# Patient Record
Sex: Female | Born: 1935 | Race: White | Hispanic: No | Smoking: Never smoker
Health system: Southern US, Community
[De-identification: ages and names within clinical notes are randomized; demographics above are authoritative.]

## PROBLEM LIST (undated history)

## (undated) DIAGNOSIS — F419 Anxiety disorder, unspecified: Secondary | ICD-10-CM

## (undated) DIAGNOSIS — K219 Gastro-esophageal reflux disease without esophagitis: Secondary | ICD-10-CM

## (undated) DIAGNOSIS — C4491 Basal cell carcinoma of skin, unspecified: Secondary | ICD-10-CM

## (undated) DIAGNOSIS — T7840XA Allergy, unspecified, initial encounter: Secondary | ICD-10-CM

## (undated) DIAGNOSIS — M48061 Spinal stenosis, lumbar region without neurogenic claudication: Secondary | ICD-10-CM

## (undated) DIAGNOSIS — M1990 Unspecified osteoarthritis, unspecified site: Secondary | ICD-10-CM

## (undated) DIAGNOSIS — E7800 Pure hypercholesterolemia, unspecified: Secondary | ICD-10-CM

## (undated) DIAGNOSIS — I10 Essential (primary) hypertension: Secondary | ICD-10-CM

## (undated) DIAGNOSIS — E079 Disorder of thyroid, unspecified: Secondary | ICD-10-CM

## (undated) DIAGNOSIS — IMO0002 Reserved for concepts with insufficient information to code with codable children: Secondary | ICD-10-CM

## (undated) HISTORY — PX: CATARACT EXTRACTION: SUR2

## (undated) HISTORY — PX: THYROID LOBECTOMY: SHX420

## (undated) HISTORY — DX: Disorder of thyroid, unspecified: E07.9

## (undated) HISTORY — DX: Allergy, unspecified, initial encounter: T78.40XA

## (undated) HISTORY — DX: Reserved for concepts with insufficient information to code with codable children: IMO0002

## (undated) HISTORY — PX: TONSILLECTOMY: SUR1361

## (undated) HISTORY — DX: Spinal stenosis, lumbar region without neurogenic claudication: M48.061

## (undated) HISTORY — DX: Basal cell carcinoma of skin, unspecified: C44.91

## (undated) HISTORY — PX: TUBAL LIGATION: SHX77

---

## 1997-10-01 ENCOUNTER — Other Ambulatory Visit: Admission: RE | Admit: 1997-10-01 | Discharge: 1997-10-01 | Payer: Self-pay | Admitting: Gynecology

## 1998-05-14 DIAGNOSIS — I10 Essential (primary) hypertension: Secondary | ICD-10-CM

## 1998-05-14 DIAGNOSIS — E7800 Pure hypercholesterolemia, unspecified: Secondary | ICD-10-CM

## 1998-05-14 HISTORY — DX: Essential (primary) hypertension: I10

## 1998-05-14 HISTORY — DX: Pure hypercholesterolemia, unspecified: E78.00

## 1998-12-21 ENCOUNTER — Other Ambulatory Visit: Admission: RE | Admit: 1998-12-21 | Discharge: 1998-12-21 | Payer: Self-pay | Admitting: Obstetrics and Gynecology

## 1999-11-30 ENCOUNTER — Encounter: Payer: Self-pay | Admitting: Obstetrics and Gynecology

## 1999-11-30 ENCOUNTER — Encounter: Admission: RE | Admit: 1999-11-30 | Discharge: 1999-11-30 | Payer: Self-pay | Admitting: Obstetrics and Gynecology

## 1999-12-06 ENCOUNTER — Encounter: Payer: Self-pay | Admitting: Obstetrics and Gynecology

## 1999-12-06 ENCOUNTER — Encounter: Admission: RE | Admit: 1999-12-06 | Discharge: 1999-12-06 | Payer: Self-pay | Admitting: Obstetrics and Gynecology

## 2000-01-02 ENCOUNTER — Other Ambulatory Visit: Admission: RE | Admit: 2000-01-02 | Discharge: 2000-01-02 | Payer: Self-pay | Admitting: Obstetrics and Gynecology

## 2000-01-03 ENCOUNTER — Encounter: Payer: Self-pay | Admitting: Obstetrics and Gynecology

## 2000-01-03 ENCOUNTER — Encounter: Admission: RE | Admit: 2000-01-03 | Discharge: 2000-01-03 | Payer: Self-pay | Admitting: Obstetrics and Gynecology

## 2000-12-10 ENCOUNTER — Encounter: Payer: Self-pay | Admitting: Obstetrics and Gynecology

## 2000-12-10 ENCOUNTER — Encounter: Admission: RE | Admit: 2000-12-10 | Discharge: 2000-12-10 | Payer: Self-pay | Admitting: Obstetrics and Gynecology

## 2001-01-02 ENCOUNTER — Other Ambulatory Visit: Admission: RE | Admit: 2001-01-02 | Discharge: 2001-01-02 | Payer: Self-pay | Admitting: Obstetrics and Gynecology

## 2001-12-31 ENCOUNTER — Encounter: Payer: Self-pay | Admitting: Obstetrics and Gynecology

## 2001-12-31 ENCOUNTER — Encounter: Admission: RE | Admit: 2001-12-31 | Discharge: 2001-12-31 | Payer: Self-pay | Admitting: Obstetrics and Gynecology

## 2003-01-12 ENCOUNTER — Encounter: Admission: RE | Admit: 2003-01-12 | Discharge: 2003-01-12 | Payer: Self-pay | Admitting: Obstetrics and Gynecology

## 2003-01-12 ENCOUNTER — Encounter: Payer: Self-pay | Admitting: Obstetrics and Gynecology

## 2003-03-17 ENCOUNTER — Encounter: Admission: RE | Admit: 2003-03-17 | Discharge: 2003-03-17 | Payer: Self-pay | Admitting: Obstetrics and Gynecology

## 2004-02-21 ENCOUNTER — Encounter: Admission: RE | Admit: 2004-02-21 | Discharge: 2004-02-21 | Payer: Self-pay | Admitting: Obstetrics and Gynecology

## 2004-03-13 ENCOUNTER — Ambulatory Visit (HOSPITAL_COMMUNITY): Admission: RE | Admit: 2004-03-13 | Discharge: 2004-03-13 | Payer: Self-pay | Admitting: Gastroenterology

## 2004-03-13 ENCOUNTER — Encounter (INDEPENDENT_AMBULATORY_CARE_PROVIDER_SITE_OTHER): Payer: Self-pay | Admitting: Specialist

## 2005-02-02 ENCOUNTER — Other Ambulatory Visit: Admission: RE | Admit: 2005-02-02 | Discharge: 2005-02-02 | Payer: Self-pay | Admitting: Dermatology

## 2005-03-15 ENCOUNTER — Encounter: Admission: RE | Admit: 2005-03-15 | Discharge: 2005-03-15 | Payer: Self-pay | Admitting: Obstetrics and Gynecology

## 2006-04-11 ENCOUNTER — Encounter: Admission: RE | Admit: 2006-04-11 | Discharge: 2006-04-11 | Payer: Self-pay | Admitting: Obstetrics and Gynecology

## 2007-04-17 ENCOUNTER — Encounter: Admission: RE | Admit: 2007-04-17 | Discharge: 2007-04-17 | Payer: Self-pay | Admitting: Obstetrics and Gynecology

## 2008-04-20 ENCOUNTER — Encounter: Admission: RE | Admit: 2008-04-20 | Discharge: 2008-04-20 | Payer: Self-pay | Admitting: Obstetrics and Gynecology

## 2008-12-23 ENCOUNTER — Ambulatory Visit (HOSPITAL_COMMUNITY): Admission: RE | Admit: 2008-12-23 | Discharge: 2008-12-23 | Payer: Self-pay | Admitting: Ophthalmology

## 2009-04-21 ENCOUNTER — Encounter: Admission: RE | Admit: 2009-04-21 | Discharge: 2009-04-21 | Payer: Self-pay | Admitting: Obstetrics and Gynecology

## 2010-03-14 ENCOUNTER — Encounter
Admission: RE | Admit: 2010-03-14 | Discharge: 2010-05-11 | Payer: Self-pay | Source: Home / Self Care | Attending: Physical Medicine and Rehabilitation | Admitting: Physical Medicine and Rehabilitation

## 2010-04-25 ENCOUNTER — Encounter
Admission: RE | Admit: 2010-04-25 | Discharge: 2010-04-25 | Payer: Self-pay | Source: Home / Self Care | Attending: Obstetrics and Gynecology | Admitting: Obstetrics and Gynecology

## 2010-05-14 DIAGNOSIS — M48061 Spinal stenosis, lumbar region without neurogenic claudication: Secondary | ICD-10-CM

## 2010-05-14 DIAGNOSIS — K219 Gastro-esophageal reflux disease without esophagitis: Secondary | ICD-10-CM

## 2010-05-14 HISTORY — DX: Spinal stenosis, lumbar region without neurogenic claudication: M48.061

## 2010-05-14 HISTORY — DX: Gastro-esophageal reflux disease without esophagitis: K21.9

## 2010-06-04 ENCOUNTER — Encounter: Payer: Self-pay | Admitting: Obstetrics and Gynecology

## 2010-08-20 LAB — BASIC METABOLIC PANEL
Calcium: 9.7 mg/dL (ref 8.4–10.5)
GFR calc non Af Amer: >60 mL/min (ref >60)
Glucose, Bld: 92 mg/dL (ref 70–99)
Potassium: 4.2 mEq/L (ref 3.5–5.1)
Sodium: 140 mEq/L (ref 135–145)

## 2010-08-20 LAB — HEMOGLOBIN AND HEMATOCRIT, BLOOD: HCT: 40.4 % (ref 36.0–46.0)

## 2010-09-26 ENCOUNTER — Other Ambulatory Visit: Payer: Self-pay | Admitting: Orthopedic Surgery

## 2010-09-26 DIAGNOSIS — M549 Dorsalgia, unspecified: Secondary | ICD-10-CM

## 2010-09-26 DIAGNOSIS — M79604 Pain in right leg: Secondary | ICD-10-CM

## 2010-09-27 ENCOUNTER — Ambulatory Visit
Admission: RE | Admit: 2010-09-27 | Discharge: 2010-09-27 | Disposition: A | Discharge status: Home / Self Care | Payer: Medicare Other | Source: Ambulatory Visit | Attending: Orthopedic Surgery | Admitting: Orthopedic Surgery

## 2010-09-27 DIAGNOSIS — M549 Dorsalgia, unspecified: Secondary | ICD-10-CM

## 2010-09-27 DIAGNOSIS — M79604 Pain in right leg: Secondary | ICD-10-CM

## 2010-09-29 NOTE — Op Note (Signed)
NAME:  Shelly Nelson, Shelly Nelson              ACCOUNT NO.:  [id number]   MEDICAL RECORD NO.:  [id number]          PATIENT TYPE:  AMB   LOCATION:  ENDO                         FACILITY:  [facility]   PHYSICIAN:  John C. Madilyn Fireman, M.D.    DATE OF BIRTH:  22-Feb-1930   DATE OF PROCEDURE:  03/13/2004  DATE OF DISCHARGE:                                 OPERATIVE REPORT   INDICATIONS FOR PROCEDURE:  Average-risk colon cancer screening in a 77-year-  old patient.   PROCEDURE:  The patient was placed in the left lateral decubitus position  and placed on the pulse monitor with continuous low flow oxygen delivered by  nasal cannula. The patient was sedated with 75 mcg of IV fentanyl and 8 mg  IV Versed. The Olympus video colonoscope was inserted into the rectum and  advanced to the cecum, confirmed by translumination of McBurney's point and  visualization of ileocecal valve and appendiceal orifice. The prep was  suboptimal in the cecum, and I could not rule out small lesions less than 1  cm in all areas there. In the ascending colon, there was an 8-mm polyp that  was removed by snare with base fulgurated by hot biopsy. Remainder of the  ascending and transverse colon appeared normal. Within the descending and  sigmoid colon, there were seen several scattered diverticula and no other  abnormalities. The rectum appeared normal, and retroflexed view of the anus  revealed no obvious internal hemorrhoids. The scope was then withdrawn, and  the patient returned to the recovery room in stable condition. The patient  tolerated the procedure well, and there were no immediate complications.   IMPRESSION:  1.  Ascending colon polyp.  2.  Left sided diverticulosis.   PLAN:  Await histology to determine method and interval for future colon  screening.      JCH/MEDQ  D:  03/13/2004  T:  03/13/2004  Job:  [id number]   cc:   S. Kyra Manges, M.D.  [phone] [address]  [place]  [place] [address]  Fax: [phone]

## 2010-11-02 ENCOUNTER — Other Ambulatory Visit: Payer: Self-pay | Admitting: Orthopedic Surgery

## 2010-11-02 ENCOUNTER — Other Ambulatory Visit (HOSPITAL_COMMUNITY): Payer: Self-pay | Admitting: Orthopedic Surgery

## 2010-11-02 ENCOUNTER — Encounter (HOSPITAL_COMMUNITY): Payer: Medicare Other

## 2010-11-02 ENCOUNTER — Ambulatory Visit (HOSPITAL_COMMUNITY)
Admission: RE | Admit: 2010-11-02 | Discharge: 2010-11-02 | Disposition: A | Discharge status: Home / Self Care | Payer: Medicare Other | Source: Ambulatory Visit | Attending: Orthopedic Surgery | Admitting: Orthopedic Surgery

## 2010-11-02 DIAGNOSIS — Z01818 Encounter for other preprocedural examination: Secondary | ICD-10-CM

## 2010-11-02 DIAGNOSIS — Q7649 Other congenital malformations of spine, not associated with scoliosis: Secondary | ICD-10-CM | POA: Insufficient documentation

## 2010-11-02 DIAGNOSIS — M4800 Spinal stenosis, site unspecified: Secondary | ICD-10-CM | POA: Insufficient documentation

## 2010-11-02 DIAGNOSIS — Z01811 Encounter for preprocedural respiratory examination: Secondary | ICD-10-CM | POA: Insufficient documentation

## 2010-11-02 LAB — DIFFERENTIAL
Basophils Relative: 1 % (ref 0–1)
Eosinophils Absolute: 0.2 10*3/uL (ref 0.0–0.7)
Eosinophils Relative: 2 % (ref 0–5)
Lymphs Abs: 2.1 10*3/uL (ref 0.7–4.0)
Monocytes Relative: 9 % (ref 3–12)
Neutrophils Relative %: 64 % (ref 43–77)

## 2010-11-02 LAB — URINALYSIS, ROUTINE W REFLEX MICROSCOPIC
Bilirubin Urine: NEGATIVE
Ketones, ur: NEGATIVE mg/dL
Leukocytes, UA: NEGATIVE
Nitrite: NEGATIVE
Urobilinogen, UA: 1 mg/dL (ref 0.0–1.0)

## 2010-11-02 LAB — COMPREHENSIVE METABOLIC PANEL
BUN: 10 mg/dL (ref 6–23)
CO2: 28 mEq/L (ref 19–32)
Calcium: 9.7 mg/dL (ref 8.4–10.5)
Chloride: 104 mEq/L (ref 96–112)
Creatinine, Ser: 0.9 mg/dL (ref 0.50–1.10)
GFR calc Af Amer: >60 mL/min (ref >60)
GFR calc non Af Amer: >60 mL/min (ref >60)
Glucose, Bld: 122 mg/dL — ABNORMAL HIGH (ref 70–99)
Total Bilirubin: 0.4 mg/dL (ref 0.3–1.2)

## 2010-11-02 LAB — CBC
MCH: 30.3 pg (ref 26.0–34.0)
MCV: 91 fL (ref 78.0–100.0)
Platelets: 212 10*3/uL (ref 150–400)
RBC: 4.65 MIL/uL (ref 3.87–5.11)
RDW: 12.3 % (ref 11.5–15.5)
WBC: 8.5 10*3/uL (ref 4.0–10.5)

## 2010-11-02 LAB — SURGICAL PCR SCREEN
MRSA, PCR: NEGATIVE
Staphylococcus aureus: NEGATIVE

## 2010-11-08 ENCOUNTER — Ambulatory Visit (HOSPITAL_COMMUNITY): Payer: Medicare Other

## 2010-11-08 ENCOUNTER — Inpatient Hospital Stay (HOSPITAL_COMMUNITY)
Admission: RE | Admit: 2010-11-08 | Discharge: 2010-11-10 | DRG: 491 | Disposition: A | Discharge status: Home / Self Care | Payer: Medicare Other | Source: Ambulatory Visit | Attending: Orthopedic Surgery | Admitting: Orthopedic Surgery

## 2010-11-08 DIAGNOSIS — M549 Dorsalgia, unspecified: Secondary | ICD-10-CM

## 2010-11-08 DIAGNOSIS — E785 Hyperlipidemia, unspecified: Secondary | ICD-10-CM | POA: Diagnosis present

## 2010-11-08 DIAGNOSIS — M48061 Spinal stenosis, lumbar region without neurogenic claudication: Principal | ICD-10-CM | POA: Diagnosis present

## 2010-11-08 DIAGNOSIS — I10 Essential (primary) hypertension: Secondary | ICD-10-CM | POA: Diagnosis present

## 2010-11-08 DIAGNOSIS — M216X9 Other acquired deformities of unspecified foot: Secondary | ICD-10-CM | POA: Diagnosis present

## 2010-11-08 DIAGNOSIS — K219 Gastro-esophageal reflux disease without esophagitis: Secondary | ICD-10-CM | POA: Diagnosis present

## 2010-11-08 DIAGNOSIS — M5126 Other intervertebral disc displacement, lumbar region: Secondary | ICD-10-CM | POA: Diagnosis present

## 2010-11-08 DIAGNOSIS — F411 Generalized anxiety disorder: Secondary | ICD-10-CM | POA: Diagnosis present

## 2010-11-08 HISTORY — PX: BACK SURGERY: SHX140

## 2010-11-08 LAB — ABO/RH: ABO/RH(D): A POS

## 2010-11-08 LAB — TYPE AND SCREEN: ABO/RH(D): A POS

## 2010-11-08 NOTE — H&P (Addendum)
NAMEMAEGHAN, Shelly Nelson              ACCOUNT NO.:  [id number]  MEDICAL RECORD NO.:  [id number]  LOCATION:                                 FACILITY:  PHYSICIAN:  Georges Lynch. Hiroshi Krummel, M.D.DATE OF BIRTH:  April 09, 1930  DATE OF ADMISSION: DATE OF DISCHARGE:                             HISTORY & PHYSICAL   CHIEF COMPLAINTS:  Low back pain with radiation into the right leg.  BRIEF HISTORY:  Ms. Shelly Nelson has been followed by Dr. Darrelyn Hillock for worsening pain in her low back that radiates into the right leg.  She was sent for an MRI.  There are really no convincing finding on the MRI, so she was then sent for a CT myelogram that revealed severe spinal stenosis at L4-L5 as well as a central disk rupture at L5-S1 that migrates to the right which is where all her symptoms are.  She now presents for a central decompressive lumbar laminectomy at L4-L5, L5-S1 as well as a microdiskectomy at L5-S1 on the right.  MEDICATION ALLERGIES:  No known drug allergies.  CURRENT MEDICATIONS: 1. Atenolol. 2. Premarin. 3. Lipitor. 4. Calcium. 5. Folic acid. 6. Metamucil. 7. Multivitamin. 8. Hydrocodone.  PAST MEDICAL HISTORY: 1. Low back pain with radiculopathy down the right leg. 2. Anxiety. 3. Impaired vision. 4. Cataracts. 5. Hypertension. 6. Hyperlipidemia. 7. Reflux disease. 8. Hemorrhoids. 9. Urinary incontinence. 10.Cystitis. 11.History of skin cancer. 12.History of measles and mumps as a child. 13.Arthritis. 14.History of menopause.  PAST SURGICAL HISTORY: 1. Tonsillectomy in 1938. 2. Tubal ligation in 1980. 3. Partial thyroidectomy in 1983.  The patient states she has never had any trouble with anesthesia.  FAMILY HISTORY:  Father passed the age of 65, he had COPD.  Mother passed at age of 39, she had breast cancer.  SOCIAL HISTORY:  The patient is married.  She is retired.  She denies use of alcohol or tobacco products.  She has one child.  She lives at home with her husband.   She does plan to go home following her hospital stay.  REVIEW OF SYSTEMS:  GENERAL:  Negative for fevers, chills or weight change.  HEENT/NEURO:  Negative for headache or blurred vision. DERMATOLOGIC: Negative for rash or lesion. RESPIRATORY:  Negative for shortness of breath at rest or with exertion.  CARDIOVASCULAR:  Negative for chest pain or palpitations.  Most recent EKG was Oct 12, 2010.  GI: The patient admits constipation and heartburn.  GU:  Urinary incontinence.  MUSCULOSKELETAL:  Positive for back pain and morning stiffness.  PHYSICAL EXAMINATION:  VITAL SIGNS:  Pulse 84, respirations 18, blood pressure 150/90 in the left arm. GENERAL:  Ms. Shelly Nelson is alert and oriented x3.  She is well developed, well nourished, no apparent distress.  She is a pleasant 77 year old female.  She has a stated height of 5 feet 5 inches and a stated weight of 131 pounds. HEENT:  Normocephalic, atraumatic.  Extraocular movements intact.  The patient wears glasses. NECK:  Supple.  Full range of motion without lymphadenopathy. CHEST:  Lungs are clear to auscultation bilaterally without wheezes, rhonchi, or rales. HEART:  Regular rate and rhythm without murmur. ABDOMEN:  Bowel sounds present in all 4  quadrants.  Abdomen is soft, nontender to palpation. EXTREMITIES:  Evaluation of the lumbar spine reveals painful range of motion of the low back in all planes.  She has positive straight leg raise on the right. SKIN:  Unremarkable. NEUROLOGIC:  Grossly intact in lower extremities bilaterally.  Again CT myelogram results as noted previously.  IMPRESSION:  Spinal stenosis at L4-L5 and central disk rupture at L5-S1 that migrates to the right.  PLAN:  Central decompressive lumbar laminectomy at L4-L5, L5-S1 and microdiskectomy at L5-S1 on the right to be performed by Dr. Darrelyn Hillock. Ms. Shelly Nelson has been cleared for surgery by Dr. Herbie Baltimore.     Rozell Searing,  PAC   ______________________________ Georges Lynch Darrelyn Hillock, M.D.    LD/MEDQ  D:  11/05/2010  T:  11/05/2010  Job:  [id number]  Electronically Signed by Rozell Searing  on 11/08/2010 03:19:18 PM Electronically Signed by Ranee Gosselin M.D. on 11/18/2010 08:19:17 AM

## 2010-11-10 LAB — HEMOGLOBIN AND HEMATOCRIT, BLOOD
HCT: 33.2 % — ABNORMAL LOW (ref 36.0–46.0)
Hemoglobin: 11.1 g/dL — ABNORMAL LOW (ref 12.0–15.0)

## 2010-11-14 ENCOUNTER — Ambulatory Visit (HOSPITAL_COMMUNITY)
Admission: RE | Admit: 2010-11-14 | Discharge: 2010-11-14 | Disposition: A | Discharge status: Home / Self Care | Payer: Medicare Other | Source: Ambulatory Visit | Attending: Orthopedic Surgery | Admitting: Orthopedic Surgery

## 2010-11-14 DIAGNOSIS — M7989 Other specified soft tissue disorders: Secondary | ICD-10-CM

## 2010-11-14 DIAGNOSIS — R252 Cramp and spasm: Secondary | ICD-10-CM | POA: Insufficient documentation

## 2010-11-18 NOTE — Discharge Summary (Signed)
  Shelly Nelson, DURNEY NO.:  [id number]  MEDICAL RECORD NO.:  [id number]  LOCATION:  1614                         FACILITY:  [facility]  PHYSICIAN:  Georges Lynch. Moria Brophy, M.D.DATE OF BIRTH:  1929-11-14  DATE OF ADMISSION:  11/08/2010 DATE OF DISCHARGE:  11/10/2010                              DISCHARGE SUMMARY   She was taken to surgery on November 08, 2010, had decompressive lumbar laminectomy, foraminotomies for spinal stenosis at L4-5, L5-S1.  She had severe right leg pain and weakness of right foot preop.  Postop, she did extremely well.  On November 09, 2010, she was feeling much better.  She was up ambulating with a walker with assistance.  On November 10, 2010, I elected to discharge her.  Wound looked fine.  She was afebrile.  Surgical screening for bacteria were negative.  The platelet count was 212, white count 8500, hemoglobin 14.1, hematocrit 42.3 with a normal differential.  Her C metabolic was normal except glucose was slightly elevated at 1.2.  The remaining studies were within normal limits.  The INR was 0.97.  PTT was 30.  The urinalysis was within normal limits. The chest x-ray was normal.  She had lumbar spine x-rays and a myelogram were all recorded.  Her EKG was read as right superior axis deviation. There also was a question of bilateral infarct, age undetermined.  FINAL DISCHARGE DIAGNOSIS:  Spinal stenosis at L4-5, L5-S1.  CONDITION ON DISCHARGE:  Improved.  DISCHARGE MEDICATIONS:  It was written all in the discharge manager except the medicine that I discharged her home for.  It was: 1. Robaxin for muscle relaxation, she is to take one t.i.d. p.r.n. for     spasms. 2. Percocet, I gave her 40 of those 10/650 one every 4 hours p.r.n.     for pain.  DISCHARGE INSTRUCTIONS:  She is to ambulate with a walker.  She will see me in the office 2 weeks or prior if she has any problem.          ______________________________ Georges Lynch Darrelyn Hillock,  M.D.     RAG/MEDQ  D:  11/10/2010  T:  11/10/2010  Job:  [id number]  cc:   Windy Fast A. Darrelyn Hillock, M.D. Fax: [phone]  Electronically Signed by Ranee Gosselin M.D. on 11/18/2010 08:19:15 AM

## 2010-11-18 NOTE — Op Note (Signed)
NAMERANAY, KETTER NO.:  [id number]  MEDICAL RECORD NO.:  [id number]  LOCATION:  DAYL                         FACILITY:  [facility]  PHYSICIAN:  Georges Lynch. Zacharius Funari, M.D.DATE OF BIRTH:  [birth year]  DATE OF PROCEDURE: DATE OF DISCHARGE:                              OPERATIVE REPORT   SURGEON:  Ninoska Goswick A. Darrelyn Hillock, M.D.  ASSISTANT:  Marlowe Kays, M.D.  PREOPERATIVE DIAGNOSES: 1. Spinal stenosis at L4-L5. 2. Spinal stenosis at L5-S1. 3. Rule out the possibility of a soft versus herniated disk formation     at L5-S1 on the right.  All of her symptoms were on the right.  POSTOPERATIVE DIAGNOSES: 1. Spinal stenosis at L4-L5. 2. Spinal stenosis at L5-S1. 3. Rule out the possibility of a soft versus facet overgrowth.  All of her symptoms were on the right.  PREPARATION:  Under general anesthesia, the patient on spinal frame, routine orthopedic prep and drape was carried out.  She had 1 gram of IV Ancef.  At this time, the appropriate time-out was carried out in the operating room prior to any incisions.  Also, in the holding area, even though we went central, I marked the right side of her back as her symptoms were on the right lower extremity.  DESCRIPTION OF PROCEDURE:  Under general anesthesia, routine orthopedic prep and drape was carried out.  I then inserted two needles in the back for localization purposes and x-ray was taken.  Incision then was made over the L4-L5, L5-S1 interspace, then I went down and stripped the muscle from the lamina and spinous process bilaterally.  Two instruments were placed on the spinous processes at L4 and one at L5.  Another x-ray was taken.  Following that, I then began my decompressive lumbar laminectomy by removing the spinous processes of L4-L5.  Great care taken not to injure the underlying dura.  The microscope table was brought in and we completed the decompression in the usual fashion of L5- S1 and advanced  proximally to L4-L5 until we noted the canal was wide open.  We were able to easily go proximally and distally with a hockey- stick to make sure that there was no further compression proximally or distally.  We did use the microscope as I mentioned.  The dura was protected at all times.  There was marked lateral recess stenosis, especially at L5-S1.  We went ahead and decompressed the lateral recess and exposed the nerve roots and they were now free.  We able to easily pass the hockey-stick out the foramina of the roots of L-L5, L5-S1. Note, there was no soft herniated disk noted.  Following that, we, after the thorough decompression, once again we inspected the dura proximally and distally in the foramina and we as mentioned we had nice decompression.  We did try to preserve the facets at this time. Thoroughly irrigated out the wound.  I injected 10 mL of FloSeal and then removed the FloSeal from the dura, left it into the lateral facet regions and then closed the wound layers in usual fashion.  We left the small deep distal and proximal part of the wound open for drainage purposes and we closed the  muscle in the usual fashion.  We closed the subcu with 0 Vicryl and skin with metal staples.  Sterile Neosporin dressing was applied.  Estimated blood loss was about 200 mL and we had good hemostasis at the time of closure and 1 gram of IV Ancef preop.          ______________________________ Georges Lynch. Darrelyn Hillock, M.D.     RAG/MEDQ  D:  11/08/2010  T:  11/08/2010  Job:  [id number]  cc:   Windy Fast A. Darrelyn Hillock, M.D. Fax: [phone]  Landry Corporal, MD Fax: [phone]  Electronically Signed by Ranee Gosselin M.D. on 11/18/2010 08:19:20 AM

## 2010-12-22 ENCOUNTER — Other Ambulatory Visit: Payer: Self-pay | Admitting: Gastroenterology

## 2010-12-25 ENCOUNTER — Ambulatory Visit
Admission: RE | Admit: 2010-12-25 | Discharge: 2010-12-25 | Disposition: A | Discharge status: Home / Self Care | Payer: Medicare Other | Source: Ambulatory Visit | Attending: Gastroenterology | Admitting: Gastroenterology

## 2010-12-27 ENCOUNTER — Other Ambulatory Visit: Payer: Self-pay | Admitting: Gastroenterology

## 2010-12-27 DIAGNOSIS — R933 Abnormal findings on diagnostic imaging of other parts of digestive tract: Secondary | ICD-10-CM

## 2010-12-29 ENCOUNTER — Ambulatory Visit
Admission: RE | Admit: 2010-12-29 | Discharge: 2010-12-29 | Disposition: A | Discharge status: Home / Self Care | Payer: Medicare Other | Source: Ambulatory Visit | Attending: Gastroenterology | Admitting: Gastroenterology

## 2010-12-29 DIAGNOSIS — R933 Abnormal findings on diagnostic imaging of other parts of digestive tract: Secondary | ICD-10-CM

## 2011-01-29 ENCOUNTER — Other Ambulatory Visit: Payer: Self-pay | Admitting: Internal Medicine

## 2011-01-29 DIAGNOSIS — E042 Nontoxic multinodular goiter: Secondary | ICD-10-CM

## 2011-01-31 ENCOUNTER — Ambulatory Visit
Admission: RE | Admit: 2011-01-31 | Discharge: 2011-01-31 | Disposition: A | Discharge status: Home / Self Care | Payer: Medicare Other | Source: Ambulatory Visit | Attending: Internal Medicine | Admitting: Internal Medicine

## 2011-01-31 ENCOUNTER — Other Ambulatory Visit (HOSPITAL_COMMUNITY)
Admission: RE | Admit: 2011-01-31 | Discharge: 2011-01-31 | Disposition: A | Discharge status: Home / Self Care | Payer: Medicare Other | Source: Ambulatory Visit | Attending: Interventional Radiology | Admitting: Interventional Radiology

## 2011-01-31 DIAGNOSIS — E042 Nontoxic multinodular goiter: Secondary | ICD-10-CM

## 2011-01-31 DIAGNOSIS — E049 Nontoxic goiter, unspecified: Secondary | ICD-10-CM | POA: Insufficient documentation

## 2011-03-20 ENCOUNTER — Other Ambulatory Visit: Payer: Self-pay | Admitting: Family Medicine

## 2011-03-20 DIAGNOSIS — Z1231 Encounter for screening mammogram for malignant neoplasm of breast: Secondary | ICD-10-CM

## 2011-04-27 ENCOUNTER — Ambulatory Visit
Admission: RE | Admit: 2011-04-27 | Discharge: 2011-04-27 | Disposition: A | Discharge status: Home / Self Care | Payer: Medicare Other | Source: Ambulatory Visit | Attending: Family Medicine | Admitting: Family Medicine

## 2011-04-27 DIAGNOSIS — Z1231 Encounter for screening mammogram for malignant neoplasm of breast: Secondary | ICD-10-CM

## 2011-05-15 DIAGNOSIS — E079 Disorder of thyroid, unspecified: Secondary | ICD-10-CM

## 2011-05-15 HISTORY — DX: Disorder of thyroid, unspecified: E07.9

## 2012-03-24 ENCOUNTER — Other Ambulatory Visit: Payer: Self-pay | Admitting: Family Medicine

## 2012-03-24 DIAGNOSIS — Z1231 Encounter for screening mammogram for malignant neoplasm of breast: Secondary | ICD-10-CM

## 2012-05-05 ENCOUNTER — Ambulatory Visit
Admission: RE | Admit: 2012-05-05 | Discharge: 2012-05-05 | Disposition: A | Discharge status: Home / Self Care | Payer: Medicare Other | Source: Ambulatory Visit | Attending: Family Medicine | Admitting: Family Medicine

## 2012-05-05 DIAGNOSIS — Z1231 Encounter for screening mammogram for malignant neoplasm of breast: Secondary | ICD-10-CM

## 2012-05-21 ENCOUNTER — Encounter (HOSPITAL_COMMUNITY): Payer: Self-pay | Admitting: Pharmacy Technician

## 2012-06-04 ENCOUNTER — Encounter (HOSPITAL_COMMUNITY)
Admission: RE | Admit: 2012-06-04 | Discharge: 2012-06-04 | Disposition: A | Discharge status: Home / Self Care | Payer: Medicare Other | Source: Ambulatory Visit | Attending: Ophthalmology | Admitting: Ophthalmology

## 2012-06-04 ENCOUNTER — Encounter (HOSPITAL_COMMUNITY): Payer: Self-pay

## 2012-06-04 HISTORY — DX: Gastro-esophageal reflux disease without esophagitis: K21.9

## 2012-06-04 HISTORY — DX: Unspecified osteoarthritis, unspecified site: M19.90

## 2012-06-04 HISTORY — DX: Essential (primary) hypertension: I10

## 2012-06-04 HISTORY — DX: Anxiety disorder, unspecified: F41.9

## 2012-06-04 HISTORY — DX: Pure hypercholesterolemia, unspecified: E78.00

## 2012-06-04 LAB — BASIC METABOLIC PANEL
CO2: 30 mEq/L (ref 19–32)
Chloride: 103 mEq/L (ref 96–112)
Glucose, Bld: 93 mg/dL (ref 70–99)
Potassium: 4.3 mEq/L (ref 3.5–5.1)
Sodium: 140 mEq/L (ref 135–145)

## 2012-06-04 LAB — HEMOGLOBIN AND HEMATOCRIT, BLOOD: HCT: 41.4 % (ref 36.0–46.0)

## 2012-06-04 NOTE — Patient Instructions (Addendum)
Your procedure is scheduled on: 06/09/2012  Report to [facility] at  1000    AM.  Call this number if you have problems the morning of surgery: [phone]   Do not eat food or drink liquids :After Midnight.      Take these medicines the morning of surgery with A SIP OF WATER:atenolol,prilosec   Do not wear jewelry, make-up or nail polish.  Do not wear lotions, powders, or perfumes.   Do not shave 48 hours prior to surgery.  Do not bring valuables to the hospital.  Contacts, dentures or bridgework may not be worn into surgery.  Leave suitcase in the car. After surgery it may be brought to your room.  For patients admitted to the hospital, checkout time is 11:00 AM the day of discharge.   Patients discharged the day of surgery will not be allowed to drive home.  :     Please read over the following fact sheets that you were given: Coughing and Deep Breathing, Surgical Site Infection Prevention, Anesthesia Post-op Instructions and Care and Recovery After Surgery    Cataract A cataract is a clouding of the lens of the eye. When a lens becomes cloudy, vision is reduced based on the degree and nature of the clouding. Many cataracts reduce vision to some degree. Some cataracts make people more near-sighted as they develop. Other cataracts increase glare. Cataracts that are ignored and become worse can sometimes look white. The white color can be seen through the pupil. CAUSES   Aging. However, cataracts may occur at any age, even in newborns.   Certain drugs.   Trauma to the eye.   Certain diseases such as diabetes.   Specific eye diseases such as chronic inflammation inside the eye or a sudden attack of a rare form of glaucoma.   Inherited or acquired medical problems.  SYMPTOMS   Gradual, progressive drop in vision in the affected eye.   Severe, rapid visual loss. This most often happens when trauma is the cause.  DIAGNOSIS  To detect a cataract, an eye doctor examines the lens.  Cataracts are best diagnosed with an exam of the eyes with the pupils enlarged (dilated) by drops.  TREATMENT  For an early cataract, vision may improve by using different eyeglasses or stronger lighting. If that does not help your vision, surgery is the only effective treatment. A cataract needs to be surgically removed when vision loss interferes with your everyday activities, such as driving, reading, or watching TV. A cataract may also have to be removed if it prevents examination or treatment of another eye problem. Surgery removes the cloudy lens and usually replaces it with a substitute lens (intraocular lens, IOL).  At a time when both you and your doctor agree, the cataract will be surgically removed. If you have cataracts in both eyes, only one is usually removed at a time. This allows the operated eye to heal and be out of danger from any possible problems after surgery (such as infection or poor wound healing). In rare cases, a cataract may be doing damage to your eye. In these cases, your caregiver may advise surgical removal right away. The vast majority of people who have cataract surgery have better vision afterward. HOME CARE INSTRUCTIONS  If you are not planning surgery, you may be asked to do the following:  Use different eyeglasses.   Use stronger or brighter lighting.   Ask your eye doctor about reducing your medicine dose or changing medicines  if it is thought that a medicine caused your cataract. Changing medicines does not make the cataract go away on its own.   Become familiar with your surroundings. Poor vision can lead to injury. Avoid bumping into things on the affected side. You are at a higher risk for tripping or falling.   Exercise extreme care when driving or operating machinery.   Wear sunglasses if you are sensitive to bright light or experiencing problems with glare.  SEEK IMMEDIATE MEDICAL CARE IF:   You have a worsening or sudden vision loss.   You notice  redness, swelling, or increasing pain in the eye.   You have a fever.  Document Released: 04/30/2005 Document Revised: 04/19/2011 Document Reviewed: 12/22/2010 [facility] Patient Information 2012 [place], [place].PATIENT INSTRUCTIONS POST-ANESTHESIA  IMMEDIATELY FOLLOWING SURGERY:  Do not drive or operate machinery for the first twenty four hours after surgery.  Do not make any important decisions for twenty four hours after surgery or while taking narcotic pain medications or sedatives.  If you develop intractable nausea and vomiting or a severe headache please notify your doctor immediately.  FOLLOW-UP:  Please make an appointment with your surgeon as instructed. You do not need to follow up with anesthesia unless specifically instructed to do so.  WOUND CARE INSTRUCTIONS (if applicable):  Keep a dry clean dressing on the anesthesia/puncture wound site if there is drainage.  Once the wound has quit draining you may leave it open to air.  Generally you should leave the bandage intact for twenty four hours unless there is drainage.  If the epidural site drains for more than 36-48 hours please call the anesthesia department.  QUESTIONS?:  Please feel free to call your physician or the hospital operator if you have any questions, and they will be happy to assist you.

## 2012-06-06 MED ORDER — NEOMYCIN-POLYMYXIN-DEXAMETH 3.5-10000-0.1 OP OINT
TOPICAL_OINTMENT | OPHTHALMIC | Status: AC
  Filled 2012-06-06: qty 3.5

## 2012-06-06 MED ORDER — TETRACAINE HCL 0.5 % OP SOLN
OPHTHALMIC | Status: AC
  Filled 2012-06-06: qty 2

## 2012-06-06 MED ORDER — LIDOCAINE HCL (PF) 1 % IJ SOLN
INTRAMUSCULAR | Status: AC
  Filled 2012-06-06: qty 2

## 2012-06-06 MED ORDER — CYCLOPENTOLATE-PHENYLEPHRINE 0.2-1 % OP SOLN
OPHTHALMIC | Status: AC
  Filled 2012-06-06: qty 2

## 2012-06-06 MED ORDER — LIDOCAINE HCL 3.5 % OP GEL
OPHTHALMIC | Status: AC
  Filled 2012-06-06: qty 5

## 2012-06-09 ENCOUNTER — Ambulatory Visit (HOSPITAL_COMMUNITY)
Admission: RE | Admit: 2012-06-09 | Discharge: 2012-06-09 | Disposition: A | Discharge status: Home / Self Care | Payer: Medicare Other | Source: Ambulatory Visit | Attending: Ophthalmology | Admitting: Ophthalmology

## 2012-06-09 ENCOUNTER — Ambulatory Visit (HOSPITAL_COMMUNITY): Payer: Medicare Other | Admitting: Anesthesiology

## 2012-06-09 ENCOUNTER — Encounter (HOSPITAL_COMMUNITY): Payer: Self-pay | Admitting: Anesthesiology

## 2012-06-09 ENCOUNTER — Encounter (HOSPITAL_COMMUNITY): Payer: Self-pay | Admitting: Ophthalmology

## 2012-06-09 ENCOUNTER — Encounter (HOSPITAL_COMMUNITY): Payer: Self-pay | Admitting: *Deleted

## 2012-06-09 ENCOUNTER — Encounter (HOSPITAL_COMMUNITY)
Admission: RE | Disposition: A | Discharge status: Home / Self Care | Payer: Self-pay | Source: Ambulatory Visit | Attending: Ophthalmology

## 2012-06-09 DIAGNOSIS — H2589 Other age-related cataract: Secondary | ICD-10-CM | POA: Insufficient documentation

## 2012-06-09 DIAGNOSIS — I10 Essential (primary) hypertension: Secondary | ICD-10-CM | POA: Insufficient documentation

## 2012-06-09 DIAGNOSIS — Z79899 Other long term (current) drug therapy: Secondary | ICD-10-CM | POA: Insufficient documentation

## 2012-06-09 DIAGNOSIS — Z01810 Encounter for preprocedural cardiovascular examination: Secondary | ICD-10-CM | POA: Insufficient documentation

## 2012-06-09 DIAGNOSIS — Z01812 Encounter for preprocedural laboratory examination: Secondary | ICD-10-CM | POA: Insufficient documentation

## 2012-06-09 HISTORY — PX: CATARACT EXTRACTION W/PHACO: SHX586

## 2012-06-09 SURGERY — PHACOEMULSIFICATION, CATARACT, WITH IOL INSERTION
Anesthesia: Monitor Anesthesia Care | Site: Eye | Laterality: Left | Wound class: Clean

## 2012-06-09 MED ORDER — MIDAZOLAM HCL 2 MG/2ML IJ SOLN
1.0000 mg | INTRAMUSCULAR | Status: DC | PRN
  Administered 2012-06-09: 2 mg via INTRAVENOUS

## 2012-06-09 MED ORDER — POVIDONE-IODINE 5 % OP SOLN
OPHTHALMIC | Status: DC | PRN
  Administered 2012-06-09: 1 via OPHTHALMIC

## 2012-06-09 MED ORDER — PROVISC 10 MG/ML IO SOLN
INTRAOCULAR | Status: DC | PRN
  Administered 2012-06-09: 8.5 mg via INTRAOCULAR

## 2012-06-09 MED ORDER — BSS IO SOLN
INTRAOCULAR | Status: DC | PRN
  Administered 2012-06-09: 15 mL via INTRAOCULAR

## 2012-06-09 MED ORDER — NEOMYCIN-POLYMYXIN-DEXAMETH 0.1 % OP OINT
TOPICAL_OINTMENT | OPHTHALMIC | Status: DC | PRN
  Administered 2012-06-09: 1 via OPHTHALMIC

## 2012-06-09 MED ORDER — LACTATED RINGERS IV SOLN
INTRAVENOUS | Status: DC
  Administered 2012-06-09: 11:00:00 via INTRAVENOUS

## 2012-06-09 MED ORDER — CYCLOPENTOLATE-PHENYLEPHRINE 0.2-1 % OP SOLN
1.0000 [drp] | OPHTHALMIC | Status: AC
  Administered 2012-06-09 (×3): 1 [drp] via OPHTHALMIC

## 2012-06-09 MED ORDER — TETRACAINE HCL 0.5 % OP SOLN
1.0000 [drp] | OPHTHALMIC | Status: AC
  Administered 2012-06-09 (×3): 1 [drp] via OPHTHALMIC

## 2012-06-09 MED ORDER — PHENYLEPHRINE HCL 2.5 % OP SOLN
1.0000 [drp] | OPHTHALMIC | Status: AC
  Administered 2012-06-09 (×3): 1 [drp] via OPHTHALMIC

## 2012-06-09 MED ORDER — EPINEPHRINE HCL 1 MG/ML IJ SOLN
INTRAOCULAR | Status: DC | PRN
  Administered 2012-06-09: 12:00:00

## 2012-06-09 MED ORDER — MIDAZOLAM HCL 2 MG/2ML IJ SOLN
INTRAMUSCULAR | Status: AC
  Filled 2012-06-09: qty 2

## 2012-06-09 MED ORDER — LIDOCAINE HCL (PF) 1 % IJ SOLN
INTRAMUSCULAR | Status: DC | PRN
  Administered 2012-06-09: .2 mL

## 2012-06-09 MED ORDER — LIDOCAINE 3.5 % OP GEL OPTIME - NO CHARGE
OPHTHALMIC | Status: DC | PRN
  Administered 2012-06-09: 1 [drp] via OPHTHALMIC

## 2012-06-09 MED ORDER — LIDOCAINE HCL 3.5 % OP GEL
1.0000 "application " | Freq: Once | OPHTHALMIC | Status: AC
  Administered 2012-06-09: 1 via OPHTHALMIC

## 2012-06-09 MED ORDER — EPINEPHRINE HCL 1 MG/ML IJ SOLN
INTRAMUSCULAR | Status: AC
  Filled 2012-06-09: qty 1

## 2012-06-09 SURGICAL SUPPLY — 11 items
CLOTH BEACON ORANGE TIMEOUT ST (SAFETY) ×1 IMPLANT
EYE SHIELD UNIVERSAL CLEAR (GAUZE/BANDAGES/DRESSINGS) ×1 IMPLANT
GLOVE BIOGEL PI IND STRL 6.5 (GLOVE) IMPLANT
GLOVE BIOGEL PI INDICATOR 6.5 (GLOVE) ×1
GLOVE EXAM NITRILE MD LF STRL (GLOVE) ×1 IMPLANT
PAD ARMBOARD 7.5X6 YLW CONV (MISCELLANEOUS) ×1 IMPLANT
SIGHTPATH CAT PROC W REG LENS (Ophthalmic Related) ×2 IMPLANT
SYR TB 1ML LL NO SAFETY (SYRINGE) ×1 IMPLANT
TAPE SURG TRANSPORE 1 IN (GAUZE/BANDAGES/DRESSINGS) IMPLANT
TAPE SURGICAL TRANSPORE 1 IN (GAUZE/BANDAGES/DRESSINGS) ×1
WATER STERILE IRR 250ML POUR (IV SOLUTION) ×1 IMPLANT

## 2012-06-09 NOTE — Brief Op Note (Signed)
Pre-Op Dx: Cataract OS Post-Op Dx: Cataract OS Surgeon: Gemma Payor Anesthesia: Topical with MAC Surgery: Cataract Extraction with Intraocular lens Implant OS Implant: Alcon Acrysof, [id number] Specimen: None Complications: None

## 2012-06-09 NOTE — Anesthesia Preprocedure Evaluation (Signed)
Anesthesia Evaluation  Patient identified by MRN, date of birth, ID band Patient awake    Reviewed: Allergy & Precautions, H&P , NPO status , Patient's Chart, lab work & pertinent test results, reviewed documented beta blocker date and time   Airway Mallampati: I TM Distance: >3 FB Neck ROM: Full    Dental  (+) Teeth Intact   Pulmonary neg pulmonary ROS,    Pulmonary exam normal       Cardiovascular hypertension, Pt. on medications and Pt. on home beta blockers Rhythm:Regular Rate:Normal     Neuro/Psych Anxiety negative neurological ROS     GI/Hepatic Neg liver ROS, GERD-  Medicated and Controlled,  Endo/Other  negative endocrine ROS  Renal/GU negative Renal ROS     Musculoskeletal  (+) Arthritis -, Osteoarthritis,    Abdominal Normal abdominal exam  (+)   Peds  Hematology negative hematology ROS (+)   Anesthesia Other Findings   Reproductive/Obstetrics                           Anesthesia Physical Anesthesia Plan  ASA: II  Anesthesia Plan: MAC   Post-op Pain Management:    Induction: Intravenous  Airway Management Planned: Nasal Cannula  Additional Equipment:   Intra-op Plan:   Post-operative Plan:   Informed Consent: I have reviewed the patients History and Physical, chart, labs and discussed the procedure including the risks, benefits and alternatives for the proposed anesthesia with the patient or authorized representative who has indicated his/her understanding and acceptance.     Plan Discussed with: CRNA  Anesthesia Plan Comments:         Anesthesia Quick Evaluation

## 2012-06-09 NOTE — Transfer of Care (Signed)
Immediate Anesthesia Transfer of Care Note  Patient: Shelly Nelson  Procedure(s) Performed: Procedure(s) (LRB) with comments: CATARACT EXTRACTION PHACO AND INTRAOCULAR LENS PLACEMENT (IOC) (Left) - CDE=13.91  Patient Location: Short Stay  Anesthesia Type:MAC  Level of Consciousness: awake and patient cooperative  Airway & Oxygen Therapy: Patient Spontanous Breathing  Post-op Assessment: Report given to PACU RN, Post -op Vital signs reviewed and stable and Patient moving all extremities  Post vital signs: Reviewed and stable  Complications: No apparent anesthesia complications

## 2012-06-09 NOTE — H&P (Signed)
I have reviewed the H&P, the patient was re-examined, and I have identified no interval changes in medical condition and plan of care since the history and physical of record  

## 2012-06-09 NOTE — Anesthesia Postprocedure Evaluation (Signed)
  Anesthesia Post-op Note  Patient: Shelly Nelson  Procedure(s) Performed: Procedure(s) (LRB) with comments: CATARACT EXTRACTION PHACO AND INTRAOCULAR LENS PLACEMENT (IOC) (Left) - CDE=13.91  Patient Location: Short Stay  Anesthesia Type:MAC  Level of Consciousness: awake, alert , oriented and patient cooperative  Airway and Oxygen Therapy: Patient Spontanous Breathing  Post-op Pain: none  Post-op Assessment: Post-op Vital signs reviewed, Patient's Cardiovascular Status Stable, Respiratory Function Stable, Patent Airway, No signs of Nausea or vomiting and Pain level controlled  Post-op Vital Signs: Reviewed and stable  Complications: No apparent anesthesia complications

## 2012-06-10 ENCOUNTER — Encounter (HOSPITAL_COMMUNITY): Payer: Self-pay | Admitting: Ophthalmology

## 2012-06-10 NOTE — Op Note (Signed)
NAME:  Shelly Nelson, Shelly Nelson              ACCOUNT NO.:  [id number]  MEDICAL RECORD NO.:  [id number]  LOCATION:  APPO                          FACILITY:  [facility]  PHYSICIAN:  Susanne Greenhouse, MD       DATE OF BIRTH:  Aug 27, 1929  DATE OF PROCEDURE:  06/09/2012 DATE OF DISCHARGE:  06/09/2012                              OPERATIVE REPORT   PREOPERATIVE DIAGNOSIS:  Combined cataract, left eye, diagnosis code 366.19.  POSTOPERATIVE DIAGNOSES:  Combined cataract, left eye, diagnosis code 366.19.  ANESTHESIA:  Topical with monitored anesthesia care and IV sedation.  OPERATIVE SUMMARY:  In the preoperative area, dilating drops were placed into the left eye.  The patient was then brought into the operating room where she was placed under general anesthesia.  The eye was then prepped and draped.  Beginning with a 75 blade, a paracentesis port was made at the surgeon's 2 o'clock position.  The anterior chamber was then filled with a 1% nonpreserved lidocaine solution with epinephrine.  This was followed by Viscoat to deepen the chamber.  A small fornix-based peritomy was performed superiorly.  Next, a single iris hook was placed through the limbus superiorly.  A 2.4-mm keratome blade was then used to make a clear corneal incision over the iris hook.  A bent cystotome needle and Utrata forceps were used to create a continuous tear capsulotomy.  Hydrodissection was performed using balanced salt solution on a fine cannula.  The lens nucleus was then removed using phacoemulsification in a quadrant cracking technique.  The cortical material was then removed with irrigation and aspiration.  The capsular bag and anterior chamber were refilled with Provisc.  The wound was widened to approximately 3 mm and a posterior chamber intraocular lens was placed into the capsular bag without difficulty using an [employer] lens injecting system.  A single 10-0 nylon suture was then used to close the incision as well as  stromal hydration.  The Provisc was removed from the anterior chamber and capsular bag with irrigation and aspiration.  At this point, the wounds were tested for leak, which were negative.  The anterior chamber remained deep and stable.  The patient tolerated the procedure well.  There were no operative complications, and she awoke from general anesthesia without problem.  No surgical specimens.  Prosthetic device used is an Alcon AcrySof posterior chamber lens, model SN60WF, power of 19.5, serial number is [id number].          ______________________________ Susanne Greenhouse, MD     KEH/MEDQ  D:  06/09/2012  T:  06/10/2012  Job:  [id number]

## 2012-07-31 ENCOUNTER — Ambulatory Visit
Admission: RE | Admit: 2012-07-31 | Discharge: 2012-07-31 | Disposition: A | Discharge status: Home / Self Care | Payer: Medicare Other | Source: Ambulatory Visit | Attending: Internal Medicine | Admitting: Internal Medicine

## 2012-07-31 ENCOUNTER — Other Ambulatory Visit: Payer: Self-pay | Admitting: Internal Medicine

## 2012-07-31 DIAGNOSIS — E049 Nontoxic goiter, unspecified: Secondary | ICD-10-CM

## 2012-09-09 ENCOUNTER — Ambulatory Visit (INDEPENDENT_AMBULATORY_CARE_PROVIDER_SITE_OTHER): Payer: Medicare Other | Admitting: Family Medicine

## 2012-09-09 ENCOUNTER — Encounter: Payer: Self-pay | Admitting: Family Medicine

## 2012-09-09 VITALS — BP 130/75 | HR 63 | Temp 97.4°F | Ht 64.0 in | Wt 134.4 lb

## 2012-09-09 DIAGNOSIS — IMO0001 Reserved for inherently not codable concepts without codable children: Secondary | ICD-10-CM

## 2012-09-09 DIAGNOSIS — K5730 Diverticulosis of large intestine without perforation or abscess without bleeding: Secondary | ICD-10-CM | POA: Insufficient documentation

## 2012-09-09 DIAGNOSIS — I10 Essential (primary) hypertension: Secondary | ICD-10-CM | POA: Insufficient documentation

## 2012-09-09 DIAGNOSIS — E042 Nontoxic multinodular goiter: Secondary | ICD-10-CM | POA: Insufficient documentation

## 2012-09-09 DIAGNOSIS — N952 Postmenopausal atrophic vaginitis: Secondary | ICD-10-CM

## 2012-09-09 DIAGNOSIS — E785 Hyperlipidemia, unspecified: Secondary | ICD-10-CM | POA: Insufficient documentation

## 2012-09-09 DIAGNOSIS — K219 Gastro-esophageal reflux disease without esophagitis: Secondary | ICD-10-CM | POA: Insufficient documentation

## 2012-09-09 DIAGNOSIS — Z780 Asymptomatic menopausal state: Secondary | ICD-10-CM | POA: Insufficient documentation

## 2012-09-09 LAB — BASIC METABOLIC PANEL WITH GFR
BUN: 9 mg/dL (ref 6–23)
CO2: 28 mEq/L (ref 19–32)
Calcium: 9.6 mg/dL (ref 8.4–10.5)
Chloride: 104 mEq/L (ref 96–112)
Creat: 1 mg/dL (ref 0.50–1.10)
GFR, Est African American: 61 mL/min
GFR, Est Non African American: 53 mL/min — ABNORMAL LOW
Glucose, Bld: 102 mg/dL — ABNORMAL HIGH (ref 70–99)
Potassium: 4.8 mEq/L (ref 3.5–5.3)
Sodium: 141 mEq/L (ref 135–145)

## 2012-09-09 LAB — HEPATIC FUNCTION PANEL
ALT: 26 U/L (ref 0–35)
AST: 33 U/L (ref 0–37)
Albumin: 4.3 g/dL (ref 3.5–5.2)
Alkaline Phosphatase: 53 U/L (ref 39–117)
Bilirubin, Direct: 0.1 mg/dL (ref 0.0–0.3)
Indirect Bilirubin: 0.3 mg/dL (ref 0.0–0.9)
Total Bilirubin: 0.4 mg/dL (ref 0.3–1.2)
Total Protein: 6.3 g/dL (ref 6.0–8.3)

## 2012-09-09 NOTE — Progress Notes (Signed)
Patient ID: Shelly Nelson, female   DOB: 17-Apr-1930, 79 y.o.   MRN: [id number]  SUBJECTIVE: HPI: Patient is here for follow up of hyperlipidemia/hypertension/gerd/ menopause: denies Headache;denies Chest Pain;denies weakness;denies Shortness of Breath and orthopnea;denies Visual changes;denies palpitations;denies cough;denies pedal edema;denies symptoms of TIA or stroke;deniesClaudication symptoms. admits to Compliance with medications; denies Problems with medications.  Past Medical History  Diagnosis Date  . Hypertension   . GERD (gastroesophageal reflux disease)   . Hypercholesteremia   . Anxiety   . Arthritis    Past Surgical History  Procedure Laterality Date  . Tonsillectomy      age 37  . Thyroid lobectomy    . Back surgery      lumbar-bulgiing disc  . Tubal ligation    . Cataract extraction      right eye-2011-Dr Hunt  . Cataract extraction w/phaco  06/09/2012    Procedure: CATARACT EXTRACTION PHACO AND INTRAOCULAR LENS PLACEMENT (IOC);  Surgeon: Gemma Payor, MD;  Location: [facility];  Service: Ophthalmology;  Laterality: Left;  CDE=13.91   Current Outpatient Prescriptions on File Prior to Visit  Medication Sig Dispense Refill  . atenolol (TENORMIN) 25 MG tablet Take 25 mg by mouth daily.      . Calcium Carbonate-Vitamin D (CALCIUM-D) 600-400 MG-UNIT TABS Take 1 tablet by mouth daily.      Marland Kitchen conjugated estrogens (PREMARIN) vaginal cream Place 1 g vaginally 2 (two) times a week.      . folic acid (FOLVITE) 800 MCG tablet Take 800 mcg by mouth daily.      . magnesium oxide (MAG-OX) 400 MG tablet Take 400 mg by mouth 2 (two) times daily.      . Multiple Vitamin (MULTIVITAMIN WITH MINERALS) TABS Take 1 tablet by mouth daily.      . Omega-3 Fatty Acids (FISH OIL) 1200 MG CAPS Take 1,200 mg by mouth daily.      Marland Kitchen omeprazole (PRILOSEC) 40 MG capsule Take 40 mg by mouth daily.      . pravastatin (PRAVACHOL) 20 MG tablet Take 20 mg by mouth daily.      . psyllium (METAMUCIL) 58.6  % powder Take 1 packet by mouth 2 (two) times daily.       No current facility-administered medications on file prior to visit.   No Known Allergies Immunization History  Administered Date(s) Administered  . Pneumococcal Polysaccharide 05/14/2005  . Zoster 02/12/2008   History   Social History  . Marital Status: Married    Spouse Name: N/A    Number of Children: N/A  . Years of Education: N/A   Occupational History  . Not on file.   Social History Main Topics  . Smoking status: Never Smoker   . Smokeless tobacco: Not on file  . Alcohol Use: No  . Drug Use: No  . Sexually Active: Yes    Birth Control/ Protection: Surgical   Other Topics Concern  . Not on file   Social History Narrative  . No narrative on file     ROS: As above in the HPI. All other systems are stable or negative.  OBJECTIVE: APPEARANCE:  Elderly White female. Looks well Patient in no acute distress.The patient appeared well nourished and normally developed. Acyanotic. Waist: VITAL SIGNS:BP 130/75  Pulse 63  Temp(Src) 97.4 F (36.3 C) (Oral)  Ht 5\' 4"  (1.626 m)  Wt 134 lb 6.4 oz (60.963 kg)  BMI 23.06 kg/m2   SKIN: warm and  Dry without overt rashes, tattoos and scars  HEAD and Neck: without JVD, Head and scalp: normal Eyes:No scleral icterus. Fundi normal, eye movements normal. Ears: Auricle normal, canal normal, Tympanic membranes normal, insufflation normal. Nose: normal Throat: normal Neck & thyroid: normal  CHEST & LUNGS: Chest wall: normal Lungs: Clear  CVS: Reveals the PMI to be normally located. Regular rhythm, First and Second Heart sounds are normal,  absence of murmurs, rubs or gallops. Peripheral vasculature: Radial pulses: normal Dorsal pedis pulses: normal Posterior pulses: normal  ABDOMEN:  Appearance: normal Benign,, no organomegaly, no masses, no Abdominal Aortic enlargement. No Guarding , no rebound. No Bruits. Bowel sounds: normal  EXTREMETIES:  nonedematous. Both Femoral and Pedal pulses are normal.  MUSCULOSKELETAL:  Spine: normal Joints: intact  NEUROLOGIC: oriented to time,place and person; nonfocal. Strength is normal Sensory is normal Reflexes are normal Cranial Nerves are normal.  ASSESSMENT: HTN (hypertension) - Plan: BASIC METABOLIC PANEL WITH GFR  HLD (hyperlipidemia) - Plan: Hepatic function panel, NMR Lipoprofile with Lipids  GERD (gastroesophageal reflux disease)  Postmenopausal  Multinodular goiter  White coat hypertension  Perimenopausal atrophic vaginitis  Diverticulosis of colon without hemorrhage  Patient stable and  Doing well. Her BP readings at home were excellent in the 110s/50s to 140s/60s Recheck by me was 130/75  PLAN: No orders of the defined types were placed in this encounter.   Orders Placed This Encounter  Procedures  . BASIC METABOLIC PANEL WITH GFR  . Hepatic function panel  . NMR Lipoprofile with Lipids   Continue active lifestyle and healthy diet.  RTC 4 months  Leia Coletti P. [place] Charon, M.D.

## 2012-09-10 ENCOUNTER — Ambulatory Visit (INDEPENDENT_AMBULATORY_CARE_PROVIDER_SITE_OTHER): Payer: Medicare Other

## 2012-09-10 ENCOUNTER — Other Ambulatory Visit: Payer: Self-pay | Admitting: Nurse Practitioner

## 2012-09-10 DIAGNOSIS — Z23 Encounter for immunization: Secondary | ICD-10-CM

## 2012-09-10 LAB — NMR LIPOPROFILE WITH LIPIDS
Cholesterol, Total: 203 mg/dL — ABNORMAL HIGH (ref <200)
HDL Particle Number: 40 umol/L (ref >=30.5)
HDL Size: 9.3 nm (ref >=9.2)
HDL-C: 73 mg/dL (ref >=40)
LDL (calc): 114 mg/dL — ABNORMAL HIGH (ref <100)
LDL Particle Number: 1381 nmol/L — ABNORMAL HIGH (ref <1000)
LDL Size: 20.5 nm — ABNORMAL LOW (ref >20.5)
LP-IR Score: 31 (ref <=45)
Large HDL-P: 12.6 umol/L (ref >=4.8)
Large VLDL-P: 1.6 nmol/L (ref <=2.7)
Small LDL Particle Number: 646 nmol/L — ABNORMAL HIGH (ref <=527)
Triglycerides: 81 mg/dL (ref <150)
VLDL Size: 45.7 nm (ref <=46.6)

## 2012-09-10 MED ORDER — PRAVASTATIN SODIUM 40 MG PO TABS: 40.0000 mg | ORAL_TABLET | Freq: Every day | ORAL | Status: DC

## 2012-10-01 ENCOUNTER — Other Ambulatory Visit: Payer: Self-pay | Admitting: Family Medicine

## 2012-10-21 ENCOUNTER — Encounter: Payer: Self-pay | Admitting: *Deleted

## 2012-10-23 ENCOUNTER — Ambulatory Visit (INDEPENDENT_AMBULATORY_CARE_PROVIDER_SITE_OTHER): Payer: Medicare Other | Admitting: Nurse Practitioner

## 2012-10-23 ENCOUNTER — Encounter: Payer: Self-pay | Admitting: Nurse Practitioner

## 2012-10-23 VITALS — BP 122/60 | HR 74 | Resp 14 | Ht 64.5 in | Wt 133.2 lb

## 2012-10-23 DIAGNOSIS — Z01419 Encounter for gynecological examination (general) (routine) without abnormal findings: Secondary | ICD-10-CM

## 2012-10-23 MED ORDER — ESTROGENS, CONJUGATED 0.625 MG/GM VA CREA: 1.0000 g | TOPICAL_CREAM | VAGINAL | Status: DC

## 2012-10-23 NOTE — Progress Notes (Signed)
79 y.o. G1P0 Married Caucasian Fe here for annual exam.  States some stress incontinence. Has used Premarin vaginal cream at the introitus for years with help. Not sexually active at this time. Wears pads daily and occasional external irritation.  No LMP recorded. Patient is postmenopausal.          Sexually active: no  The current method of family planning is post menopausal status.    Exercising: yes  walking  Smoker:  no  Health Maintenance: Pap:  Years ago MMG:  05/06/2012 normal Colonoscopy:  02/2012 normal, previous polyp history BMD:   Heel screen at [facility] in 2013 normal TDaP:  09/2012 Labs: PCP does lab (blood) work and checks urine.    reports that she has never smoked. She does not have any smokeless tobacco history on file. She reports that she does not drink alcohol or use illicit drugs.  Past Medical History  Diagnosis Date  . Hypertension   . GERD (gastroesophageal reflux disease)   . Hypercholesteremia   . Anxiety   . Arthritis     Past Surgical History  Procedure Laterality Date  . Tonsillectomy      age 34  . Thyroid lobectomy    . Back surgery      lumbar-bulgiing disc  . Tubal ligation    . Cataract extraction      right eye-2011-Dr Hunt  . Cataract extraction w/phaco  06/09/2012    Procedure: CATARACT EXTRACTION PHACO AND INTRAOCULAR LENS PLACEMENT (IOC);  Surgeon: Gemma Payor, MD;  Location: [facility];  Service: Ophthalmology;  Laterality: Left;  CDE=13.91    Current Outpatient Prescriptions  Medication Sig Dispense Refill  . atenolol (TENORMIN) 25 MG tablet TAKE ONE TABLET BY MOUTH ONE TIME DAILY  30 tablet  4  . Calcium Carbonate-Vitamin D (CALCIUM-D) 600-400 MG-UNIT TABS Take 1 tablet by mouth daily.      Marland Kitchen conjugated estrogens (PREMARIN) vaginal cream Place 1 g vaginally 2 (two) times a week.      . folic acid (FOLVITE) 800 MCG tablet Take 800 mcg by mouth daily.      . magnesium oxide (MAG-OX) 400 MG tablet Take 400 mg by mouth 2 (two) times daily.       . Multiple Vitamin (MULTIVITAMIN WITH MINERALS) TABS Take 1 tablet by mouth daily.      . Omega-3 Fatty Acids (FISH OIL) 1200 MG CAPS Take 1,200 mg by mouth daily.      Marland Kitchen omeprazole (PRILOSEC) 40 MG capsule Take 40 mg by mouth daily.      . pravastatin (PRAVACHOL) 40 MG tablet Take 1 tablet (40 mg total) by mouth daily.  90 tablet  1  . psyllium (METAMUCIL) 58.6 % powder Take 1 packet by mouth 2 (two) times daily.       No current facility-administered medications for this visit.    Family History  Problem Relation Age of Onset  . Hypertension Mother   . Anxiety disorder Mother   . Hypertension Father   . Hypertension Maternal Grandmother   . Hypertension Paternal Grandmother     ROS:  Pertinent items are noted in HPI.  Otherwise, a comprehensive ROS was negative.  Exam:   BP 122/60  Pulse 74  Resp 14  Ht 5' 4.5" (1.638 m)  Wt 133 lb 3.2 oz (60.419 kg)  BMI 22.52 kg/m2 Height: 5' 4.5" (163.8 cm)  Ht Readings from Last 3 Encounters:  10/23/12 5' 4.5" (1.638 m)  09/09/12 5\' 4"  (1.626 m)  06/04/12 5\' 5"  (1.651 m)    General appearance: alert, cooperative and appears stated age Head: Normocephalic, without obvious abnormality, atraumatic Neck: no adenopathy, supple, symmetrical, trachea midline and thyroid normal to inspection and palpation Lungs: clear to auscultation bilaterally Breasts: normal appearance, no masses or tenderness Heart: regular rate and rhythm Abdomen: soft, non-tender; no masses,  no organomegaly Extremities: extremities normal, atraumatic, no cyanosis or edema Skin: Skin color, texture, turgor normal. No rashes or lesions Lymph nodes: Cervical, supraclavicular, and axillary nodes normal. No abnormal inguinal nodes palpated Neurologic: Grossly normal   Pelvic: External genitalia: labial lesions with sebaceous cyst expressed X 2              Urethra:  normal appearing urethra with no masses, tenderness or lesions              Bartholin's and  Skene's: normal                 Vagina: atrophic appearing vagina with pale color and no discharge, no lesions              Cervix: anteverted              Pap taken: no Bimanual Exam:  Uterus:  normal size, contour, position, consistency, mobility, non-tender              Adnexa: no mass, fullness, tenderness               Rectovaginal: Confirms               Anus:  normal sphincter tone, no lesions  A:  Well Woman with normal exam  Postmenopausal  Atrophic vaginitis doing better on Estrogen Vaginal cream  Mild stable SUI followed by Dr. Sherron Monday  History of anxiety, GERD, Hypercortisolemia  P:   Pap smear as per guidelines   Mammogram due 12/14  Refill Premarin  vaginal cream  counseled on breast self exam, adequate intake of calcium and vitamin D,  diet and exercise, Kegel's exercises return annually or prn  An After Visit Summary was printed and given to the patient.

## 2012-10-23 NOTE — Patient Instructions (Addendum)

## 2012-10-23 NOTE — Progress Notes (Signed)
Reviewed personally.  M. Suzanne Ryeleigh Santore, MD.  

## 2012-11-01 ENCOUNTER — Other Ambulatory Visit: Payer: Self-pay | Admitting: Family Medicine

## 2012-12-11 ENCOUNTER — Other Ambulatory Visit (INDEPENDENT_AMBULATORY_CARE_PROVIDER_SITE_OTHER): Payer: Medicare Other

## 2012-12-11 DIAGNOSIS — Z79899 Other long term (current) drug therapy: Secondary | ICD-10-CM

## 2012-12-11 DIAGNOSIS — E785 Hyperlipidemia, unspecified: Secondary | ICD-10-CM

## 2012-12-11 DIAGNOSIS — I10 Essential (primary) hypertension: Secondary | ICD-10-CM

## 2012-12-13 LAB — CMP14+EGFR
ALT: 27 IU/L (ref 0–32)
AST: 39 IU/L (ref 0–40)
Albumin/Globulin Ratio: 2.3 (ref 1.1–2.5)
Albumin: 4.4 g/dL (ref 3.5–4.7)
Alkaline Phosphatase: 61 IU/L (ref 39–117)
BUN/Creatinine Ratio: 12 (ref 11–26)
BUN: 12 mg/dL (ref 8–27)
CO2: 29 mmol/L (ref 18–29)
Calcium: 9.7 mg/dL (ref 8.6–10.2)
Chloride: 101 mmol/L (ref 97–108)
Creatinine, Ser: 1.04 mg/dL — ABNORMAL HIGH (ref 0.57–1.00)
GFR calc Af Amer: 59 mL/min/{1.73_m2} — ABNORMAL LOW (ref >59)
GFR calc non Af Amer: 51 mL/min/{1.73_m2} — ABNORMAL LOW (ref >59)
Globulin, Total: 1.9 g/dL (ref 1.5–4.5)
Glucose: 100 mg/dL — ABNORMAL HIGH (ref 65–99)
Potassium: 4.3 mmol/L (ref 3.5–5.2)
Sodium: 140 mmol/L (ref 134–144)
Total Bilirubin: 0.5 mg/dL (ref 0.0–1.2)
Total Protein: 6.3 g/dL (ref 6.0–8.5)

## 2012-12-13 LAB — NMR, LIPOPROFILE
Cholesterol: 183 mg/dL (ref <200)
HDL Cholesterol by NMR: 77 mg/dL (ref >=40)
HDL Particle Number: 39.7 umol/L (ref >=30.5)
LDL Particle Number: 1354 nmol/L — ABNORMAL HIGH (ref <1000)
LDL Size: 21 nm (ref >20.5)
LDLC SERPL CALC-MCNC: 89 mg/dL (ref <100)
LP-IR Score: <25 (ref <=45)
Small LDL Particle Number: 131 nmol/L (ref <=527)
Triglycerides by NMR: 83 mg/dL (ref <150)

## 2012-12-13 NOTE — Progress Notes (Signed)
Quick Note:  Lab result at goal. No change in Medications for now. No Change in plans and follow up. ______ 

## 2012-12-18 ENCOUNTER — Telehealth: Payer: Self-pay | Admitting: Family Medicine

## 2012-12-18 NOTE — Telephone Encounter (Signed)
Pt notified of labs and copy mailed as she requested

## 2012-12-22 ENCOUNTER — Encounter: Payer: Self-pay | Admitting: *Deleted

## 2013-01-07 ENCOUNTER — Ambulatory Visit: Payer: Medicare Other | Admitting: Family Medicine

## 2013-01-09 ENCOUNTER — Ambulatory Visit: Payer: Medicare Other | Admitting: Family Medicine

## 2013-01-15 ENCOUNTER — Other Ambulatory Visit: Payer: Self-pay | Admitting: Internal Medicine

## 2013-01-15 DIAGNOSIS — E049 Nontoxic goiter, unspecified: Secondary | ICD-10-CM

## 2013-01-26 ENCOUNTER — Encounter: Payer: Self-pay | Admitting: *Deleted

## 2013-01-27 ENCOUNTER — Ambulatory Visit
Admission: RE | Admit: 2013-01-27 | Discharge: 2013-01-27 | Disposition: A | Discharge status: Home / Self Care | Payer: Medicare Other | Source: Ambulatory Visit | Attending: Internal Medicine | Admitting: Internal Medicine

## 2013-01-27 DIAGNOSIS — E049 Nontoxic goiter, unspecified: Secondary | ICD-10-CM

## 2013-02-10 ENCOUNTER — Ambulatory Visit (INDEPENDENT_AMBULATORY_CARE_PROVIDER_SITE_OTHER): Payer: Medicare Other

## 2013-02-10 DIAGNOSIS — Z23 Encounter for immunization: Secondary | ICD-10-CM

## 2013-03-02 ENCOUNTER — Other Ambulatory Visit: Payer: Self-pay | Admitting: Family Medicine

## 2013-03-02 ENCOUNTER — Other Ambulatory Visit: Payer: Self-pay | Admitting: Nurse Practitioner

## 2013-03-03 NOTE — Telephone Encounter (Signed)
NTBS for follow up with Dr. Modesto Charon

## 2013-03-03 NOTE — Telephone Encounter (Signed)
Last seen 09/09/12  Modesto Charon

## 2013-04-02 ENCOUNTER — Other Ambulatory Visit: Payer: Self-pay | Admitting: Nurse Practitioner

## 2013-04-08 ENCOUNTER — Ambulatory Visit: Payer: Medicare Other | Admitting: Family Medicine

## 2013-04-10 ENCOUNTER — Ambulatory Visit: Payer: Medicare Other | Admitting: Family Medicine

## 2013-04-13 ENCOUNTER — Ambulatory Visit (INDEPENDENT_AMBULATORY_CARE_PROVIDER_SITE_OTHER): Payer: Medicare Other | Admitting: Family Medicine

## 2013-04-13 ENCOUNTER — Encounter: Payer: Self-pay | Admitting: Family Medicine

## 2013-04-13 ENCOUNTER — Other Ambulatory Visit: Payer: Self-pay

## 2013-04-13 VITALS — BP 178/79 | HR 63 | Temp 97.6°F | Ht 64.0 in | Wt 131.0 lb

## 2013-04-13 DIAGNOSIS — Z780 Asymptomatic menopausal state: Secondary | ICD-10-CM

## 2013-04-13 DIAGNOSIS — Z1231 Encounter for screening mammogram for malignant neoplasm of breast: Secondary | ICD-10-CM

## 2013-04-13 DIAGNOSIS — K5730 Diverticulosis of large intestine without perforation or abscess without bleeding: Secondary | ICD-10-CM

## 2013-04-13 DIAGNOSIS — IMO0001 Reserved for inherently not codable concepts without codable children: Secondary | ICD-10-CM

## 2013-04-13 DIAGNOSIS — K219 Gastro-esophageal reflux disease without esophagitis: Secondary | ICD-10-CM

## 2013-04-13 DIAGNOSIS — E785 Hyperlipidemia, unspecified: Secondary | ICD-10-CM

## 2013-04-13 DIAGNOSIS — N952 Postmenopausal atrophic vaginitis: Secondary | ICD-10-CM

## 2013-04-13 DIAGNOSIS — I10 Essential (primary) hypertension: Secondary | ICD-10-CM

## 2013-04-13 DIAGNOSIS — E042 Nontoxic multinodular goiter: Secondary | ICD-10-CM

## 2013-04-13 MED ORDER — ALPRAZOLAM 0.25 MG PO TABS: 0.2500 mg | ORAL_TABLET | Freq: Every evening | ORAL | Status: DC | PRN

## 2013-04-13 NOTE — Progress Notes (Signed)
Patient ID: Shelly Nelson, female   DOB: [birth year], 79 y.o.   MRN: [id number] SUBJECTIVE: CC: Chief Complaint  Patient presents with  . Follow-up    no complaints     HPI: Patient is here for follow up of hypercholesterolemia/HTN/GERD/Whitecoat syndrome/goiter denies Headache;denies Chest Pain;denies weakness;denies Shortness of Breath and orthopnea;denies Visual changes;denies palpitations;denies cough;denies pedal edema;denies symptoms of TIA or stroke;deniesClaudication symptoms. admits to Compliance with medications; denies Problems with medications. BP was 131/57 at home today. And her series of BPs are in the normal range. Gets very anxious to come here. Has confirmed this with comparison BPs before.   Past Medical History  Diagnosis Date  . GERD (gastroesophageal reflux disease) 2012  . Hypercholesteremia 2000  . Anxiety   . Arthritis   . Hypertension 2000  . Spinal stenosis, lumbar region, without neurogenic claudication 2012  . Thyroid disease 2013    thyroid nodule with needle aspiration   Past Surgical History  Procedure Laterality Date  . Tonsillectomy      age 45  . Thyroid lobectomy  age 66  . Back surgery  11/08/2010    lumbar-bulging disc  . Tubal ligation    . Cataract extraction      left eye-2011-Dr Hunt  . Cataract extraction w/phaco  06/09/2012    Procedure: CATARACT EXTRACTION PHACO AND INTRAOCULAR LENS PLACEMENT (IOC);  Surgeon: Gemma Payor, MD;  Location: [facility];  Service: Ophthalmology;  Laterality: Left;  CDE=13.91   History   Social History  . Marital Status: Married    Spouse Name: N/A    Number of Children: N/A  . Years of Education: N/A   Occupational History  . Not on file.   Social History Main Topics  . Smoking status: Never Smoker   . Smokeless tobacco: Never Used  . Alcohol Use: No  . Drug Use: No  . Sexual Activity: No   Other Topics Concern  . Not on file   Social History Narrative  . No narrative on file   Family  History  Problem Relation Age of Onset  . Hypertension Mother   . Anxiety disorder Mother   . Breast cancer Mother 64    metasis to lung and bones  . Hypertension Father   . COPD Father   . Hypertension Maternal Grandmother   . Hypertension Paternal Grandmother   . Heart disease Paternal Grandmother   . Breast cancer Maternal Aunt   . Cancer Maternal Uncle     X 5 with various cancers  . Stroke Maternal Grandfather   . COPD Paternal Grandfather    Current Outpatient Prescriptions on File Prior to Visit  Medication Sig Dispense Refill  . atenolol (TENORMIN) 25 MG tablet TAKE ONE TABLET BY MOUTH ONE  TIME DAILY  30 tablet  0  . Calcium Carbonate-Vitamin D (CALCIUM-D) 600-400 MG-UNIT TABS Take 1 tablet by mouth daily.      Marland Kitchen conjugated estrogens (PREMARIN) vaginal cream Place 0.5 Applicatorfuls vaginally 2 (two) times a week.  42.5 g  3  . folic acid (FOLVITE) 800 MCG tablet Take 800 mcg by mouth daily.      . magnesium oxide (MAG-OX) 400 MG tablet Take 400 mg by mouth 2 (two) times daily.      . Multiple Vitamin (MULTIVITAMIN WITH MINERALS) TABS Take 1 tablet by mouth daily.      . Omega-3 Fatty Acids (FISH OIL) 1200 MG CAPS Take 1,200 mg by mouth daily.      Marland Kitchen omeprazole (  PRILOSEC) 40 MG capsule TAKE ONE CAPSULE BY MOUTH ONE TIME DAILY  30 capsule  0  . pravastatin (PRAVACHOL) 40 MG tablet TAKE ONE TABLET BY MOUTH ONE TIME DAILY  90 tablet  0  . psyllium (METAMUCIL) 58.6 % powder Take 1 packet by mouth 2 (two) times daily.       No current facility-administered medications on file prior to visit.   No Known Allergies Immunization History  Administered Date(s) Administered  . Influenza,inj,Quad PF,36+ Mos 02/10/2013  . Pneumococcal Polysaccharide-23 05/14/2005  . Tdap 09/10/2012  . Zoster 02/12/2008   Prior to Admission medications   Medication Sig Start Date End Date Taking? Authorizing Provider  atenolol (TENORMIN) 25 MG tablet TAKE ONE TABLET BY MOUTH ONE  TIME DAILY  04/02/13   Mary-Margaret Daphine Deutscher, FNP  Calcium Carbonate-Vitamin D (CALCIUM-D) 600-400 MG-UNIT TABS Take 1 tablet by mouth daily.    Historical Provider, MD  conjugated estrogens (PREMARIN) vaginal cream Place 0.5 Applicatorfuls vaginally 2 (two) times a week. 10/23/12   Lauro Franklin, FNP  folic acid (FOLVITE) 800 MCG tablet Take 800 mcg by mouth daily.    Historical Provider, MD  magnesium oxide (MAG-OX) 400 MG tablet Take 400 mg by mouth 2 (two) times daily.    Historical Provider, MD  Multiple Vitamin (MULTIVITAMIN WITH MINERALS) TABS Take 1 tablet by mouth daily.    Historical Provider, MD  Omega-3 Fatty Acids (FISH OIL) 1200 MG CAPS Take 1,200 mg by mouth daily.    Historical Provider, MD  omeprazole (PRILOSEC) 40 MG capsule TAKE ONE CAPSULE BY MOUTH ONE TIME DAILY 04/02/13   Mary-Margaret Daphine Deutscher, FNP  pravastatin (PRAVACHOL) 40 MG tablet TAKE ONE TABLET BY MOUTH ONE TIME DAILY 03/02/13   Ileana Ladd, MD  psyllium (METAMUCIL) 58.6 % powder Take 1 packet by mouth 2 (two) times daily.    Historical Provider, MD     ROS: As above in the HPI. All other systems are stable or negative.  OBJECTIVE: APPEARANCE:  Patient in no acute distress.The patient appeared well nourished and normally developed. Acyanotic. Waist: VITAL SIGNS:BP 178/79  Pulse 63  Temp(Src) 97.6 F (36.4 C) (Oral)  Ht 5\' 4"  (1.626 m)  Wt 131 lb (59.421 kg)  BMI 22.47 kg/m2 WF 165/80  SKIN: warm and  Dry without overt rashes, tattoos and scars  HEAD and Neck: without JVD, Head and scalp: normal Eyes:No scleral icterus. Fundi normal, eye movements normal. Ears: Auricle normal, canal normal, Tympanic membranes normal, insufflation normal. Nose: normal Throat: normal Neck & thyroid: normal  CHEST & LUNGS: Chest wall: normal Lungs: Clear  CVS: Reveals the PMI to be normally located. Regular rhythm, First and Second Heart sounds are normal,  absence of murmurs, rubs or gallops. Peripheral  vasculature: Radial pulses: normal Dorsal pedis pulses: normal Posterior pulses: normal  ABDOMEN:  Appearance: normal Benign, no organomegaly, no masses, no Abdominal Aortic enlargement. No Guarding , no rebound. No Bruits. Bowel sounds: normal  RECTAL: N/A GU: N/A  EXTREMETIES: nonedematous.  MUSCULOSKELETAL:  Spine: normal Joints: intact  NEUROLOGIC: oriented to time,place and person; nonfocal. Strength is normal Sensory is normal Reflexes are normal Cranial Nerves are normal.   Results for orders placed in visit on 12/11/12  CMP14+EGFR      Result Value Range   Glucose 100 (*) 65 - 99 mg/dL   BUN 12  8 - 27 mg/dL   Creatinine, Ser 1.19 (*) 0.57 - 1.00 mg/dL   GFR calc non Af Amer 51 (*) >59  mL/min/1.73   GFR calc Af Amer 59 (*) >59 mL/min/1.73   BUN/Creatinine Ratio 12  11 - 26   Sodium 140  134 - 144 mmol/L   Potassium 4.3  3.5 - 5.2 mmol/L   Chloride 101  97 - 108 mmol/L   CO2 29  18 - 29 mmol/L   Calcium 9.7  8.6 - 10.2 mg/dL   Total Protein 6.3  6.0 - 8.5 g/dL   Albumin 4.4  3.5 - 4.7 g/dL   Globulin, Total 1.9  1.5 - 4.5 g/dL   Albumin/Globulin Ratio 2.3  1.1 - 2.5   Total Bilirubin 0.5  0.0 - 1.2 mg/dL   Alkaline Phosphatase 61  39 - 117 IU/L   AST 39  0 - 40 IU/L   ALT 27  0 - 32 IU/L  NMR, LIPOPROFILE      Result Value Range   LDL Particle Number [id number] (*) <1000 nmol/L   LDLC SERPL CALC-MCNC 89  <100 mg/dL   HDL Cholesterol by NMR 77  >=40 mg/dL   Triglycerides by NMR 83  <150 mg/dL   Cholesterol 161  <096 mg/dL   HDL Particle Number 04.5  >=40.9 umol/L   Small LDL Particle Number 131  <=527 nmol/L   LDL Size 21.0  >20.5 nm   LP-IR Score < 25  <= 45    ASSESSMENT:  HLD (hyperlipidemia) - Plan: CMP14+EGFR, Lipid panel  HTN (hypertension) - Plan: CMP14+EGFR  GERD (gastroesophageal reflux disease)  Multinodular goiter  Postmenopausal  White coat hypertension - Plan: ALPRAZolam (XANAX) 0.25 MG tablet  Diverticulosis of colon without  hemorrhage  Perimenopausal atrophic vaginitis  Medical problems stable  PLAN:  Orders Placed This Encounter  Procedures  . CMP14+EGFR  . Lipid panel   Meds ordered this encounter  Medications  . DISCONTD: ALPRAZolam (XANAX) 0.25 MG tablet    Sig: Take 0.25 mg by mouth at bedtime as needed for anxiety.  . ALPRAZolam (XANAX) 0.25 MG tablet    Sig: Take 1 tablet (0.25 mg total) by mouth at bedtime as needed for anxiety.    Dispense:  30 tablet    Refill:  0  handouts in the [facility] on DASH diet and Hypercholesterolemia. Await labs. Reviewed previous labs.  Medications Discontinued During This Encounter  Medication Reason  . ALPRAZolam (XANAX) 0.25 MG tablet Reorder   Return in about 4 months (around 08/12/2013) for Recheck medical problems.  Khadijatou Borak P. [place] Charon, M.D.

## 2013-04-13 NOTE — Patient Instructions (Signed)
DASH Diet The DASH diet stands for "Dietary Approaches to Stop Hypertension." It is a healthy eating plan that has been shown to reduce high blood pressure (hypertension) in as little as 14 days, while also possibly providing other significant health benefits. These other health benefits include reducing the risk of breast cancer after menopause and reducing the risk of type 2 diabetes, heart disease, colon cancer, and stroke. Health benefits also include weight loss and slowing kidney failure in patients with chronic kidney disease.  DIET GUIDELINES  Limit salt (sodium). Your diet should contain less than 1500 mg of sodium daily.  Limit refined or processed carbohydrates. Your diet should include mostly whole grains. Desserts and added sugars should be used sparingly.  Include small amounts of heart-healthy fats. These types of fats include nuts, oils, and tub margarine. Limit saturated and trans fats. These fats have been shown to be harmful in the body. CHOOSING FOODS  The following food groups are based on a 2000 calorie diet. See your Registered Dietitian for individual calorie needs. Grains and Grain Products (6 to 8 servings daily)  Eat More Often: Whole-wheat bread, brown rice, whole-grain or wheat pasta, quinoa, popcorn without added fat or salt (air popped).  Eat Less Often: White bread, white pasta, white rice, cornbread. Vegetables (4 to 5 servings daily)  Eat More Often: Fresh, frozen, and canned vegetables. Vegetables may be raw, steamed, roasted, or grilled with a minimal amount of fat.  Eat Less Often/Avoid: Creamed or fried vegetables. Vegetables in a cheese sauce. Fruit (4 to 5 servings daily)  Eat More Often: All fresh, canned (in natural juice), or frozen fruits. Dried fruits without added sugar. One hundred percent fruit juice ( cup [237 mL] daily).  Eat Less Often: Dried fruits with added sugar. Canned fruit in light or heavy syrup. Lean Meats, Fish, and Poultry (2  servings or less daily. One serving is 3 to 4 oz [85-114 g]).  Eat More Often: Ninety percent or leaner ground beef, tenderloin, sirloin. Round cuts of beef, chicken breast, turkey breast. All fish. Grill, bake, or broil your meat. Nothing should be fried.  Eat Less Often/Avoid: Fatty cuts of meat, turkey, or chicken leg, thigh, or wing. Fried cuts of meat or fish. Dairy (2 to 3 servings)  Eat More Often: Low-fat or fat-free milk, low-fat plain or light yogurt, reduced-fat or part-skim cheese.  Eat Less Often/Avoid: Milk (whole, 2%).Whole milk yogurt. Full-fat cheeses. Nuts, Seeds, and Legumes (4 to 5 servings per week)  Eat More Often: All without added salt.  Eat Less Often/Avoid: Salted nuts and seeds, canned beans with added salt. Fats and Sweets (limited)  Eat More Often: Vegetable oils, tub margarines without trans fats, sugar-free gelatin. Mayonnaise and salad dressings.  Eat Less Often/Avoid: Coconut oils, palm oils, butter, stick margarine, cream, half and half, cookies, candy, pie. FOR MORE INFORMATION The Dash Diet Eating Plan: www.dashdiet.org Document Released: 04/19/2011 Document Revised: 07/23/2011 Document Reviewed: 04/19/2011 [employer] Patient Information 2014 [employer].    Hypercholesterolemia High Blood Cholesterol Cholesterol is a white, waxy, fat-like protein needed by your body in small amounts. The liver makes all the cholesterol you need. It is carried from the liver by the blood through the blood vessels. Deposits (plaque) may build up on blood vessel walls. This makes the arteries narrower and stiffer. Plaque increases the risk for heart attack and stroke. You cannot feel your cholesterol level even if it is very high. The only way to know is by a blood   test to check your lipid (fats) levels. Once you know your cholesterol levels, you should keep a record of the test results. Work with your caregiver to to keep your levels in the desired range. WHAT THE  RESULTS MEAN:  Total cholesterol is a rough measure of all the cholesterol in your blood.  LDL is the so-called bad cholesterol. This is the type that deposits cholesterol in the walls of the arteries. You want this level to be low.  HDL is the good cholesterol because it cleans the arteries and carries the LDL away. You want this level to be high.  Triglycerides are fat that the body can either burn for energy or store. High levels are closely linked to heart disease. DESIRED LEVELS:  Total cholesterol below 200.  LDL below 100 for people at risk, below 70 for very high risk.  HDL above 50 is good, above 60 is best.  Triglycerides below 150. HOW TO LOWER YOUR CHOLESTEROL:  Diet.  Choose fish or white meat chicken and turkey, roasted or baked. Limit fatty cuts of red meat, fried foods, and processed meats, such as sausage and lunch meat.  Eat lots of fresh fruits and vegetables. Choose whole grains, beans, pasta, potatoes and cereals.  Use only small amounts of olive, corn or canola oils. Avoid butter, mayonnaise, shortening or palm kernel oils. Avoid foods with trans-fats.  Use skim/nonfat milk and low-fat/nonfat yogurt and cheeses. Avoid whole milk, cream, ice cream, egg yolks and cheeses. Healthy desserts include angel food cake, gingersnaps, animal crackers, hard candy, popsicles, and low-fat/nonfat frozen yogurt. Avoid pastries, cakes, pies and cookies.  Exercise.  A regular program helps decrease LDL and raises HDL.  Helps with weight control.  Do things that increase your activity level like gardening, walking, or taking the stairs.  Medication.  May be prescribed by your caregiver to help lowering cholesterol and the risk for heart disease.  You may need medicine even if your levels are normal if you have several risk factors. HOME CARE INSTRUCTIONS   Follow your diet and exercise programs as suggested by your caregiver.  Take medications as directed.  Have  blood work done when your caregiver feels it is necessary. MAKE SURE YOU:   Understand these instructions.  Will watch your condition.  Will get help right away if you are not doing well or get worse. Document Released: 04/30/2005 Document Revised: 07/23/2011 Document Reviewed: 10/16/2006 Exit[employer] Patient Information 2014 [employer].  

## 2013-04-14 LAB — LIPID PANEL
Chol/HDL Ratio: 2.3 ratio units (ref 0.0–4.4)
Cholesterol, Total: 199 mg/dL (ref 100–199)
HDL: 87 mg/dL (ref >39)
LDL Calculated: 100 mg/dL — ABNORMAL HIGH (ref 0–99)
Triglycerides: 61 mg/dL (ref 0–149)
VLDL Cholesterol Cal: 12 mg/dL (ref 5–40)

## 2013-04-14 LAB — CMP14+EGFR
ALT: 22 IU/L (ref 0–32)
AST: 36 IU/L (ref 0–40)
Albumin/Globulin Ratio: 2.4 (ref 1.1–2.5)
Albumin: 4.6 g/dL (ref 3.5–4.7)
Alkaline Phosphatase: 62 IU/L (ref 39–117)
BUN/Creatinine Ratio: 9 — ABNORMAL LOW (ref 11–26)
BUN: 9 mg/dL (ref 8–27)
CO2: 30 mmol/L — ABNORMAL HIGH (ref 18–29)
Calcium: 9.5 mg/dL (ref 8.6–10.2)
Chloride: 101 mmol/L (ref 97–108)
Creatinine, Ser: 0.95 mg/dL (ref 0.57–1.00)
GFR calc Af Amer: 65 mL/min/{1.73_m2} (ref >59)
GFR calc non Af Amer: 56 mL/min/{1.73_m2} — ABNORMAL LOW (ref >59)
Globulin, Total: 1.9 g/dL (ref 1.5–4.5)
Glucose: 98 mg/dL (ref 65–99)
Potassium: 4.6 mmol/L (ref 3.5–5.2)
Sodium: 143 mmol/L (ref 134–144)
Total Bilirubin: 0.5 mg/dL (ref 0.0–1.2)
Total Protein: 6.5 g/dL (ref 6.0–8.5)

## 2013-04-17 ENCOUNTER — Telehealth: Payer: Self-pay | Admitting: Family Medicine

## 2013-04-17 NOTE — Telephone Encounter (Signed)
Pt notified of labs and copy mailed to pt. 

## 2013-04-30 ENCOUNTER — Other Ambulatory Visit: Payer: Self-pay | Admitting: Nurse Practitioner

## 2013-05-19 ENCOUNTER — Encounter: Payer: Self-pay | Admitting: *Deleted

## 2013-05-19 ENCOUNTER — Ambulatory Visit
Admission: RE | Admit: 2013-05-19 | Discharge: 2013-05-19 | Disposition: A | Discharge status: Home / Self Care | Payer: Medicare Other | Source: Ambulatory Visit

## 2013-05-19 DIAGNOSIS — Z1231 Encounter for screening mammogram for malignant neoplasm of breast: Secondary | ICD-10-CM

## 2013-06-02 ENCOUNTER — Other Ambulatory Visit: Payer: Self-pay | Admitting: Family Medicine

## 2013-07-01 ENCOUNTER — Other Ambulatory Visit: Payer: Self-pay | Admitting: Nurse Practitioner

## 2013-07-03 ENCOUNTER — Other Ambulatory Visit: Payer: Self-pay | Admitting: *Deleted

## 2013-07-03 NOTE — Telephone Encounter (Signed)
Last AEX and refill 10/23/12 42.5g/3 refill Next appt 10/27/13

## 2013-07-06 MED ORDER — ESTROGENS, CONJUGATED 0.625 MG/GM VA CREA
1.0000 g | TOPICAL_CREAM | VAGINAL | Status: DC
Start: ? — End: 2013-11-17

## 2013-08-14 ENCOUNTER — Ambulatory Visit: Payer: Medicare Other | Admitting: Family Medicine

## 2013-08-17 ENCOUNTER — Ambulatory Visit (INDEPENDENT_AMBULATORY_CARE_PROVIDER_SITE_OTHER): Payer: Medicare Other | Admitting: Family Medicine

## 2013-08-17 ENCOUNTER — Encounter: Payer: Self-pay | Admitting: Family Medicine

## 2013-08-17 VITALS — BP 140/84 | HR 61 | Temp 97.7°F | Ht 64.0 in | Wt 132.2 lb

## 2013-08-17 DIAGNOSIS — E785 Hyperlipidemia, unspecified: Secondary | ICD-10-CM

## 2013-08-17 DIAGNOSIS — K219 Gastro-esophageal reflux disease without esophagitis: Secondary | ICD-10-CM

## 2013-08-17 DIAGNOSIS — E042 Nontoxic multinodular goiter: Secondary | ICD-10-CM

## 2013-08-17 DIAGNOSIS — Z780 Asymptomatic menopausal state: Secondary | ICD-10-CM

## 2013-08-17 DIAGNOSIS — I10 Essential (primary) hypertension: Secondary | ICD-10-CM

## 2013-08-17 DIAGNOSIS — IMO0001 Reserved for inherently not codable concepts without codable children: Secondary | ICD-10-CM

## 2013-08-17 DIAGNOSIS — K5730 Diverticulosis of large intestine without perforation or abscess without bleeding: Secondary | ICD-10-CM

## 2013-08-17 DIAGNOSIS — N952 Postmenopausal atrophic vaginitis: Secondary | ICD-10-CM

## 2013-08-17 NOTE — Patient Instructions (Signed)
DASH Diet The DASH diet stands for "Dietary Approaches to Stop Hypertension." It is a healthy eating plan that has been shown to reduce high blood pressure (hypertension) in as little as 14 days, while also possibly providing other significant health benefits. These other health benefits include reducing the risk of breast cancer after menopause and reducing the risk of type 2 diabetes, heart disease, colon cancer, and stroke. Health benefits also include weight loss and slowing kidney failure in patients with chronic kidney disease.  DIET GUIDELINES  Limit salt (sodium). Your diet should contain less than 1500 mg of sodium daily.  Limit refined or processed carbohydrates. Your diet should include mostly whole grains. Desserts and added sugars should be used sparingly.  Include small amounts of heart-healthy fats. These types of fats include nuts, oils, and tub margarine. Limit saturated and trans fats. These fats have been shown to be harmful in the body. CHOOSING FOODS  The following food groups are based on a 2000 calorie diet. See your Registered Dietitian for individual calorie needs. Grains and Grain Products (6 to 8 servings daily)  Eat More Often: Whole-wheat bread, brown rice, whole-grain or wheat pasta, quinoa, popcorn without added fat or salt (air popped).  Eat Less Often: White bread, white pasta, white rice, cornbread. Vegetables (4 to 5 servings daily)  Eat More Often: Fresh, frozen, and canned vegetables. Vegetables may be raw, steamed, roasted, or grilled with a minimal amount of fat.  Eat Less Often/Avoid: Creamed or fried vegetables. Vegetables in a cheese sauce. Fruit (4 to 5 servings daily)  Eat More Often: All fresh, canned (in natural juice), or frozen fruits. Dried fruits without added sugar. One hundred percent fruit juice ( cup [237 mL] daily).  Eat Less Often: Dried fruits with added sugar. Canned fruit in light or heavy syrup. [employer], Fish, and Poultry (2  servings or less daily. One serving is 3 to 4 oz [85-114 g]).  Eat More Often: Ninety percent or leaner ground beef, tenderloin, sirloin. Round cuts of beef, chicken breast, [place] breast. All fish. Grill, bake, or broil your meat. Nothing should be fried.  Eat Less Often/Avoid: Fatty cuts of meat, [place], or chicken leg, thigh, or wing. Fried cuts of meat or fish. Dairy (2 to 3 servings)  Eat More Often: Low-fat or fat-free milk, low-fat plain or light yogurt, reduced-fat or part-skim cheese.  Eat Less Often/Avoid: Milk (whole, 2%).Whole milk yogurt. Full-fat cheeses. Nuts, Seeds, and Legumes (4 to 5 servings per week)  Eat More Often: All without added salt.  Eat Less Often/Avoid: Salted nuts and seeds, canned beans with added salt. Fats and Sweets (limited)  Eat More Often: Vegetable oils, tub margarines without trans fats, sugar-free gelatin. Mayonnaise and salad dressings.  Eat Less Often/Avoid: Coconut oils, palm oils, butter, stick margarine, cream, half and half, cookies, candy, pie. FOR MORE INFORMATION The Dash Diet Eating Plan: www.dashdiet.org Document Released: 04/19/2011 Document Revised: 07/23/2011 Document Reviewed: 04/19/2011 [facility] Patient Information 2014 [place], [place].    Hypercholesterolemia High Blood Cholesterol Cholesterol is a white, waxy, fat-like protein needed by your body in small amounts. The liver makes all the cholesterol you need. It is carried from the liver by the blood through the blood vessels. Deposits (plaque) may build up on blood vessel walls. This makes the arteries narrower and stiffer. Plaque increases the risk for heart attack and stroke. You cannot feel your cholesterol level even if it is very high. The only way to know is by a blood  test to check your lipid (fats) levels. Once you know your cholesterol levels, you should keep a record of the test results. Work with your caregiver to to keep your levels in the desired range. WHAT THE  RESULTS MEAN:  Total cholesterol is a rough measure of all the cholesterol in your blood.  LDL is the so-called bad cholesterol. This is the type that deposits cholesterol in the walls of the arteries. You want this level to be low.  HDL is the good cholesterol because it cleans the arteries and carries the LDL away. You want this level to be high.  Triglycerides are fat that the body can either burn for energy or store. High levels are closely linked to heart disease. DESIRED LEVELS:  Total cholesterol below 200.  LDL below 100 for people at risk, below 70 for very high risk.  HDL above 50 is good, above 60 is best.  Triglycerides below 150. HOW TO LOWER YOUR CHOLESTEROL:  Diet.  Choose fish or white meat chicken and [place], roasted or baked. Limit fatty cuts of red meat, fried foods, and processed meats, such as sausage and lunch meat.  Eat lots of fresh fruits and vegetables. Choose whole grains, beans, pasta, potatoes and cereals.  Use only small amounts of olive, corn or canola oils. Avoid butter, mayonnaise, shortening or palm kernel oils. Avoid foods with trans-fats.  Use skim/nonfat milk and low-fat/nonfat yogurt and cheeses. Avoid whole milk, cream, ice cream, egg yolks and cheeses. Healthy desserts include angel food cake, gingersnaps, animal crackers, hard candy, popsicles, and low-fat/nonfat frozen yogurt. Avoid pastries, cakes, pies and cookies.  Exercise.  A regular program helps decrease LDL and raises HDL.  Helps with weight control.  Do things that increase your activity level like gardening, walking, or taking the stairs.  Medication.  May be prescribed by your caregiver to help lowering cholesterol and the risk for heart disease.  You may need medicine even if your levels are normal if you have several risk factors. HOME CARE INSTRUCTIONS   Follow your diet and exercise programs as suggested by your caregiver.  Take medications as directed.  Have  blood work done when your caregiver feels it is necessary. MAKE SURE YOU:   Understand these instructions.  Will watch your condition.  Will get help right away if you are not doing well or get worse. Document Released: 04/30/2005 Document Revised: 07/23/2011 Document Reviewed: 10/16/2006 [facility] Patient Information 2014 [place].

## 2013-08-17 NOTE — Progress Notes (Signed)
Patient ID: Shelly Nelson, female   DOB: [birth year], 80 y.o.   MRN: [id number] SUBJECTIVE: CC: Chief Complaint  Patient presents with  . Follow-up    4 months    HPI:  4 months follow up .complaints.  Patient is here for follow up of hyperlipidemia/HTN/GERD/Anxiety: denies Headache;denies Chest Pain;denies weakness;denies Shortness of Breath and orthopnea;denies Visual changes;denies palpitations;denies cough;denies pedal edema;denies symptoms of TIA or stroke;deniesClaudication symptoms. admits to Compliance with medications; denies Problems with medications.    Past Medical History  Diagnosis Date  . GERD (gastroesophageal reflux disease) 2012  . Hypercholesteremia 2000  . Anxiety   . Arthritis   . Hypertension 2000  . Spinal stenosis, lumbar region, without neurogenic claudication 2012  . Thyroid disease 2013    thyroid nodule with needle aspiration   Past Surgical History  Procedure Laterality Date  . Tonsillectomy      age 77  . Thyroid lobectomy  age 73  . Back surgery  11/08/2010    lumbar-bulging disc  . Tubal ligation    . Cataract extraction      left eye-2011-Dr Hunt  . Cataract extraction w/phaco  06/09/2012    Procedure: CATARACT EXTRACTION PHACO AND INTRAOCULAR LENS PLACEMENT (IOC);  Surgeon: Tonny Branch, MD;  Location: [facility];  Service: Ophthalmology;  Laterality: Left;  CDE=13.91   History   Social History  . Marital Status: Married    Spouse Name: N/A    Number of Children: N/A  . Years of Education: N/A   Occupational History  . Not on file.   Social History Main Topics  . Smoking status: Never Smoker   . Smokeless tobacco: Never Used  . Alcohol Use: No  . Drug Use: No  . Sexual Activity: No   Other Topics Concern  . Not on file   Social History Narrative  . No narrative on file   Family History  Problem Relation Age of Onset  . Hypertension Mother   . Anxiety disorder Mother   . Breast cancer Mother 22    metasis to lung and  bones  . Hypertension Father   . COPD Father   . Hypertension Maternal Grandmother   . Hypertension Paternal Grandmother   . Heart disease Paternal Grandmother   . Breast cancer Maternal Aunt   . Cancer Maternal Uncle     X 5 with various cancers  . Stroke Maternal Grandfather   . COPD Paternal Grandfather    Current Outpatient Prescriptions on File Prior to Visit  Medication Sig Dispense Refill  . ALPRAZolam (XANAX) 0.25 MG tablet Take 1 tablet (0.25 mg total) by mouth at bedtime as needed for anxiety.  30 tablet  0  . atenolol (TENORMIN) 25 MG tablet TAKE ONE TABLET BY MOUTH ONE TIME DAILY  30 tablet  5  . Calcium Carbonate-Vitamin D (CALCIUM-D) 600-400 MG-UNIT TABS Take 1 tablet by mouth daily.      Marland Kitchen conjugated estrogens (PREMARIN) vaginal cream Place 0.5 Applicatorfuls vaginally 2 (two) times a week.  79.4 g  1  . folic acid (FOLVITE) 801 MCG tablet Take 800 mcg by mouth daily.      . magnesium oxide (MAG-OX) 400 MG tablet Take 400 mg by mouth 2 (two) times daily.      . Multiple Vitamin (MULTIVITAMIN WITH MINERALS) TABS Take 1 tablet by mouth daily.      . Omega-3 Fatty Acids (FISH OIL) 1200 MG CAPS Take 1,200 mg by mouth daily.      Marland Kitchen  omeprazole (PRILOSEC) 40 MG capsule TAKE ONE CAPSULE BY MOUTH ONE TIME DAILY  30 capsule  5  . pravastatin (PRAVACHOL) 40 MG tablet TAKE ONE TABLET BY MOUTH ONE  TIME DAILY  90 tablet  0  . psyllium (METAMUCIL) 58.6 % powder Take 1 packet by mouth 2 (two) times daily.       No current facility-administered medications on file prior to visit.   No Known Allergies Immunization History  Administered Date(s) Administered  . Influenza,inj,Quad PF,36+ Mos 02/10/2013  . Pneumococcal Polysaccharide-23 05/14/2005  . Tdap 09/10/2012  . Zoster 02/12/2008   Prior to Admission medications   Medication Sig Start Date End Date Taking? Authorizing Provider  ALPRAZolam (XANAX) 0.25 MG tablet Take 1 tablet (0.25 mg total) by mouth at bedtime as needed for  anxiety. 04/13/13  Yes Vernie Shanks, MD  atenolol (TENORMIN) 25 MG tablet TAKE ONE TABLET BY MOUTH ONE TIME DAILY 04/30/13  Yes Chipper Herb, MD  Calcium Carbonate-Vitamin D (CALCIUM-D) 600-400 MG-UNIT TABS Take 1 tablet by mouth daily.   Yes Historical Provider, MD  conjugated estrogens (PREMARIN) vaginal cream Place 0.5 Applicatorfuls vaginally 2 (two) times a week.   Yes Milford Cage, FNP  folic acid (FOLVITE) 220 MCG tablet Take 800 mcg by mouth daily.   Yes Historical Provider, MD  magnesium oxide (MAG-OX) 400 MG tablet Take 400 mg by mouth 2 (two) times daily.   Yes Historical Provider, MD  Multiple Vitamin (MULTIVITAMIN WITH MINERALS) TABS Take 1 tablet by mouth daily.   Yes Historical Provider, MD  Omega-3 Fatty Acids (FISH OIL) 1200 MG CAPS Take 1,200 mg by mouth daily.   Yes Historical Provider, MD  omeprazole (PRILOSEC) 40 MG capsule TAKE ONE CAPSULE BY MOUTH ONE TIME DAILY 04/30/13  Yes Chipper Herb, MD  pravastatin (PRAVACHOL) 40 MG tablet TAKE ONE TABLET BY MOUTH ONE  TIME DAILY 06/02/13  Yes Vernie Shanks, MD  psyllium (METAMUCIL) 58.6 % powder Take 1 packet by mouth 2 (two) times daily.   Yes Historical Provider, MD     ROS: As above in the HPI. All other systems are stable or negative.  OBJECTIVE: APPEARANCE:  Patient in no acute distress.The patient appeared well nourished and normally developed. Acyanotic. Waist: VITAL SIGNS:BP 140/84  Pulse 61  Temp(Src) 97.7 F (36.5 C) (Oral)  Ht $R'5\' 4"'cr$  (1.626 m)  Wt 132 lb 3.2 oz (59.966 kg)  BMI 22.68 kg/m2 Recheck BP 140/84 WF Looks well for her age  SKIN: warm and  Dry without overt rashes, tattoos and scars  HEAD and Neck: without JVD, Head and scalp: normal Eyes:No scleral icterus. Fundi normal, eye movements normal. Ears: Auricle normal, canal normal, Tympanic membranes normal, insufflation normal. Nose: normal Throat: normal Neck & thyroid: normal  CHEST & LUNGS: Chest wall: normal Lungs:  Clear  CVS: Reveals the PMI to be normally located. Regular rhythm, First and Second Heart sounds are normal,  absence of murmurs, rubs or gallops. Peripheral vasculature: Radial pulses: normal Dorsal pedis pulses: normal Posterior pulses: normal  ABDOMEN:  Appearance: normal Benign, no organomegaly, no masses, no Abdominal Aortic enlargement. No Guarding , no rebound. No Bruits. Bowel sounds: normal  RECTAL: N/A GU: N/A  EXTREMETIES: nonedematous.  MUSCULOSKELETAL:  Spine: normal Joints: intact  NEUROLOGIC: oriented to time,place and person; nonfocal. Strength is normal Sensory is normal Reflexes are normal Cranial Nerves are normal.  Results for orders placed in visit on 04/13/13  CMP14+EGFR      Result Value  Ref Range   Glucose 98  65 - 99 mg/dL   BUN 9  8 - 27 mg/dL   Creatinine, Ser 0.95  0.57 - 1.00 mg/dL   GFR calc non Af Amer 56 (*) >59 mL/min/1.73   GFR calc Af Amer 65  >59 mL/min/1.73   BUN/Creatinine Ratio 9 (*) 11 - 26   Sodium 143  134 - 144 mmol/L   Potassium 4.6  3.5 - 5.2 mmol/L   Chloride 101  97 - 108 mmol/L   CO2 30 (*) 18 - 29 mmol/L   Calcium 9.5  8.6 - 10.2 mg/dL   Total Protein 6.5  6.0 - 8.5 g/dL   Albumin 4.6  3.5 - 4.7 g/dL   Globulin, Total 1.9  1.5 - 4.5 g/dL   Albumin/Globulin Ratio 2.4  1.1 - 2.5   Total Bilirubin 0.5  0.0 - 1.2 mg/dL   Alkaline Phosphatase 62  39 - 117 IU/L   AST 36  0 - 40 IU/L   ALT 22  0 - 32 IU/L  LIPID PANEL      Result Value Ref Range   Cholesterol, Total 199  100 - 199 mg/dL   Triglycerides 61  0 - 149 mg/dL   HDL 87  >39 mg/dL   VLDL Cholesterol Cal 12  5 - 40 mg/dL   LDL Calculated 100 (*) 0 - 99 mg/dL   Chol/HDL Ratio 2.3  0.0 - 4.4 ratio units    ASSESSMENT:  HTN (hypertension) - Plan: CMP14+EGFR  HLD (hyperlipidemia) - Plan: Lipid panel  White coat hypertension  Postmenopausal  Multinodular goiter  GERD (gastroesophageal reflux disease)  Perimenopausal atrophic  vaginitis  Diverticulosis of colon without hemorrhage BPs at home are at goal. See results in the media section.  PLAN:  Handout on DASH diet; hypercholesterolemia in the AVS.  Keep well and active. Healthy diet  No change in medications.  Reviewed wellness.Upto Date   Orders Placed This Encounter  Procedures  . CMP14+EGFR  . Lipid panel   No orders of the defined types were placed in this encounter.   There are no discontinued medications. Return in about 4 months (around 12/17/2013) for Recheck medical problems.  Hosteen Kienast P. Jacelyn Grip, M.D.

## 2013-08-18 LAB — CMP14+EGFR
ALT: 21 IU/L (ref 0–32)
AST: 34 IU/L (ref 0–40)
Albumin/Globulin Ratio: 1.9 (ref 1.1–2.5)
Albumin: 4.4 g/dL (ref 3.5–4.7)
Alkaline Phosphatase: 61 IU/L (ref 39–117)
BUN/Creatinine Ratio: 12 (ref 11–26)
BUN: 12 mg/dL (ref 8–27)
CO2: 26 mmol/L (ref 18–29)
Calcium: 9.7 mg/dL (ref 8.7–10.3)
Chloride: 102 mmol/L (ref 97–108)
Creatinine, Ser: 1.02 mg/dL — ABNORMAL HIGH (ref 0.57–1.00)
GFR calc Af Amer: 60 mL/min/{1.73_m2} (ref >59)
GFR calc non Af Amer: 52 mL/min/{1.73_m2} — ABNORMAL LOW (ref >59)
Globulin, Total: 2.3 g/dL (ref 1.5–4.5)
Glucose: 100 mg/dL — ABNORMAL HIGH (ref 65–99)
Potassium: 4.8 mmol/L (ref 3.5–5.2)
Sodium: 144 mmol/L (ref 134–144)
Total Bilirubin: 0.4 mg/dL (ref 0.0–1.2)
Total Protein: 6.7 g/dL (ref 6.0–8.5)

## 2013-08-18 LAB — LIPID PANEL
Chol/HDL Ratio: 2.3 ratio units (ref 0.0–4.4)
Cholesterol, Total: 189 mg/dL (ref 100–199)
HDL: 83 mg/dL (ref >39)
LDL Calculated: 92 mg/dL (ref 0–99)
Triglycerides: 70 mg/dL (ref 0–149)
VLDL Cholesterol Cal: 14 mg/dL (ref 5–40)

## 2013-08-26 ENCOUNTER — Other Ambulatory Visit: Payer: Self-pay | Admitting: Family Medicine

## 2013-09-10 HISTORY — PX: OTHER SURGICAL HISTORY: SHX169

## 2013-09-21 ENCOUNTER — Encounter: Payer: Self-pay | Admitting: Nurse Practitioner

## 2013-10-26 ENCOUNTER — Other Ambulatory Visit: Payer: Self-pay | Admitting: Family Medicine

## 2013-10-27 ENCOUNTER — Ambulatory Visit: Payer: Medicare Other | Admitting: Nurse Practitioner

## 2013-11-17 ENCOUNTER — Encounter: Payer: Self-pay | Admitting: Nurse Practitioner

## 2013-11-17 ENCOUNTER — Ambulatory Visit (INDEPENDENT_AMBULATORY_CARE_PROVIDER_SITE_OTHER): Payer: Medicare Other | Admitting: Nurse Practitioner

## 2013-11-17 VITALS — BP 146/80 | HR 68 | Ht 64.0 in | Wt 134.0 lb

## 2013-11-17 DIAGNOSIS — N952 Postmenopausal atrophic vaginitis: Secondary | ICD-10-CM

## 2013-11-17 DIAGNOSIS — Z01419 Encounter for gynecological examination (general) (routine) without abnormal findings: Secondary | ICD-10-CM

## 2013-11-17 DIAGNOSIS — N393 Stress incontinence (female) (male): Secondary | ICD-10-CM

## 2013-11-17 MED ORDER — ESTROGENS, CONJUGATED 0.625 MG/GM VA CREA: 1.0000 g | TOPICAL_CREAM | VAGINAL | Status: DC

## 2013-11-17 NOTE — Patient Instructions (Signed)

## 2013-11-17 NOTE — Progress Notes (Signed)
Patient ID: Shelly Nelson, female   DOB: [birth year], 80 y.o.   MRN: [id number] 80 y.o. G1P0 Married Caucasian Fe here for annual exam.  States she feels well and walks 2 1/2 miles a day.  Continues to use the Premarin vaginal cream most times at 0.5 gm once a week.  Really helps her with stress incontinence.  No LMP recorded. Patient is postmenopausal.          Sexually active: no  The current method of family planning is post menopausal status.  Exercising: yes walking  Smoker: no   Health Maintenance:  Pap: Years ago  MMG: 05/19/13, Bi-Rads 1: negative Colonoscopy: 02/2012 normal, previous polyp history  BMD: Heel screen at [facility] in 2013 normal  TDaP: 09/2012  Labs: PCP does lab (blood) work and checks urine.    reports that she has never smoked. She has never used smokeless tobacco. She reports that she does not drink alcohol or use illicit drugs.  Past Medical History  Diagnosis Date  . GERD (gastroesophageal reflux disease) 2012  . Hypercholesteremia 2000  . Anxiety   . Arthritis   . Hypertension 2000  . Spinal stenosis, lumbar region, without neurogenic claudication 2012  . Thyroid disease 2013    thyroid nodule with needle aspiration    Past Surgical History  Procedure Laterality Date  . Tonsillectomy      age 23  . Thyroid lobectomy  age 51  . Back surgery  11/08/2010    lumbar-bulging disc  . Tubal ligation    . Cataract extraction      left eye-2011-Dr Hunt  . Cataract extraction w/phaco  06/09/2012    Procedure: CATARACT EXTRACTION PHACO AND INTRAOCULAR LENS PLACEMENT (IOC);  Surgeon: Tonny Branch, MD;  Location: [facility];  Service: Ophthalmology;  Laterality: Left;  CDE=13.91  . Skin cancer removal N/A 09/10/13    Current Outpatient Prescriptions  Medication Sig Dispense Refill  . ALPRAZolam (XANAX) 0.25 MG tablet Take 1 tablet (0.25 mg total) by mouth at bedtime as needed for anxiety.  30 tablet  0  . atenolol (TENORMIN) 25 MG tablet Take 1 tablet (25 mg total)  by mouth daily.  30 tablet  2  . Calcium Carbonate-Vitamin D (CALCIUM-D) 600-400 MG-UNIT TABS Take 1 tablet by mouth daily.      Marland Kitchen conjugated estrogens (PREMARIN) vaginal cream Place 0.5 Applicatorfuls vaginally 2 (two) times a week.  94.7 g  12  . folic acid (FOLVITE) 096 MCG tablet Take 800 mcg by mouth daily.      . magnesium oxide (MAG-OX) 400 MG tablet Take 400 mg by mouth 2 (two) times daily.      . Multiple Vitamin (MULTIVITAMIN WITH MINERALS) TABS Take 1 tablet by mouth daily.      . Omega-3 Fatty Acids (FISH OIL) 1200 MG CAPS Take 1,200 mg by mouth daily.      Marland Kitchen omeprazole (PRILOSEC) 40 MG capsule Take 1 capsule (40 mg total) by mouth daily.  30 capsule  2  . pravastatin (PRAVACHOL) 40 MG tablet TAKE ONE TABLET BY MOUTH ONE TIME DAILY  90 tablet  1  . psyllium (METAMUCIL) 58.6 % powder Take 1 packet by mouth 2 (two) times daily.       No current facility-administered medications for this visit.    Family History  Problem Relation Age of Onset  . Hypertension Mother   . Anxiety disorder Mother   . Breast cancer Mother 61    metasis to lung and  bones  . Hypertension Father   . COPD Father   . Hypertension Maternal Grandmother   . Hypertension Paternal Grandmother   . Heart disease Paternal Grandmother   . Breast cancer Maternal Aunt   . Cancer Maternal Uncle     X 5 with various cancers  . Stroke Maternal Grandfather   . COPD Paternal Grandfather     ROS:  Pertinent items are noted in HPI.  Otherwise, a comprehensive ROS was negative.  Exam:   BP 146/80  Pulse 68  Ht 5\' 4"  (1.626 m)  Wt 134 lb (60.782 kg)  BMI 22.99 kg/m2 Height: 5\' 4"  (162.6 cm)  Ht Readings from Last 3 Encounters:  11/17/13 5\' 4"  (1.626 m)  08/17/13 5\' 4"  (1.626 m)  04/13/13 5\' 4"  (1.626 m)    General appearance: alert, cooperative and appears stated age Head: Normocephalic, without obvious abnormality, atraumatic Neck: no adenopathy, supple, symmetrical, trachea midline and thyroid normal to  inspection and palpation Lungs: clear to auscultation bilaterally Breasts: normal appearance, no masses or tenderness Heart: regular rate and rhythm Abdomen: soft, non-tender; no masses,  no organomegaly Extremities: extremities normal, atraumatic, no cyanosis or edema Skin: Skin color, texture, turgor normal. No rashes or lesions Lymph nodes: Cervical, supraclavicular, and axillary nodes normal. No abnormal inguinal nodes palpated Neurologic: Grossly normal   Pelvic: External genitalia:  no lesions              Urethra:  normal appearing urethra with no masses, tenderness or lesions              Bartholin's and Skene's: normal                 Vagina: normal appearing vagina with normal color and discharge, no lesions              Cervix: anteverted              Pap taken: No. Bimanual Exam:  Uterus:  normal size, contour, position, consistency, mobility, non-tender              Adnexa: no mass, fullness, tenderness               Rectovaginal: Confirms               Anus:  normal sphincter tone, no lesions  A:  Well Woman with normal exam  Postmenopausal   Atrophic vaginitis - better with Premarin vaginal cream  P:   Reviewed health and wellness pertinent to exam  Pap smear not taken today  Mammogram is due 05/2014  Refilled on Premarin cream for a year - again cautioned with risk of CVA, DVT, cancer, etc.  Counseled on breast self exam, mammography screening, use and side effects of HRT, menopause, osteoporosis, adequate intake of calcium and vitamin D, diet and exercise, Kegel's exercises return annually or prn  An After Visit Summary was printed and given to the patient.

## 2013-11-20 NOTE — Progress Notes (Signed)
Encounter reviewed by Dr. Brook Silva.  

## 2013-12-18 ENCOUNTER — Ambulatory Visit (INDEPENDENT_AMBULATORY_CARE_PROVIDER_SITE_OTHER): Payer: Medicare Other | Admitting: Family Medicine

## 2013-12-18 ENCOUNTER — Encounter: Payer: Self-pay | Admitting: Family Medicine

## 2013-12-18 VITALS — BP 160/73 | HR 66 | Temp 98.1°F | Ht 64.0 in | Wt 132.0 lb

## 2013-12-18 DIAGNOSIS — K219 Gastro-esophageal reflux disease without esophagitis: Secondary | ICD-10-CM

## 2013-12-18 DIAGNOSIS — I10 Essential (primary) hypertension: Secondary | ICD-10-CM

## 2013-12-18 DIAGNOSIS — E785 Hyperlipidemia, unspecified: Secondary | ICD-10-CM

## 2013-12-18 MED ORDER — RANITIDINE HCL 150 MG PO CAPS: 150.0000 mg | ORAL_CAPSULE | Freq: Two times a day (BID) | ORAL | Status: DC

## 2013-12-18 NOTE — Patient Instructions (Signed)

## 2013-12-18 NOTE — Progress Notes (Signed)
   Subjective:    Patient ID: Shelly Nelson, female    DOB: Jan 15, 1930, 80 y.o.   MRN: [id number]  HPI pleasant 80 year old female who is here for routine followup of her blood pressure she brings a record of her blood pressure monitoring from home at all his numbers are good. It is slightly elevated here today but she admits to anxiety and possible white coat syndrome. We spent some time talking about her other problems such as atrophic vaginitis GERD thyroid nodules, but generally she is doing really well.    Review of Systems  Constitutional: Negative.   HENT: Negative.   Eyes: Negative.   Respiratory: Negative.   Cardiovascular: Negative.   Gastrointestinal: Negative.   Endocrine: Negative.   Genitourinary: Negative.   Musculoskeletal: Positive for myalgias.       Bilateral thumb pain  Skin: Rash: not true rash but healing intertrigo.  Hematological: Negative.   Psychiatric/Behavioral: Negative.        Objective:   Physical Exam  Constitutional: She is oriented to person, place, and time. She appears well-developed and well-nourished.  Eyes: Conjunctivae and EOM are normal.  Neck: Normal range of motion. Neck supple.  Cardiovascular: Normal rate, regular rhythm and normal heart sounds.   Pulmonary/Chest: Effort normal and breath sounds normal.  Abdominal: Soft. Bowel sounds are normal.  Musculoskeletal: Normal range of motion.  Neurological: She is alert and oriented to person, place, and time. She has normal reflexes.  Skin: Skin is warm and dry.  Psychiatric: She has a normal mood and affect. Her behavior is normal. Thought content normal.   BP 160/73  Pulse 66  Temp(Src) 98.1 F (36.7 C) (Oral)  Ht 5\' 4"  (1.626 m)  Wt 132 lb (59.875 kg)  BMI 22.65 kg/m2        Assessment & Plan:  1. Essential hypertension As noted, BP's at home are good, although atenolol not a true once day med  2. HLD (hyperlipidemia) Tolerates pravaststin; recheck lipids in 9  months  3. Gastroesophageal reflux disease, esophagitis presence not specified Takes PPI daily; will try H2 blocker, ranitidine instead  Wardell Honour MD

## 2013-12-22 ENCOUNTER — Other Ambulatory Visit: Payer: Self-pay | Admitting: *Deleted

## 2013-12-22 DIAGNOSIS — IMO0001 Reserved for inherently not codable concepts without codable children: Secondary | ICD-10-CM

## 2013-12-22 MED ORDER — ALPRAZOLAM 0.25 MG PO TABS: 0.2500 mg | ORAL_TABLET | Freq: Every evening | ORAL | Status: DC | PRN

## 2013-12-22 NOTE — Telephone Encounter (Signed)
Called in.

## 2013-12-22 NOTE — Telephone Encounter (Signed)
Last ov 12/18/13 with Dr. Sabra Heck. Dr. Sabra Heck is off all week. Can you review refill request please. Last refill 12/14. If aproved call to [facility].

## 2014-02-01 ENCOUNTER — Ambulatory Visit
Admission: RE | Admit: 2014-02-01 | Discharge: 2014-02-01 | Disposition: A | Discharge status: Home / Self Care | Payer: Medicare Other | Source: Ambulatory Visit | Attending: Internal Medicine | Admitting: Internal Medicine

## 2014-02-01 ENCOUNTER — Other Ambulatory Visit: Payer: Self-pay | Admitting: Internal Medicine

## 2014-02-01 DIAGNOSIS — E049 Nontoxic goiter, unspecified: Secondary | ICD-10-CM

## 2014-02-02 ENCOUNTER — Other Ambulatory Visit: Payer: Self-pay | Admitting: Family Medicine

## 2014-02-04 ENCOUNTER — Other Ambulatory Visit: Payer: Medicare Other

## 2014-02-11 ENCOUNTER — Ambulatory Visit: Payer: Medicare Other

## 2014-02-18 ENCOUNTER — Other Ambulatory Visit (INDEPENDENT_AMBULATORY_CARE_PROVIDER_SITE_OTHER): Payer: Medicare Other | Admitting: *Deleted

## 2014-02-18 ENCOUNTER — Ambulatory Visit (INDEPENDENT_AMBULATORY_CARE_PROVIDER_SITE_OTHER): Payer: Medicare Other

## 2014-02-18 DIAGNOSIS — Z23 Encounter for immunization: Secondary | ICD-10-CM

## 2014-03-02 ENCOUNTER — Other Ambulatory Visit: Payer: Self-pay | Admitting: *Deleted

## 2014-03-02 MED ORDER — PRAVASTATIN SODIUM 40 MG PO TABS: ORAL_TABLET | ORAL | Status: DC

## 2014-03-02 NOTE — Telephone Encounter (Signed)
Last lipid 4/15.

## 2014-04-20 ENCOUNTER — Other Ambulatory Visit: Payer: Self-pay

## 2014-04-20 DIAGNOSIS — Z1231 Encounter for screening mammogram for malignant neoplasm of breast: Secondary | ICD-10-CM

## 2014-05-20 ENCOUNTER — Ambulatory Visit
Admission: RE | Admit: 2014-05-20 | Discharge: 2014-05-20 | Disposition: A | Discharge status: Home / Self Care | Payer: Medicare Other | Source: Ambulatory Visit

## 2014-05-20 DIAGNOSIS — Z1231 Encounter for screening mammogram for malignant neoplasm of breast: Secondary | ICD-10-CM

## 2014-06-07 ENCOUNTER — Other Ambulatory Visit: Payer: Self-pay | Admitting: Family Medicine

## 2014-07-05 ENCOUNTER — Other Ambulatory Visit: Payer: Self-pay | Admitting: Family Medicine

## 2014-08-26 ENCOUNTER — Telehealth: Payer: Self-pay | Admitting: Nurse Practitioner

## 2014-08-26 NOTE — Telephone Encounter (Signed)
Left message regarding upcoming appointment has been canceled and needs to be rescheduled. °

## 2014-09-03 ENCOUNTER — Encounter: Payer: Self-pay | Admitting: Family Medicine

## 2014-09-03 ENCOUNTER — Ambulatory Visit (INDEPENDENT_AMBULATORY_CARE_PROVIDER_SITE_OTHER): Payer: Medicare Other | Admitting: Family Medicine

## 2014-09-03 VITALS — BP 139/73 | HR 61 | Temp 97.7°F | Ht 64.0 in | Wt 130.0 lb

## 2014-09-03 DIAGNOSIS — I10 Essential (primary) hypertension: Secondary | ICD-10-CM | POA: Diagnosis not present

## 2014-09-03 DIAGNOSIS — K219 Gastro-esophageal reflux disease without esophagitis: Secondary | ICD-10-CM | POA: Diagnosis not present

## 2014-09-03 DIAGNOSIS — E785 Hyperlipidemia, unspecified: Secondary | ICD-10-CM

## 2014-09-03 MED ORDER — OMEPRAZOLE 40 MG PO CPDR: 40.0000 mg | DELAYED_RELEASE_CAPSULE | Freq: Every day | ORAL | Status: DC

## 2014-09-03 MED ORDER — ATENOLOL 25 MG PO TABS: 25.0000 mg | ORAL_TABLET | Freq: Every day | ORAL | Status: DC

## 2014-09-03 MED ORDER — PRAVASTATIN SODIUM 40 MG PO TABS: 40.0000 mg | ORAL_TABLET | Freq: Every day | ORAL | Status: DC

## 2014-09-03 NOTE — Progress Notes (Signed)
Subjective:    Patient ID: Shelly Nelson, female    DOB: 1929/07/29, 81 y.o.   MRN: [id number]  HPI 81 year old female here to follow-up hypertension and hyperlipidemia. She has no specific complaints or symptoms today she continues to take atenolol for her blood pressure and pravastatin for her lipids.  She does complain of some occasional insomnia. We discussed sleep hygiene. She does not use caffeine. She has taken occasional Xanax at bedtime to help induce sleep  Chief Complaint  Patient presents with  . Hypertension  . Hyperlipidemia  . Gastrophageal Reflux   Patient Active Problem List   Diagnosis Date Noted  . HTN (hypertension) 09/09/2012  . HLD (hyperlipidemia) 09/09/2012  . GERD (gastroesophageal reflux disease) 09/09/2012  . Postmenopausal 09/09/2012  . Multinodular goiter 09/09/2012  . White coat hypertension 09/09/2012  . Perimenopausal atrophic vaginitis 09/09/2012  . Diverticulosis of colon without hemorrhage 09/09/2012   Outpatient Encounter Prescriptions as of 09/03/2014  Medication Sig  . ALPRAZolam (XANAX) 0.25 MG tablet Take 1 tablet (0.25 mg total) by mouth at bedtime as needed for anxiety.  Marland Kitchen atenolol (TENORMIN) 25 MG tablet TAKE ONE TABLET BY MOUTH ONE TIME DAILY  . Calcium Carbonate-Vitamin D (CALCIUM-D) 600-400 MG-UNIT TABS Take 1 tablet by mouth daily.  Marland Kitchen conjugated estrogens (PREMARIN) vaginal cream Place 0.5 Applicatorfuls vaginally 2 (two) times a week.  . folic acid (FOLVITE) 654 MCG tablet Take 800 mcg by mouth daily.  . magnesium oxide (MAG-OX) 400 MG tablet Take 400 mg by mouth 2 (two) times daily.  . Multiple Vitamin (MULTIVITAMIN WITH MINERALS) TABS Take 1 tablet by mouth daily.  . Omega-3 Fatty Acids (FISH OIL) 1200 MG CAPS Take 1,200 mg by mouth daily.  Marland Kitchen omeprazole (PRILOSEC) 40 MG capsule TAKE ONE CAPSULE BY MOUTH ONE TIME DAILY  . pravastatin (PRAVACHOL) 40 MG tablet TAKE ONE TABLET BY MOUTH ONE TIME DAILY  . psyllium (METAMUCIL) 58.6  % powder Take 1 packet by mouth 2 (two) times daily.  . ranitidine (ZANTAC) 150 MG capsule Take 1 capsule (150 mg total) by mouth 2 (two) times daily.     Review of Systems  Constitutional: Negative.   HENT: Negative.   Eyes: Negative.   Respiratory: Negative.   Cardiovascular: Negative.   Gastrointestinal: Negative.   Endocrine: Negative.   Genitourinary: Negative.   Hematological: Negative.   Psychiatric/Behavioral: Negative.        Objective:   Physical Exam  Constitutional: She is oriented to person, place, and time. She appears well-developed and well-nourished.  Cardiovascular: Normal rate, regular rhythm, normal heart sounds and intact distal pulses.   Pulmonary/Chest: Effort normal and breath sounds normal.  Abdominal: Soft.  Neurological: She is alert and oriented to person, place, and time. She has normal reflexes.    BP 139/73 mmHg  Pulse 61  Temp(Src) 97.7 F (36.5 C) (Oral)  Ht 5' 4" (1.626 m)  Wt 130 lb (58.968 kg)  BMI 22.30 kg/m2       Assessment & Plan:  1. Essential hypertension Blood pressure is controlled with atenolol. Even though I would like to change her to a true 24 hour drug she is happy with atenolol and will continue same  2. HLD (hyperlipidemia) All rates pravastatin better than atorvastatin it has been 1 year since we last checked lipids and liver - Lipid panel - CMP14+EGFR  3. Gastroesophageal reflux disease, esophagitis pr  Wardell Honour MDence not specified Symptoms are managed with omeprazole. We will refill today

## 2014-09-04 LAB — LIPID PANEL
CHOLESTEROL TOTAL: 176 mg/dL (ref 100–199)
Chol/HDL Ratio: 2.1 ratio units (ref 0.0–4.4)
HDL: 83 mg/dL (ref >39)
LDL CALC: 80 mg/dL (ref 0–99)
Triglycerides: 67 mg/dL (ref 0–149)
VLDL Cholesterol Cal: 13 mg/dL (ref 5–40)

## 2014-09-04 LAB — CMP14+EGFR
A/G RATIO: 2 (ref 1.1–2.5)
ALT: 23 IU/L (ref 0–32)
AST: 40 IU/L (ref 0–40)
Albumin: 4.3 g/dL (ref 3.5–4.7)
Alkaline Phosphatase: 58 IU/L (ref 39–117)
BUN / CREAT RATIO: 11 (ref 11–26)
BUN: 11 mg/dL (ref 8–27)
Bilirubin Total: 0.5 mg/dL (ref 0.0–1.2)
CALCIUM: 9.4 mg/dL (ref 8.7–10.3)
CHLORIDE: 100 mmol/L (ref 97–108)
CO2: 26 mmol/L (ref 18–29)
Creatinine, Ser: 1.01 mg/dL — ABNORMAL HIGH (ref 0.57–1.00)
GFR calc Af Amer: 60 mL/min/{1.73_m2} (ref >59)
GFR calc non Af Amer: 52 mL/min/{1.73_m2} — ABNORMAL LOW (ref >59)
Globulin, Total: 2.1 g/dL (ref 1.5–4.5)
Glucose: 93 mg/dL (ref 65–99)
POTASSIUM: 4.6 mmol/L (ref 3.5–5.2)
Sodium: 140 mmol/L (ref 134–144)
Total Protein: 6.4 g/dL (ref 6.0–8.5)

## 2014-11-25 ENCOUNTER — Ambulatory Visit: Payer: Medicare Other | Admitting: Nurse Practitioner

## 2014-12-08 ENCOUNTER — Encounter: Payer: Self-pay | Admitting: *Deleted

## 2014-12-08 ENCOUNTER — Ambulatory Visit (INDEPENDENT_AMBULATORY_CARE_PROVIDER_SITE_OTHER): Payer: Medicare Other | Admitting: Nurse Practitioner

## 2014-12-08 VITALS — BP 132/84 | HR 64 | Ht 64.0 in | Wt 132.0 lb

## 2014-12-08 DIAGNOSIS — M8580 Other specified disorders of bone density and structure, unspecified site: Secondary | ICD-10-CM

## 2014-12-08 DIAGNOSIS — E559 Vitamin D deficiency, unspecified: Secondary | ICD-10-CM

## 2014-12-08 DIAGNOSIS — Z01419 Encounter for gynecological examination (general) (routine) without abnormal findings: Secondary | ICD-10-CM | POA: Diagnosis not present

## 2014-12-08 MED ORDER — ESTROGENS, CONJUGATED 0.625 MG/GM VA CREA: 1.0000 g | TOPICAL_CREAM | VAGINAL | Status: DC

## 2014-12-08 NOTE — Patient Instructions (Signed)

## 2014-12-08 NOTE — Progress Notes (Signed)
Patient ID: Shelly Nelson, female   DOB: [birth year], 81 y.o.   MRN: [id number] 81 y.o. G36P1001 Married Caucasian Fe here for annual exam.  No new health problems.  Using only small dose of Premarin cream to urethra. herhusband is doing Ok since his prostate cancer.  Patient's last menstrual period was 05/14/2001 (approximate).          Sexually active: No.  The current method of family planning is none.    Exercising: Yes.    walking every morning but Sunday Smoker:  no  Health Maintenance: Pap: 08/2010, Normal per patient MMG: 05/20/14, Bi-Rads 1: negative Colonoscopy: 02/2012 normal, previous polyp history  BMD: Heel screen at [facility] in 2013 normal  TDaP: 09/2012  Shingles: 02/12/2008 Prevnar 02/18/14 Labs:  08/2014 with PCP in [facility]   reports that she has never smoked. She has never used smokeless tobacco. She reports that she does not drink alcohol or use illicit drugs.  Past Medical History  Diagnosis Date  . GERD (gastroesophageal reflux disease) 2012  . Hypercholesteremia 2000  . Anxiety   . Arthritis   . Hypertension 2000  . Spinal stenosis, lumbar region, without neurogenic claudication 2012  . Thyroid disease 2013    thyroid nodule with needle aspiration  . Squamous cell carcinoma   . Basal cell carcinoma     Past Surgical History  Procedure Laterality Date  . Tonsillectomy      age 62  . Thyroid lobectomy  age 73  . Back surgery  11/08/2010    lumbar-bulging disc  . Tubal ligation    . Cataract extraction      left eye-2011-Dr Hunt  . Cataract extraction w/phaco  06/09/2012    Procedure: CATARACT EXTRACTION PHACO AND INTRAOCULAR LENS PLACEMENT (IOC);  Surgeon: Tonny Branch, MD;  Location: [facility];  Service: Ophthalmology;  Laterality: Left;  CDE=13.91  . Skin cancer removal N/A 09/10/13    Current Outpatient Prescriptions  Medication Sig Dispense Refill  . ALPRAZolam (XANAX) 0.25 MG tablet Take 1 tablet (0.25 mg total) by mouth at bedtime as needed for anxiety.  30 tablet 1  . atenolol (TENORMIN) 25 MG tablet Take 1 tablet (25 mg total) by mouth daily. 90 tablet 1  . Calcium Carbonate-Vitamin D (CALCIUM-D) 600-400 MG-UNIT TABS Take 1 tablet by mouth daily.    Marland Kitchen conjugated estrogens (PREMARIN) vaginal cream Place 0.5 Applicatorfuls vaginally 2 (two) times a week. 42.5 g 12  . docusate sodium (DULCOLAX) 100 MG capsule Take 100 mg by mouth daily.    . folic acid (FOLVITE) 528 MCG tablet Take 800 mcg by mouth daily.    . Garlic 4132 MG CAPS Take 1 capsule by mouth daily.    . magnesium oxide (MAG-OX) 400 MG tablet Take 400 mg by mouth 2 (two) times daily.    . Multiple Vitamin (MULTIVITAMIN WITH MINERALS) TABS Take 1 tablet by mouth daily.    . Omega-3 Fatty Acids (FISH OIL) 1200 MG CAPS Take 1,200 mg by mouth daily.    Marland Kitchen omeprazole (PRILOSEC) 40 MG capsule Take 1 capsule (40 mg total) by mouth daily. 90 capsule 1  . pravastatin (PRAVACHOL) 40 MG tablet Take 1 tablet (40 mg total) by mouth daily. 90 tablet 1  . psyllium (METAMUCIL) 58.6 % powder Take 1 packet by mouth 2 (two) times daily.     No current facility-administered medications for this visit.    Family History  Problem Relation Age of Onset  . Hypertension Mother   .  Anxiety disorder Mother   . Breast cancer Mother 66    metasis to lung and bones  . Hypertension Father   . COPD Father   . Hypertension Maternal Grandmother   . Hypertension Paternal Grandmother   . Heart disease Paternal Grandmother   . Breast cancer Maternal Aunt   . Cancer Maternal Uncle     X 5 with various cancers  . Stroke Maternal Grandfather   . COPD Paternal Grandfather   . Diabetes Maternal Grandmother     type 2    ROS:  Pertinent items are noted in HPI.  Otherwise, a comprehensive ROS was negative.  Exam:   BP 132/84 mmHg  Pulse 64  Ht 5\' 4"  (1.626 m)  Wt 132 lb (59.875 kg)  BMI 22.65 kg/m2  LMP 05/14/2001 (Approximate) Height: 5\' 4"  (162.6 cm) Ht Readings from Last 3 Encounters:  12/08/14 5\' 4"   (1.626 m)  09/03/14 5\' 4"  (1.626 m)  12/18/13 5\' 4"  (1.626 m)    General appearance: alert, cooperative and appears stated age Head: Normocephalic, without obvious abnormality, atraumatic Neck: no adenopathy, supple, symmetrical, trachea midline and thyroid normal to inspection and palpation Lungs: clear to auscultation bilaterally Breasts: normal appearance, no masses or tenderness Heart: regular rate and rhythm Abdomen: soft, non-tender; no masses,  no organomegaly Extremities: extremities normal, atraumatic, no cyanosis or edema Skin: Skin color, texture, turgor normal. No rashes or lesions Lymph nodes: Cervical, supraclavicular, and axillary nodes normal. No abnormal inguinal nodes palpated Neurologic: Grossly normal   Pelvic: External genitalia:  no lesions, several inclusion cyst were expressed with a lot of exudate.              Urethra:  normal appearing urethra with no masses, tenderness or lesions              Bartholin's and Skene's: normal                 Vagina: atrophic appearing vagina with normal color and discharge, no lesions              Cervix: anteverted              Pap taken: No. Bimanual Exam:  Uterus:  normal size, contour, position, consistency, mobility, non-tender              Adnexa: no mass, fullness, tenderness               Rectovaginal: Confirms               Anus:  normal sphincter tone, no lesions  Chaperone present:  no  A:  Well Woman with normal exam  Postmenopausal Atrophic vaginitis doing better on Estrogen Vaginal cream Mild stable SUI followed by Dr. Matilde Sprang History of anxiety, GERD, Hypercortisolemia  P:   Reviewed health and wellness pertinent to exam  Pap smear as above  Mammogram is due in 05/2015, will send order for BMD  Will recheck Vit D and follow   Will refill premarin vaginal cream to the urethrra - but does not need refill yet  Counseled on breast self exam, mammography screening,  use and side effects of HRT, adequate intake of calcium and vitamin D, diet and exercise return annually or prn  An After Visit Summary was printed and given to the patient.

## 2014-12-09 LAB — VITAMIN D 25 HYDROXY (VIT D DEFICIENCY, FRACTURES): VIT D 25 HYDROXY: 42 ng/mL (ref 30–100)

## 2014-12-11 NOTE — Progress Notes (Signed)
Encounter reviewed by Dr. Brook Amundson C. Silva.  

## 2015-01-10 DIAGNOSIS — L603 Nail dystrophy: Secondary | ICD-10-CM | POA: Diagnosis not present

## 2015-01-10 DIAGNOSIS — Z08 Encounter for follow-up examination after completed treatment for malignant neoplasm: Secondary | ICD-10-CM | POA: Diagnosis not present

## 2015-01-10 DIAGNOSIS — Z85828 Personal history of other malignant neoplasm of skin: Secondary | ICD-10-CM | POA: Diagnosis not present

## 2015-01-10 DIAGNOSIS — L820 Inflamed seborrheic keratosis: Secondary | ICD-10-CM | POA: Diagnosis not present

## 2015-01-10 DIAGNOSIS — L570 Actinic keratosis: Secondary | ICD-10-CM | POA: Diagnosis not present

## 2015-01-10 DIAGNOSIS — X32XXXD Exposure to sunlight, subsequent encounter: Secondary | ICD-10-CM | POA: Diagnosis not present

## 2015-01-20 DIAGNOSIS — H524 Presbyopia: Secondary | ICD-10-CM | POA: Diagnosis not present

## 2015-01-20 DIAGNOSIS — H35373 Puckering of macula, bilateral: Secondary | ICD-10-CM | POA: Diagnosis not present

## 2015-01-20 DIAGNOSIS — Z961 Presence of intraocular lens: Secondary | ICD-10-CM | POA: Diagnosis not present

## 2015-01-29 DIAGNOSIS — R21 Rash and other nonspecific skin eruption: Secondary | ICD-10-CM | POA: Diagnosis not present

## 2015-01-31 ENCOUNTER — Telehealth: Payer: Self-pay | Admitting: Family Medicine

## 2015-01-31 NOTE — Telephone Encounter (Signed)
Patient aware that she does not have to follow up if she is doing better. Patient states that she will call us back if she needs to be seen.

## 2015-02-09 DIAGNOSIS — E042 Nontoxic multinodular goiter: Secondary | ICD-10-CM | POA: Diagnosis not present

## 2015-02-16 ENCOUNTER — Ambulatory Visit (INDEPENDENT_AMBULATORY_CARE_PROVIDER_SITE_OTHER): Payer: Medicare Other

## 2015-02-16 DIAGNOSIS — Z23 Encounter for immunization: Secondary | ICD-10-CM | POA: Diagnosis not present

## 2015-03-09 ENCOUNTER — Other Ambulatory Visit: Payer: Self-pay | Admitting: Family Medicine

## 2015-03-09 ENCOUNTER — Ambulatory Visit (INDEPENDENT_AMBULATORY_CARE_PROVIDER_SITE_OTHER): Payer: Medicare Other | Admitting: Family Medicine

## 2015-03-09 ENCOUNTER — Encounter: Payer: Self-pay | Admitting: Family Medicine

## 2015-03-09 VITALS — BP 161/82 | HR 53 | Temp 98.0°F | Ht 64.0 in | Wt 131.6 lb

## 2015-03-09 DIAGNOSIS — E785 Hyperlipidemia, unspecified: Secondary | ICD-10-CM | POA: Diagnosis not present

## 2015-03-09 DIAGNOSIS — R030 Elevated blood-pressure reading, without diagnosis of hypertension: Secondary | ICD-10-CM | POA: Diagnosis not present

## 2015-03-09 DIAGNOSIS — IMO0001 Reserved for inherently not codable concepts without codable children: Secondary | ICD-10-CM

## 2015-03-09 DIAGNOSIS — E042 Nontoxic multinodular goiter: Secondary | ICD-10-CM | POA: Diagnosis not present

## 2015-03-09 MED ORDER — OMEPRAZOLE 40 MG PO CPDR
40.0000 mg | DELAYED_RELEASE_CAPSULE | Freq: Every day | ORAL | Status: DC
Start: 2015-03-09 — End: 2015-06-12

## 2015-03-09 MED ORDER — ATENOLOL 25 MG PO TABS: 25.0000 mg | ORAL_TABLET | Freq: Every day | ORAL | Status: DC

## 2015-03-09 MED ORDER — PRAVASTATIN SODIUM 40 MG PO TABS: 40.0000 mg | ORAL_TABLET | Freq: Every day | ORAL | Status: DC

## 2015-03-09 MED ORDER — ALPRAZOLAM 0.25 MG PO TABS
0.2500 mg | ORAL_TABLET | Freq: Every evening | ORAL | Status: DC | PRN
Start: 2015-03-09 — End: 2015-09-08

## 2015-03-09 NOTE — Progress Notes (Signed)
   Subjective:    Patient ID: Shelly Nelson, female    DOB: 12/01/1929, 81 y.o.   MRN: [id number]  HPI 81-year-old female here to follow-up high blood pressure, lipids,. She has no specific complaints today. For her age she isn't really good health. Her mother had breast cancer that was discovered at age 81 and so Shelly Nelson still gets mammograms. She saw her endocrinologist in September who suggested that she get a TSH at next time she saw a primary care so we will do that today.  Patient Active Problem List   Diagnosis Date Noted  . HTN (hypertension) 09/09/2012  . HLD (hyperlipidemia) 09/09/2012  . GERD (gastroesophageal reflux disease) 09/09/2012  . Postmenopausal 09/09/2012  . Multinodular goiter 09/09/2012  . White coat hypertension 09/09/2012  . Perimenopausal atrophic vaginitis 09/09/2012  . Diverticulosis of colon without hemorrhage 09/09/2012   Outpatient Encounter Prescriptions as of 03/09/2015  Medication Sig  . ALPRAZolam (XANAX) 0.25 MG tablet Take 1 tablet (0.25 mg total) by mouth at bedtime as needed for anxiety.  . atenolol (TENORMIN) 25 MG tablet Take 1 tablet (25 mg total) by mouth daily.  . Calcium Carbonate-Vitamin D (CALCIUM-D) 600-400 MG-UNIT TABS Take 1 tablet by mouth daily.  . conjugated estrogens (PREMARIN) vaginal cream Place 0.5 Applicatorfuls vaginally 2 (two) times a week.  . docusate sodium (DULCOLAX) 100 MG capsule Take 100 mg by mouth daily.  . folic acid (FOLVITE) 800 MCG tablet Take 800 mcg by mouth daily.  . Garlic 1000 MG CAPS Take 1 capsule by mouth daily.  . magnesium oxide (MAG-OX) 400 MG tablet Take 400 mg by mouth 2 (two) times daily.  . Multiple Vitamin (MULTIVITAMIN WITH MINERALS) TABS Take 1 tablet by mouth daily.  . Omega-3 Fatty Acids (FISH OIL) 1200 MG CAPS Take 1,200 mg by mouth daily.  . omeprazole (PRILOSEC) 40 MG capsule Take 1 capsule (40 mg total) by mouth daily.  . pravastatin (PRAVACHOL) 40 MG tablet Take 1 tablet (40 mg total)  by mouth daily.  . psyllium (METAMUCIL) 58.6 % powder Take 1 packet by mouth 2 (two) times daily.   No facility-administered encounter medications on file as of 03/09/2015.      Review of Systems  Constitutional: Negative.   Respiratory: Negative.   Cardiovascular: Negative.   Musculoskeletal: Positive for back pain.  Psychiatric/Behavioral: Negative.        Objective:   Physical Exam  Constitutional: She appears well-developed and well-nourished.  Cardiovascular: Normal rate, regular rhythm and normal heart sounds.   Pulmonary/Chest: Effort normal and breath sounds normal.  Neurological: She is alert.  Psychiatric: She has a normal mood and affect.          Assessment & Plan:  1. White coat hypertension Blood pressures at home have all been good and I'm inclined to accept those. I did suggest she could break her atenolol in half and take half in morning and half in the evening for better coverage - ALPRAZolam (XANAX) 0.25 MG tablet; Take 1 tablet (0.25 mg total) by mouth at bedtime as needed for anxiety.  Dispense: 30 tablet; Refill: 1 - CMP14+EGFR  2. HLD (hyperlipidemiaLipids have been good in the past on no medication - Lipid panel  3. Multinodular goiter Her endocrinologist has suggested to check TSH today - TSH  Stephen M Miller MD  

## 2015-03-10 LAB — CMP14+EGFR
ALBUMIN: 4.2 g/dL (ref 3.5–4.7)
ALT: 29 IU/L (ref 0–32)
AST: 37 IU/L (ref 0–40)
Albumin/Globulin Ratio: 2 (ref 1.1–2.5)
Alkaline Phosphatase: 53 IU/L (ref 39–117)
BUN / CREAT RATIO: 10 — AB (ref 11–26)
BUN: 10 mg/dL (ref 8–27)
Bilirubin Total: 0.4 mg/dL (ref 0.0–1.2)
CO2: 28 mmol/L (ref 18–29)
CREATININE: 0.96 mg/dL (ref 0.57–1.00)
Calcium: 9.3 mg/dL (ref 8.7–10.3)
Chloride: 99 mmol/L (ref 97–106)
GFR calc Af Amer: 64 mL/min/{1.73_m2} (ref >59)
GFR calc non Af Amer: 55 mL/min/{1.73_m2} — ABNORMAL LOW (ref >59)
GLUCOSE: 93 mg/dL (ref 65–99)
Globulin, Total: 2.1 g/dL (ref 1.5–4.5)
Potassium: 4.5 mmol/L (ref 3.5–5.2)
Sodium: 143 mmol/L (ref 136–144)
TOTAL PROTEIN: 6.3 g/dL (ref 6.0–8.5)

## 2015-03-10 LAB — LIPID PANEL
Chol/HDL Ratio: 2.2 ratio units (ref 0.0–4.4)
Cholesterol, Total: 181 mg/dL (ref 100–199)
HDL: 81 mg/dL (ref >39)
LDL Calculated: 85 mg/dL (ref 0–99)
Triglycerides: 76 mg/dL (ref 0–149)
VLDL Cholesterol Cal: 15 mg/dL (ref 5–40)

## 2015-03-10 LAB — TSH: TSH: 2.82 u[IU]/mL (ref 0.450–4.500)

## 2015-03-11 ENCOUNTER — Other Ambulatory Visit: Payer: Self-pay | Admitting: Family Medicine

## 2015-03-11 ENCOUNTER — Telehealth: Payer: Self-pay | Admitting: Family Medicine

## 2015-04-04 ENCOUNTER — Ambulatory Visit (INDEPENDENT_AMBULATORY_CARE_PROVIDER_SITE_OTHER): Payer: Medicare Other | Admitting: Family Medicine

## 2015-04-04 ENCOUNTER — Encounter: Payer: Self-pay | Admitting: Family Medicine

## 2015-04-04 VITALS — BP 164/81 | HR 88 | Temp 100.6°F | Ht 64.0 in | Wt 133.0 lb

## 2015-04-04 DIAGNOSIS — J0101 Acute recurrent maxillary sinusitis: Secondary | ICD-10-CM | POA: Diagnosis not present

## 2015-04-04 DIAGNOSIS — R05 Cough: Secondary | ICD-10-CM | POA: Diagnosis not present

## 2015-04-04 DIAGNOSIS — R059 Cough, unspecified: Secondary | ICD-10-CM

## 2015-04-04 DIAGNOSIS — J029 Acute pharyngitis, unspecified: Secondary | ICD-10-CM | POA: Diagnosis not present

## 2015-04-04 DIAGNOSIS — I10 Essential (primary) hypertension: Secondary | ICD-10-CM

## 2015-04-04 LAB — POCT INFLUENZA A/B
Influenza A, POC: NEGATIVE
Influenza B, POC: NEGATIVE

## 2015-04-04 LAB — POCT RAPID STREP A (OFFICE): RAPID STREP A SCREEN: NEGATIVE

## 2015-04-04 MED ORDER — AMOXICILLIN-POT CLAVULANATE 875-125 MG PO TABS: 1.0000 | ORAL_TABLET | Freq: Two times a day (BID) | ORAL | Status: DC

## 2015-04-04 NOTE — Progress Notes (Addendum)
Subjective:  Patient ID: Shelly Nelson, female    DOB: 1930-03-30  Age: 81 y.o. MRN: [id number]  CC: URI   HPI Shelly Nelson presents for Patient presents with upper respiratory congestion. Worst around the nose. Rhinorrhea that is frequently purulent. There is moderate sore throat.Lots of post nasal drainage.  Patient reports coughing frequently as mild amounts of yellow colored/purulent sputum noted. There is fever with chills, no sweats. The patient denies being short of breath. Onset was 3-5 days ago. Gradually worsening in spite of home remedies.   History Shelly Nelson has a past medical history of GERD (gastroesophageal reflux disease) (2012); Hypercholesteremia (2000); Anxiety; Arthritis; Hypertension (2000); Spinal stenosis, lumbar region, without neurogenic claudication (2012); Thyroid disease (2013); Squamous cell carcinoma ([place]); and Basal cell carcinoma.   She has past surgical history that includes Tonsillectomy; Thyroid lobectomy (age 60); Back surgery (11/08/2010); Tubal ligation; Cataract extraction; Cataract extraction w/PHACO (06/09/2012); and skin cancer removal (N/A, 09/10/13).   Her family history includes Anxiety disorder in her mother; Breast cancer in her maternal aunt; Breast cancer (age of onset: 26) in her mother; COPD in her father and paternal grandfather; Cancer in her maternal uncle; Diabetes in her maternal grandmother; Heart disease in her paternal grandmother; Hypertension in her father, maternal grandmother, mother, and paternal grandmother; Stroke in her maternal grandfather.She reports that she has never smoked. She has never used smokeless tobacco. She reports that she does not drink alcohol or use illicit drugs.  Outpatient Prescriptions Prior to Visit  Medication Sig Dispense Refill  . ALPRAZolam (XANAX) 0.25 MG tablet Take 1 tablet (0.25 mg total) by mouth at bedtime as needed for anxiety. 30 tablet 1  . atenolol (TENORMIN) 25 MG tablet Take 1 tablet (25  mg total) by mouth daily. 90 tablet 1  . Calcium Carbonate-Vitamin D (CALCIUM-D) 600-400 MG-UNIT TABS Take 1 tablet by mouth daily.    Marland Kitchen conjugated estrogens (PREMARIN) vaginal cream Place 0.5 Applicatorfuls vaginally 2 (two) times a week. 42.5 g 12  . docusate sodium (DULCOLAX) 100 MG capsule Take 100 mg by mouth daily.    . folic acid (FOLVITE) Q000111Q MCG tablet Take 800 mcg by mouth daily.    . Garlic 123XX123 MG CAPS Take 1 capsule by mouth daily.    . magnesium oxide (MAG-OX) 400 MG tablet Take 400 mg by mouth 2 (two) times daily.    . Multiple Vitamin (MULTIVITAMIN WITH MINERALS) TABS Take 1 tablet by mouth daily.    . Omega-3 Fatty Acids (FISH OIL) 1200 MG CAPS Take 1,200 mg by mouth daily.    Marland Kitchen omeprazole (PRILOSEC) 40 MG capsule Take 1 capsule (40 mg total) by mouth daily. 90 capsule 1  . pravastatin (PRAVACHOL) 40 MG tablet Take 1 tablet (40 mg total) by mouth daily. 90 tablet 1  . psyllium (METAMUCIL) 58.6 % powder Take 1 packet by mouth 2 (two) times daily.     No facility-administered medications prior to visit.    ROS Review of Systems  Constitutional: Negative for fever, chills, activity change and appetite change.  HENT: Positive for congestion, postnasal drip, rhinorrhea and sinus pressure. Negative for ear discharge, ear pain, hearing loss, nosebleeds, sneezing and trouble swallowing.   Respiratory: Negative for chest tightness and shortness of breath.   Cardiovascular: Negative for chest pain and palpitations.  Skin: Negative for rash.    Objective:  BP 164/81 mmHg  Pulse 88  Temp(Src) 100.6 F (38.1 C) (Oral)  Ht 5\' 4"  (1.626 m)  Wt  133 lb (60.328 kg)  BMI 22.82 kg/m2  SpO2 95%  LMP 05/14/2001 (Approximate)  BP Readings from Last 3 Encounters:  04/04/15 164/81  03/09/15 161/82  12/08/14 132/84    Wt Readings from Last 3 Encounters:  04/04/15 133 lb (60.328 kg)  03/09/15 131 lb 9.6 oz (59.693 kg)  12/08/14 132 lb (59.875 kg)     Physical Exam    Constitutional: She appears well-developed and well-nourished.  HENT:  Head: Normocephalic and atraumatic.  Right Ear: Tympanic membrane and external ear normal. No decreased hearing is noted.  Left Ear: Tympanic membrane and external ear normal. No decreased hearing is noted.  Nose: Mucosal edema present. Right sinus exhibits no frontal sinus tenderness. Left sinus exhibits no frontal sinus tenderness.  Mouth/Throat: No oropharyngeal exudate or posterior oropharyngeal erythema.  Neck: No Brudzinski's sign noted.  Pulmonary/Chest: Breath sounds normal. No respiratory distress.  Lymphadenopathy:       Head (right side): No preauricular adenopathy present.       Head (left side): No preauricular adenopathy present.       Right cervical: No superficial cervical adenopathy present.      Left cervical: No superficial cervical adenopathy present.    No results found for: HGBA1C  Lab Results  Component Value Date   WBC 8.5 11/02/2010   HGB 13.7 06/04/2012   HCT 41.4 06/04/2012   PLT 212 11/02/2010   GLUCOSE 93 03/09/2015   CHOL 181 03/09/2015   TRIG 76 03/09/2015   HDL 81 03/09/2015   LDLCALC 85 03/09/2015   ALT 29 03/09/2015   AST 37 03/09/2015   NA 143 03/09/2015   K 4.5 03/09/2015   CL 99 03/09/2015   CREATININE 0.96 03/09/2015   BUN 10 03/09/2015   CO2 28 03/09/2015   TSH 2.820 03/09/2015   INR 0.97 11/02/2010   Results for orders placed or performed in visit on 04/04/15  POCT Influenza A/B  Result Value Ref Range   Influenza A, POC Negative Negative   Influenza B, POC Negative Negative  POCT rapid strep A  Result Value Ref Range   Rapid Strep A Screen Negative Negative    Mm Digital Screening Bilateral  05/20/2014  CLINICAL DATA:  Screening. EXAM: DIGITAL SCREENING BILATERAL MAMMOGRAM WITH CAD COMPARISON:  Previous exam(s). ACR Breast Density Category b: There are scattered areas of fibroglandular density. FINDINGS: There are no findings suspicious for malignancy.  Images were processed with CAD. IMPRESSION: No mammographic evidence of malignancy. A result letter of this screening mammogram will be mailed directly to the patient. RECOMMENDATION: Screening mammogram in one year. (Code:SM-B-01Y) BI-RADS CATEGORY  1: Negative. Electronically Signed   By: Lajean Manes M.D.   On: 05/20/2014 14:22    Assessment & Plan:   Charitee was seen today for uri.  Diagnoses and all orders for this visit:  Acute recurrent maxillary sinusitis -     amoxicillin-clavulanate (AUGMENTIN) 875-125 MG tablet; Take 1 tablet by mouth 2 (two) times daily.  Sore throat -     POCT Influenza A/B -     POCT rapid strep A -     amoxicillin-clavulanate (AUGMENTIN) 875-125 MG tablet; Take 1 tablet by mouth 2 (two) times daily.  Cough -     POCT Influenza A/B -     POCT rapid strep A -     amoxicillin-clavulanate (AUGMENTIN) 875-125 MG tablet; Take 1 tablet by mouth 2 (two) times daily.  Essential hypertension   I am having Ms. Shelly Nelson start on  amoxicillin-clavulanate. I am also having her maintain her Calcium-D, folic acid, Fish Oil, multivitamin with minerals, magnesium oxide, psyllium, docusate sodium, Garlic, conjugated estrogens, atenolol, pravastatin, ALPRAZolam, and omeprazole.  Meds ordered this encounter  Medications  . amoxicillin-clavulanate (AUGMENTIN) 875-125 MG tablet    Sig: Take 1 tablet by mouth 2 (two) times daily.    Dispense:  20 tablet    Refill:  0     Follow-up: Return in about 4 weeks (around 05/02/2015), or with Dr. Sabra Heck, for hypertension.  Claretta Fraise, M.D.

## 2015-04-04 NOTE — Patient Instructions (Signed)
Your blood pressure was moderately elevated today and last time that you were here. You should follow-up within the next 4 weeks for recheck of your blood pressure.  Happy Thanksgiving. [employer]

## 2015-04-04 NOTE — Addendum Note (Signed)
Addended by: Claretta Fraise on: 04/04/2015 11:56 AM   Modules accepted: Orders, Level of Service, SmartSet

## 2015-04-18 ENCOUNTER — Other Ambulatory Visit: Payer: Self-pay

## 2015-04-18 DIAGNOSIS — Z1231 Encounter for screening mammogram for malignant neoplasm of breast: Secondary | ICD-10-CM

## 2015-05-24 ENCOUNTER — Ambulatory Visit
Admission: RE | Admit: 2015-05-24 | Discharge: 2015-05-24 | Disposition: A | Discharge status: Home / Self Care | Payer: Medicare Other | Source: Ambulatory Visit

## 2015-05-24 ENCOUNTER — Ambulatory Visit
Admission: RE | Admit: 2015-05-24 | Discharge: 2015-05-24 | Disposition: A | Discharge status: Home / Self Care | Payer: Medicare Other | Source: Ambulatory Visit | Attending: Nurse Practitioner | Admitting: Nurse Practitioner

## 2015-05-24 DIAGNOSIS — Z1231 Encounter for screening mammogram for malignant neoplasm of breast: Secondary | ICD-10-CM | POA: Diagnosis not present

## 2015-05-24 DIAGNOSIS — M85851 Other specified disorders of bone density and structure, right thigh: Secondary | ICD-10-CM | POA: Diagnosis not present

## 2015-05-24 DIAGNOSIS — M8580 Other specified disorders of bone density and structure, unspecified site: Secondary | ICD-10-CM

## 2015-05-25 ENCOUNTER — Telehealth: Payer: Self-pay | Admitting: *Deleted

## 2015-05-25 NOTE — Telephone Encounter (Signed)
I have attempted to contact this patient by phone with the following results: left message to return call to [place] at [phone] answering machine (home per [facility]).  No personal information given.  [phone] (Home)

## 2015-05-25 NOTE — Telephone Encounter (Signed)
-----   Message from [place], [place] sent at 05/25/2015  8:19 AM EST ----- Please let patient know that BMD done on 05/24/15 shows the T Score at spine at -1.2, right hip neck at -1.6.  The hip markers fall in the Osteopenic range.  There were degenerative changes in the lower spine.  While some bone loss is normal and expectant we do not want to see more.  She actually is doing very well for her young age!  She still needs to maintain calcium and Vit D support. continue to stay active and walking.

## 2015-06-09 NOTE — Telephone Encounter (Signed)
Pt notified in result note.  Closing encounter. 

## 2015-06-12 ENCOUNTER — Other Ambulatory Visit: Payer: Self-pay | Admitting: *Deleted

## 2015-06-12 MED ORDER — ATENOLOL 25 MG PO TABS: 25.0000 mg | ORAL_TABLET | Freq: Every day | ORAL | Status: DC

## 2015-06-12 MED ORDER — OMEPRAZOLE 40 MG PO CPDR: 40.0000 mg | DELAYED_RELEASE_CAPSULE | Freq: Every day | ORAL | Status: DC

## 2015-06-12 MED ORDER — PRAVASTATIN SODIUM 40 MG PO TABS
40.0000 mg | ORAL_TABLET | Freq: Every day | ORAL | Status: DC
Start: 2015-06-12 — End: 2015-08-30

## 2015-08-30 ENCOUNTER — Other Ambulatory Visit: Payer: Self-pay | Admitting: *Deleted

## 2015-08-30 ENCOUNTER — Other Ambulatory Visit: Payer: Self-pay

## 2015-08-30 MED ORDER — OMEPRAZOLE 40 MG PO CPDR: 40.0000 mg | DELAYED_RELEASE_CAPSULE | Freq: Every day | ORAL | Status: DC

## 2015-08-30 MED ORDER — PRAVASTATIN SODIUM 40 MG PO TABS: 40.0000 mg | ORAL_TABLET | Freq: Every day | ORAL | Status: DC

## 2015-08-30 MED ORDER — ATENOLOL 25 MG PO TABS: 25.0000 mg | ORAL_TABLET | Freq: Every day | ORAL | Status: DC

## 2015-08-30 NOTE — Telephone Encounter (Signed)
Last seen 04/04/15 Dr Livia Snellen  This is for mail order 90 days

## 2015-08-30 NOTE — Telephone Encounter (Signed)
Last seen 11/16  Dr Livia Snellen  This is for 90 day mail order

## 2015-09-08 ENCOUNTER — Encounter (INDEPENDENT_AMBULATORY_CARE_PROVIDER_SITE_OTHER): Payer: Self-pay

## 2015-09-08 ENCOUNTER — Ambulatory Visit (INDEPENDENT_AMBULATORY_CARE_PROVIDER_SITE_OTHER): Payer: Medicare Other | Admitting: Family Medicine

## 2015-09-08 ENCOUNTER — Encounter: Payer: Self-pay | Admitting: Family Medicine

## 2015-09-08 ENCOUNTER — Telehealth: Payer: Self-pay | Admitting: Family Medicine

## 2015-09-08 VITALS — BP 142/72 | HR 60 | Temp 97.7°F | Ht 64.0 in | Wt 129.0 lb

## 2015-09-08 DIAGNOSIS — R030 Elevated blood-pressure reading, without diagnosis of hypertension: Secondary | ICD-10-CM | POA: Diagnosis not present

## 2015-09-08 DIAGNOSIS — E785 Hyperlipidemia, unspecified: Secondary | ICD-10-CM | POA: Diagnosis not present

## 2015-09-08 DIAGNOSIS — IMO0001 Reserved for inherently not codable concepts without codable children: Secondary | ICD-10-CM

## 2015-09-08 DIAGNOSIS — K219 Gastro-esophageal reflux disease without esophagitis: Secondary | ICD-10-CM

## 2015-09-08 MED ORDER — ALPRAZOLAM 0.25 MG PO TABS: 0.2500 mg | ORAL_TABLET | Freq: Every evening | ORAL | Status: DC | PRN

## 2015-09-08 NOTE — Progress Notes (Signed)
   Subjective:    Patient ID: Shelly Nelson, female    DOB: 10-02-1929, 82 y.o.   MRN: [id number]  HPI 82 year old female who is here to follow-up hypertension anxiety. She still has some reflux symptoms. When reflux occurs she gets up occasionally takes a Tums. She takes low-dose atenolol blood pressures at home have been excellent. She brings in a recording of her blood pressure monitoring. I have suggested she might try the atenolol at nighttime rather than morning.    Review of Systems  Constitutional: Negative.   HENT: Negative.   Respiratory: Negative.   Cardiovascular: Negative.   Neurological: Negative.   Psychiatric/Behavioral: The patient is nervous/anxious.        Objective:   Physical Exam  Constitutional: She is oriented to person, place, and time. She appears well-developed and well-nourished.  HENT:  Head: Normocephalic.  Eyes: Pupils are equal, round, and reactive to light.  Cardiovascular: Normal rate, regular rhythm and normal heart sounds.   Pulmonary/Chest: Effort normal and breath sounds normal.  Abdominal: Soft.  Neurological: She is alert and oriented to person, place, and time.  Psychiatric: She has a normal mood and affect. Her behavior is normal.    BP 142/72 mmHg  Pulse 60  Temp(Src) 97.7 F (36.5 C) (Oral)  Ht 5\' 4"  (1.626 m)  Wt 129 lb (58.514 kg)  BMI 22.13 kg/m2  LMP 05/14/2001 (Approximate)       Assessment & Plan:  1. White coat hypertension Pressures are well controlled at home. Continue same atenolol but take it at nighttime - ALPRAZolam (XANAX) 0.25 MG tablet; Take 1 tablet (0.25 mg total) by mouth at bedtime as needed for anxiety.  Dispense: 30 tablet; Refill: 1  2. Gastroesophageal reflux disease, esophagitis presence not specified Continue  with omeprazole supplemented with Tums. She tries not to eat or drink after supper. Discussed elevating head of bed if symptoms persist    3. HLD (hyperlipidemia) Lipids were at goal 6  months ago. LDL 85 and HDL 81.  Wardell Honour MD

## 2015-09-08 NOTE — Telephone Encounter (Signed)
Patient aware that I will contact [facility] and have them release the 3 medications needed. Spoke with [facility] and they will fill her pravastatin, atenolol, and omeprazole.

## 2015-10-28 ENCOUNTER — Ambulatory Visit (INDEPENDENT_AMBULATORY_CARE_PROVIDER_SITE_OTHER): Payer: Medicare Other | Admitting: Family Medicine

## 2015-10-28 ENCOUNTER — Ambulatory Visit (INDEPENDENT_AMBULATORY_CARE_PROVIDER_SITE_OTHER): Payer: Medicare Other

## 2015-10-28 ENCOUNTER — Encounter: Payer: Self-pay | Admitting: Family Medicine

## 2015-10-28 VITALS — BP 179/82 | HR 77 | Temp 98.9°F | Ht 64.0 in | Wt 129.0 lb

## 2015-10-28 DIAGNOSIS — T1490XA Injury, unspecified, initial encounter: Secondary | ICD-10-CM

## 2015-10-28 DIAGNOSIS — S0080XA Unspecified superficial injury of other part of head, initial encounter: Secondary | ICD-10-CM

## 2015-10-28 DIAGNOSIS — S80911A Unspecified superficial injury of right knee, initial encounter: Secondary | ICD-10-CM | POA: Diagnosis not present

## 2015-10-28 DIAGNOSIS — W01198A Fall on same level from slipping, tripping and stumbling with subsequent striking against other object, initial encounter: Secondary | ICD-10-CM | POA: Diagnosis not present

## 2015-10-28 DIAGNOSIS — M25562 Pain in left knee: Secondary | ICD-10-CM

## 2015-10-28 DIAGNOSIS — M25561 Pain in right knee: Secondary | ICD-10-CM | POA: Diagnosis not present

## 2015-10-28 MED ORDER — METHYLPREDNISOLONE ACETATE 80 MG/ML IJ SUSP
80.0000 mg | Freq: Once | INTRAMUSCULAR | Status: AC
  Administered 2015-10-28: 80 mg via INTRA_ARTICULAR

## 2015-10-28 NOTE — Progress Notes (Signed)
BP 179/82 mmHg  Pulse 77  Temp(Src) 98.9 F (37.2 C) (Oral)  Ht 5\' 4"  (1.626 m)  Wt 129 lb (58.514 kg)  BMI 22.13 kg/m2  LMP 05/14/2001 (Approximate)   Subjective:    Patient ID: Shelly Nelson, female    DOB: Jul 07, 1929, 82 y.o.   MRN: [id number]  HPI: Shelly Nelson is a 82 y.o. female presenting on 10/28/2015 for Knee Pain and [place] getting out of car in front of building   HPI Knee pain right Patient has developed some right knee pain over the past 2 days that also has caused some swelling and effusion. She feels like it gives way on her sometimes. She cannot recall any specific injury that brought this on. She denies any fevers or chills or redness or warmth. The pain is worse on the medial aspect of the knee than the lateral.  Fall and injury As the patient was coming into our office. She slipped and fell on the curb and landed on her front entrance mat. She landed on both of her front knees and scraped both of him and also then fell forward and hit her right cheek on the mat as well. She feels like both knees are skinned but still most of her pain is in that right knee that she was having before and she doesn't really notice that much more pain in her cheek or either knee.  Relevant past medical, surgical, family and social history reviewed and updated as indicated. Interim medical history since our last visit reviewed. Allergies and medications reviewed and updated.  Review of Systems  Constitutional: Negative for fever and chills.  HENT: Negative for congestion, ear discharge and ear pain.   Eyes: Negative for redness and visual disturbance.  Respiratory: Negative for chest tightness and shortness of breath.   Cardiovascular: Negative for chest pain and leg swelling.  Genitourinary: Negative for dysuria and difficulty urinating.  Musculoskeletal: Positive for joint swelling, arthralgias and gait problem. Negative for back pain.  Skin: Positive for wound (Small scrapes  on both anterior knees). Negative for rash.  Neurological: Negative for light-headedness and headaches.  Psychiatric/Behavioral: Negative for behavioral problems and agitation.  All other systems reviewed and are negative.   Per HPI unless specifically indicated above     Medication List       This list is accurate as of: 10/28/15 10:28 AM.  Always use your most recent med list.               ALPRAZolam 0.25 MG tablet  Commonly known as:  XANAX  Take 1 tablet (0.25 mg total) by mouth at bedtime as needed for anxiety.     atenolol 25 MG tablet  Commonly known as:  TENORMIN  Take 1 tablet (25 mg total) by mouth daily.     Calcium-D 600-400 MG-UNIT Tabs  Take 1 tablet by mouth daily.     conjugated estrogens vaginal cream  Commonly known as:  PREMARIN  Place 0.5 Applicatorfuls vaginally 2 (two) times a week.     DULCOLAX 100 MG capsule  Generic drug:  docusate sodium  Take 100 mg by mouth daily.     Fish Oil 1200 MG Caps  Take 1,200 mg by mouth daily.     folic acid Q000111Q MCG tablet  Commonly known as:  FOLVITE  Take 800 mcg by mouth daily.     Garlic 123XX123 MG Caps  Take 1 capsule by mouth daily.     magnesium  oxide 400 MG tablet  Commonly known as:  MAG-OX  Take 400 mg by mouth 2 (two) times daily.     multivitamin with minerals Tabs tablet  Take 1 tablet by mouth daily.     omeprazole 40 MG capsule  Commonly known as:  PRILOSEC  Take 1 capsule (40 mg total) by mouth daily.     pravastatin 40 MG tablet  Commonly known as:  PRAVACHOL  Take 1 tablet (40 mg total) by mouth daily.     psyllium 58.6 % powder  Commonly known as:  METAMUCIL  Take 1 packet by mouth 2 (two) times daily.           Objective:    BP 179/82 mmHg  Pulse 77  Temp(Src) 98.9 F (37.2 C) (Oral)  Ht 5\' 4"  (1.626 m)  Wt 129 lb (58.514 kg)  BMI 22.13 kg/m2  LMP 05/14/2001 (Approximate)  Wt Readings from Last 3 Encounters:  10/28/15 129 lb (58.514 kg)  09/08/15 129 lb (58.514  kg)  04/04/15 133 lb (60.328 kg)    Physical Exam  Constitutional: She is oriented to person, place, and time. She appears well-developed and well-nourished. No distress.  Eyes: Conjunctivae and EOM are normal. Pupils are equal, round, and reactive to light.  Neck: Neck supple. No thyromegaly present.  Cardiovascular: Normal rate, regular rhythm, normal heart sounds and intact distal pulses.   No murmur heard. Pulmonary/Chest: Effort normal and breath sounds normal. No respiratory distress. She has no wheezes.  Musculoskeletal: Normal range of motion. She exhibits tenderness. She exhibits no edema.       Right knee: She exhibits effusion. She exhibits normal range of motion (But does have significant pain with full extension and flexion), no ecchymosis, no deformity, no erythema, normal alignment, no LCL laxity, normal meniscus and no MCL laxity. Tenderness found. Medial joint line and lateral joint line tenderness noted.  Lymphadenopathy:    She has no cervical adenopathy.  Neurological: She is alert and oriented to person, place, and time. Coordination normal.  Skin: Skin is warm and dry. Abrasion (Small abrasions on both anterior knees) noted. No rash noted. She is not diaphoretic.  Psychiatric: She has a normal mood and affect. Her behavior is normal.  Nursing note and vitals reviewed.   X-ray of bilateral knees: Knee x-ray bilaterally shows medial compartment narrowing more than lateral and some bone spurs but no acute fractures.  X-ray face: No acute fractures are visualized.  Knee injection: Risk factors of bleeding and infection discussed with patient and patient is agreeable towards injection. Patient prepped with Betadine. Lateral approach towards injection used. Injected 80mg  of Depo-Medrol and 1 mL of 2% lidocaine. Patient tolerated procedure well and no side effects from noted. Minimal to no bleeding. Simple bandage applied after.     Assessment & Plan:   Problem List Items  Addressed This Visit    None    Visit Diagnoses    Knee pain, acute, right    -  Primary    Relevant Medications    methylPREDNISolone acetate (DEPO-MEDROL) injection 80 mg (Completed)    Other Relevant Orders    DG Knee 1-2 Views Right (Completed)    Injury        Relevant Orders    DG Knee 1-2 Views Left (Completed)    DG Facial Bones 1-2 Views (Completed)       Follow up plan: Return if symptoms worsen or fail to improve.  Counseling provided for all of the  vaccine components Orders Placed This Encounter  Procedures  . DG Knee 1-2 Views Left  . DG Knee 1-2 Views Right  . DG Facial Bones 1-2 Views    Caryl Pina, MD  [facility] 10/28/2015, 10:28 AM

## 2015-12-14 ENCOUNTER — Ambulatory Visit (INDEPENDENT_AMBULATORY_CARE_PROVIDER_SITE_OTHER): Payer: Medicare Other | Admitting: Nurse Practitioner

## 2015-12-14 ENCOUNTER — Encounter: Payer: Self-pay | Admitting: Nurse Practitioner

## 2015-12-14 VITALS — BP 126/82 | HR 72 | Ht 63.75 in | Wt 128.0 lb

## 2015-12-14 DIAGNOSIS — K6289 Other specified diseases of anus and rectum: Secondary | ICD-10-CM

## 2015-12-14 DIAGNOSIS — N952 Postmenopausal atrophic vaginitis: Secondary | ICD-10-CM

## 2015-12-14 DIAGNOSIS — R198 Other specified symptoms and signs involving the digestive system and abdomen: Secondary | ICD-10-CM | POA: Diagnosis not present

## 2015-12-14 DIAGNOSIS — Z01419 Encounter for gynecological examination (general) (routine) without abnormal findings: Secondary | ICD-10-CM

## 2015-12-14 DIAGNOSIS — E559 Vitamin D deficiency, unspecified: Secondary | ICD-10-CM

## 2015-12-14 MED ORDER — ESTROGENS, CONJUGATED 0.625 MG/GM VA CREA: 1.0000 g | TOPICAL_CREAM | VAGINAL | 12 refills | Status: DC

## 2015-12-14 NOTE — Progress Notes (Signed)
Patient ID: Shelly Nelson, female   DOB: [birth year], 82 y.o.   MRN: [id number]  82 y.o. [id number] Married  Caucasian Fe here for annual exam.  No new health problems other than right knee pain secondary to arthritis.   She had a recent steroid injection.  She continues to use premarin vaginal cream to the urethra twice a week.  Some urinary stress incontinence and occasional stool incontinence.  She does take a stool softener daily.  Patient's last menstrual period was 05/14/2001 (approximate).          Sexually active: No.  The current method of family planning is none.    Exercising: Yes.    walking Smoker:  no  Health Maintenance: Pap: 08/2010, Normal per patient MMG: 05/24/15, Bi-Rads 1: negative Colonoscopy: 02/2012 normal, previous polyp history  BMD: 05/24/15 T-Score: -1.2 Spine / -1.6 Right Femur Neck TDaP: 09/10/12 Shingles: 02/12/2008 Pneumovax: 05/14/05, Prevnar 13 02/18/14 Hep C and HIV: Not indicated due to age Labs: PCP takes care of labs, Vit D - we check   reports that she has never smoked. She has never used smokeless tobacco. She reports that she does not drink alcohol or use drugs.  Past Medical History:  Diagnosis Date  . Anxiety   . Arthritis   . Basal cell carcinoma   . GERD (gastroesophageal reflux disease) 2012  . Hypercholesteremia 2000  . Hypertension 2000  . Spinal stenosis, lumbar region, without neurogenic claudication 2012  . Squamous cell carcinoma ([place])   . Thyroid disease 2013   thyroid nodule with needle aspiration    Past Surgical History:  Procedure Laterality Date  . BACK SURGERY  11/08/2010   lumbar-bulging disc  . CATARACT EXTRACTION     left eye-2011-Dr Hunt  . CATARACT EXTRACTION W/PHACO  06/09/2012   Procedure: CATARACT EXTRACTION PHACO AND INTRAOCULAR LENS PLACEMENT (IOC);  Surgeon: Tonny Branch, MD;  Location: [facility];  Service: Ophthalmology;  Laterality: Left;  CDE=13.91  . skin cancer removal N/A 09/10/13  . THYROID LOBECTOMY  age 86  .  TONSILLECTOMY     age 34  . TUBAL LIGATION      Current Outpatient Prescriptions  Medication Sig Dispense Refill  . ALPRAZolam (XANAX) 0.25 MG tablet Take 1 tablet (0.25 mg total) by mouth at bedtime as needed for anxiety. 30 tablet 1  . atenolol (TENORMIN) 25 MG tablet Take 1 tablet (25 mg total) by mouth daily. 90 tablet 0  . Calcium Carbonate-Vitamin D (CALCIUM-D) 600-400 MG-UNIT TABS Take 1 tablet by mouth daily.    Marland Kitchen conjugated estrogens (PREMARIN) vaginal cream Place 0.5 Applicatorfuls vaginally 2 (two) times a week. 42.5 g 12  . docusate sodium (DULCOLAX) 100 MG capsule Take 100 mg by mouth daily.    . folic acid (FOLVITE) Q000111Q MCG tablet Take 800 mcg by mouth daily.    . Garlic 123XX123 MG CAPS Take 1 capsule by mouth daily.    . magnesium oxide (MAG-OX) 400 MG tablet Take 400 mg by mouth 2 (two) times daily.    . Multiple Vitamin (MULTIVITAMIN WITH MINERALS) TABS Take 1 tablet by mouth daily.    . Omega-3 Fatty Acids (FISH OIL) 1200 MG CAPS Take 1,200 mg by mouth daily.    Marland Kitchen omeprazole (PRILOSEC) 40 MG capsule Take 1 capsule (40 mg total) by mouth daily. 90 capsule 0  . pravastatin (PRAVACHOL) 40 MG tablet Take 1 tablet (40 mg total) by mouth daily. 90 tablet 0  . psyllium (METAMUCIL) 58.6 % powder  Take 1 packet by mouth 2 (two) times daily.     No current facility-administered medications for this visit.     Family History  Problem Relation Age of Onset  . Hypertension Mother   . Anxiety disorder Mother   . Breast cancer Mother 110    metasis to lung and bones  . Hypertension Father   . COPD Father   . Hypertension Maternal Grandmother   . Hypertension Paternal Grandmother   . Heart disease Paternal Grandmother   . Breast cancer Maternal Aunt   . Cancer Maternal Uncle     X 5 with various cancers  . Stroke Maternal Grandfather   . COPD Paternal Grandfather   . Diabetes Maternal Grandmother     type 2    ROS:  Pertinent items are noted in HPI.  Otherwise, a comprehensive  ROS was negative.  Exam:   LMP 05/14/2001 (Approximate) Comment: hormone replacement   Ht Readings from Last 3 Encounters:  10/28/15 5\' 4"  (1.626 m)  09/08/15 5\' 4"  (1.626 m)  04/04/15 5\' 4"  (1.626 m)    General appearance: alert, cooperative and appears stated age Head: Normocephalic, without obvious abnormality, atraumatic Neck: no adenopathy, supple, symmetrical, trachea midline and thyroid normal to inspection and palpation Lungs: clear to auscultation bilaterally Breasts: normal appearance, no masses or tenderness Heart: regular rate and rhythm Abdomen: soft, non-tender; no masses,  no organomegaly Extremities: extremities normal, atraumatic, no cyanosis or edema Skin: Skin color, texture, turgor normal. No rashes or lesions Lymph nodes: Cervical, supraclavicular, and axillary nodes normal. No abnormal inguinal nodes palpated Neurologic: Grossly normal   Pelvic: External genitalia:  no lesions              Urethra:  normal appearing urethra with no masses, tenderness or lesions              Bartholin's and Skene's: normal                 Vagina: normal appearing vagina with normal color and discharge, no lesions              Cervix: anteverted              Pap taken: No. Bimanual Exam:  Uterus:  normal size, contour, position, consistency, mobility, non-tender              Adnexa: no mass, fullness, tenderness               Rectovaginal: Confirms               Anus:  normal sphincter tone, with a firm nodule in the rectum- R/O mass:  Anal scope:  Verbal consent for procedure.   Pt is placed in left decubitus with knee chest position.  The anal scope is slowly inserted and there is presence of an internal hemorrhoid about 3/4 cm in length about 2 cm inside the rectum.  No bleeding or thrombosis of area.  Inspected further with use of a Q tip without bleeding. Anal scope was then removed and another digital exam with reentry to look at the area and again this was at the site of  internal hemorrhoid.  Chaperone present: yes  A:  Well Woman with normal exam  Postmenopausal Atrophic vaginitis doing better on Premarin Vaginal cream Mild stable SUI followed by Dr. Matilde Sprang History of anxiety, GERD, Hypercortisolemia  Internal rectal hemorrhoid  History of Vit D deficiency - now on OTC   P:  Reviewed health and wellness pertinent to exam  Pap smear as above  Mammogram is due 05/2016  She will continue with stool softeners.she is informed of results of anal exam and findings on anal scope.  She will continue to monitor any signs or changes with stool.  Refill on vaginal estrogen  Cautioned about risk of DVT,CVA, cancer, etc  Follow up on Vit D  Counseled on breast self exam, mammography screening, use and side effects of HRT, adequate intake of calcium and vitamin D, diet and exercise, Kegel's exercises return annually or prn  An After Visit Summary was printed and given to the patient.

## 2015-12-14 NOTE — Patient Instructions (Signed)

## 2015-12-15 ENCOUNTER — Other Ambulatory Visit: Payer: Self-pay | Admitting: Family Medicine

## 2015-12-15 ENCOUNTER — Telehealth: Payer: Self-pay | Admitting: Family Medicine

## 2015-12-15 LAB — VITAMIN D 25 HYDROXY (VIT D DEFICIENCY, FRACTURES): Vit D, 25-Hydroxy: 45 ng/mL (ref 30–100)

## 2015-12-15 MED ORDER — ATENOLOL 25 MG PO TABS: 25.0000 mg | ORAL_TABLET | Freq: Every day | ORAL | 0 refills | Status: DC

## 2015-12-15 NOTE — Telephone Encounter (Signed)
Spoke with Cyril Mourning from [facility] and states patient is out of her Atenolol 25mg  but it is on back order. Pharmacy would like to know what she can be switched to instead. Please advise and send back to the pools.

## 2015-12-15 NOTE — Progress Notes (Signed)
Encounter reviewed Jill Jertson, MD   

## 2015-12-15 NOTE — Telephone Encounter (Signed)
done

## 2015-12-15 NOTE — Telephone Encounter (Signed)
Since atenolol was not available I will go with low-dose carvedilol 3.125 mg twice a day #60

## 2015-12-20 ENCOUNTER — Telehealth: Payer: Self-pay | Admitting: *Deleted

## 2015-12-20 NOTE — Telephone Encounter (Signed)
I recall substituting carvedilol 3.25 mg twice a day for her last week not sure where that stands but in view of the fact that she cannot get her atenolol that's what I would use.

## 2015-12-21 MED ORDER — CARVEDILOL 3.125 MG PO TABS: 3.1250 mg | ORAL_TABLET | Freq: Two times a day (BID) | ORAL | 0 refills | Status: DC

## 2016-01-24 ENCOUNTER — Telehealth: Payer: Self-pay | Admitting: Nurse Practitioner

## 2016-01-24 NOTE — Telephone Encounter (Signed)
Patient calling for vitamin d results. °

## 2016-01-24 NOTE — Telephone Encounter (Signed)
Return call to patient. Advised patty had sent message to her through My Chart with result. Patient states she has not actually used My Chart, she uses it for her daughter to have access to her records.  My Chart message from Patty read to patient. See Result note from 12-16-15.  Routing to provider for final review. Patient agreeable to disposition. Will close encounter.

## 2016-02-02 ENCOUNTER — Ambulatory Visit (INDEPENDENT_AMBULATORY_CARE_PROVIDER_SITE_OTHER): Payer: Medicare Other

## 2016-02-02 DIAGNOSIS — Z23 Encounter for immunization: Secondary | ICD-10-CM | POA: Diagnosis not present

## 2016-02-06 DIAGNOSIS — H524 Presbyopia: Secondary | ICD-10-CM | POA: Diagnosis not present

## 2016-02-06 DIAGNOSIS — H5211 Myopia, right eye: Secondary | ICD-10-CM | POA: Diagnosis not present

## 2016-02-06 DIAGNOSIS — H43813 Vitreous degeneration, bilateral: Secondary | ICD-10-CM | POA: Diagnosis not present

## 2016-02-06 DIAGNOSIS — H52223 Regular astigmatism, bilateral: Secondary | ICD-10-CM | POA: Diagnosis not present

## 2016-02-10 DIAGNOSIS — E042 Nontoxic multinodular goiter: Secondary | ICD-10-CM | POA: Diagnosis not present

## 2016-02-10 DIAGNOSIS — Z9181 History of falling: Secondary | ICD-10-CM | POA: Diagnosis not present

## 2016-03-12 NOTE — Progress Notes (Signed)
   Subjective:    Patient ID: Shelly Nelson, female    DOB: 05-18-1929, 82 y.o.   MRN: [id number]  HPI six-month follow-up for blood pressure. Patient has been monitoring pressure at home and numbers are good. It is however elevated today but she also has some back pain pain feels muscular does not radiate is in the right side. There is been no history of injury or trauma.  Patient Active Problem List   Diagnosis Date Noted  . HTN (hypertension) 09/09/2012  . HLD (hyperlipidemia) 09/09/2012  . GERD (gastroesophageal reflux disease) 09/09/2012  . Postmenopausal 09/09/2012  . Multinodular goiter 09/09/2012  . White coat hypertension 09/09/2012  . Perimenopausal atrophic vaginitis 09/09/2012  . Diverticulosis of colon without hemorrhage 09/09/2012   Outpatient Encounter Prescriptions as of 03/13/2016  Medication Sig  . ALPRAZolam (XANAX) 0.25 MG tablet Take 1 tablet (0.25 mg total) by mouth at bedtime as needed for anxiety.  Marland Kitchen atenolol (TENORMIN) 25 MG tablet Take 1 tablet (25 mg total) by mouth daily.  . Calcium Carbonate-Vitamin D (CALCIUM-D) 600-400 MG-UNIT TABS Take 1 tablet by mouth daily.  . carvedilol (COREG) 3.125 MG tablet Take 1 tablet (3.125 mg total) by mouth 2 (two) times daily with a meal.  . conjugated estrogens (PREMARIN) vaginal cream Place 0.5 Applicatorfuls vaginally 2 (two) times a week.  . docusate sodium (DULCOLAX) 100 MG capsule Take 100 mg by mouth daily.  . folic acid (FOLVITE) 242 MCG tablet Take 800 mcg by mouth daily.  . Garlic 3536 MG CAPS Take 1 capsule by mouth daily.  . magnesium oxide (MAG-OX) 400 MG tablet Take 400 mg by mouth 2 (two) times daily.  . Multiple Vitamin (MULTIVITAMIN WITH MINERALS) TABS Take 1 tablet by mouth daily.  . Omega-3 Fatty Acids (FISH OIL) 1200 MG CAPS Take 1,200 mg by mouth daily.  Marland Kitchen omeprazole (PRILOSEC) 40 MG capsule Take 1 capsule (40 mg total) by mouth daily.  . pravastatin (PRAVACHOL) 40 MG tablet Take 1 tablet (40 mg  total) by mouth daily.  . psyllium (METAMUCIL) 58.6 % powder Take 1 packet by mouth 2 (two) times daily.   No facility-administered encounter medications on file as of 03/13/2016.       Review of Systems  Constitutional: Negative.   Respiratory: Negative.   Musculoskeletal: Positive for back pain.  Neurological: Positive for numbness.       Objective:   Physical Exam  Constitutional: She is oriented to person, place, and time. She appears well-developed and well-nourished.  Cardiovascular: Normal rate and regular rhythm.   Pulmonary/Chest: Effort normal and breath sounds normal.  Musculoskeletal:  Back: There is normal range of motion but there is some pain with bending to the right. Straight leg raising is negative. Deep tendon reflexes are symmetric.  Neurological: She is oriented to person, place, and time.   BP (!) 168/83 (BP Location: Left Arm, Patient Position: Sitting, Cuff Size: Normal)   Pulse 75   Temp 97.1 F (36.2 C) (Oral)   Ht 5' 3.75" (1.619 m)   Wt 132 lb (59.9 kg)   LMP 05/14/2001 (Approximate) Comment: hormone replacement  BMI 22.84 kg/m         Assessment & Plan:  1. Essential hypertension Patient still takes atenolol 25 mg. She divides the peel and have takes half morning and have it night to get better coverage. Will reassess her lipids and sugar today - CMP14+EGFR - Lipid panel  Wardell Honour MD

## 2016-03-13 ENCOUNTER — Ambulatory Visit (INDEPENDENT_AMBULATORY_CARE_PROVIDER_SITE_OTHER): Payer: Medicare Other | Admitting: Family Medicine

## 2016-03-13 ENCOUNTER — Encounter: Payer: Self-pay | Admitting: Family Medicine

## 2016-03-13 VITALS — BP 168/83 | HR 75 | Temp 97.1°F | Ht 63.75 in | Wt 132.0 lb

## 2016-03-13 DIAGNOSIS — I10 Essential (primary) hypertension: Secondary | ICD-10-CM

## 2016-03-14 ENCOUNTER — Telehealth: Payer: Self-pay | Admitting: Family Medicine

## 2016-03-14 LAB — CMP14+EGFR
ALK PHOS: 57 IU/L (ref 39–117)
ALT: 21 IU/L (ref 0–32)
AST: 36 IU/L (ref 0–40)
Albumin/Globulin Ratio: 1.7 (ref 1.2–2.2)
Albumin: 4.2 g/dL (ref 3.5–4.7)
BILIRUBIN TOTAL: 0.4 mg/dL (ref 0.0–1.2)
BUN/Creatinine Ratio: 10 — ABNORMAL LOW (ref 12–28)
BUN: 9 mg/dL (ref 8–27)
CO2: 28 mmol/L (ref 18–29)
Calcium: 9.1 mg/dL (ref 8.7–10.3)
Chloride: 101 mmol/L (ref 96–106)
Creatinine, Ser: 0.92 mg/dL (ref 0.57–1.00)
GFR calc non Af Amer: 58 mL/min/{1.73_m2} — ABNORMAL LOW (ref >59)
GFR, EST AFRICAN AMERICAN: 67 mL/min/{1.73_m2} (ref >59)
GLUCOSE: 104 mg/dL — AB (ref 65–99)
Globulin, Total: 2.5 g/dL (ref 1.5–4.5)
Potassium: 4.2 mmol/L (ref 3.5–5.2)
Sodium: 143 mmol/L (ref 134–144)
TOTAL PROTEIN: 6.7 g/dL (ref 6.0–8.5)

## 2016-03-14 LAB — LIPID PANEL
CHOLESTEROL TOTAL: 183 mg/dL (ref 100–199)
Chol/HDL Ratio: 2.3 ratio units (ref 0.0–4.4)
HDL: 80 mg/dL (ref >39)
LDL Calculated: 88 mg/dL (ref 0–99)
Triglycerides: 75 mg/dL (ref 0–149)
VLDL CHOLESTEROL CAL: 15 mg/dL (ref 5–40)

## 2016-03-14 MED ORDER — PRAVASTATIN SODIUM 40 MG PO TABS: 40.0000 mg | ORAL_TABLET | Freq: Every day | ORAL | 1 refills | Status: DC

## 2016-03-14 MED ORDER — OMEPRAZOLE 40 MG PO CPDR: 40.0000 mg | DELAYED_RELEASE_CAPSULE | Freq: Every day | ORAL | 1 refills | Status: DC

## 2016-03-14 MED ORDER — ATENOLOL 25 MG PO TABS: 25.0000 mg | ORAL_TABLET | Freq: Every day | ORAL | 1 refills | Status: DC

## 2016-03-14 NOTE — Telephone Encounter (Signed)
Please advise on refills for patient.

## 2016-03-14 NOTE — Telephone Encounter (Signed)
Patient wants her maintenance medicines atenolol, pravastatin, and omeprazole called in [facility]. Directions quantities will not change.

## 2016-03-14 NOTE — Telephone Encounter (Signed)
Left message, three scripts sent to [facility].

## 2016-03-21 ENCOUNTER — Telehealth: Payer: Self-pay | Admitting: Family Medicine

## 2016-03-21 NOTE — Telephone Encounter (Signed)
Put copy of labs in the mail.

## 2016-04-17 ENCOUNTER — Other Ambulatory Visit: Payer: Self-pay | Admitting: Family Medicine

## 2016-04-17 DIAGNOSIS — Z1231 Encounter for screening mammogram for malignant neoplasm of breast: Secondary | ICD-10-CM

## 2016-05-08 ENCOUNTER — Other Ambulatory Visit: Payer: Self-pay | Admitting: *Deleted

## 2016-06-05 ENCOUNTER — Ambulatory Visit: Payer: Medicare Other

## 2016-06-06 ENCOUNTER — Ambulatory Visit: Payer: Medicare Other

## 2016-06-09 ENCOUNTER — Other Ambulatory Visit: Payer: Self-pay | Admitting: Family Medicine

## 2016-06-27 ENCOUNTER — Encounter: Payer: Self-pay | Admitting: *Deleted

## 2016-07-11 ENCOUNTER — Ambulatory Visit
Admission: RE | Admit: 2016-07-11 | Discharge: 2016-07-11 | Disposition: A | Discharge status: Home / Self Care | Payer: Medicare Other | Source: Ambulatory Visit | Attending: Family Medicine | Admitting: Family Medicine

## 2016-07-11 DIAGNOSIS — Z1231 Encounter for screening mammogram for malignant neoplasm of breast: Secondary | ICD-10-CM | POA: Diagnosis not present

## 2016-09-11 ENCOUNTER — Ambulatory Visit (INDEPENDENT_AMBULATORY_CARE_PROVIDER_SITE_OTHER): Payer: Medicare Other | Admitting: Family Medicine

## 2016-09-11 ENCOUNTER — Ambulatory Visit: Payer: Medicare Other | Admitting: Family Medicine

## 2016-09-11 ENCOUNTER — Encounter: Payer: Self-pay | Admitting: Family Medicine

## 2016-09-11 VITALS — BP 166/74 | HR 76 | Temp 97.1°F | Ht 63.75 in | Wt 131.0 lb

## 2016-09-11 DIAGNOSIS — F419 Anxiety disorder, unspecified: Secondary | ICD-10-CM | POA: Diagnosis not present

## 2016-09-11 DIAGNOSIS — I10 Essential (primary) hypertension: Secondary | ICD-10-CM

## 2016-09-11 DIAGNOSIS — R739 Hyperglycemia, unspecified: Secondary | ICD-10-CM | POA: Diagnosis not present

## 2016-09-11 DIAGNOSIS — E7800 Pure hypercholesterolemia, unspecified: Secondary | ICD-10-CM

## 2016-09-11 DIAGNOSIS — K219 Gastro-esophageal reflux disease without esophagitis: Secondary | ICD-10-CM | POA: Diagnosis not present

## 2016-09-11 LAB — BAYER DCA HB A1C WAIVED: HB A1C (BAYER DCA - WAIVED): 5.9 %

## 2016-09-11 MED ORDER — LISINOPRIL 5 MG PO TABS: 5.0000 mg | ORAL_TABLET | Freq: Every day | ORAL | 3 refills | Status: DC

## 2016-09-11 MED ORDER — ESCITALOPRAM OXALATE 10 MG PO TABS: 10.0000 mg | ORAL_TABLET | Freq: Every day | ORAL | 2 refills | Status: DC

## 2016-09-11 NOTE — Progress Notes (Signed)
BP (!) 166/74   Pulse 76   Temp 97.1 F (36.2 C) (Oral)   Ht 5' 3.75" (1.619 m)   Wt 131 lb (59.4 kg)   LMP 05/14/2001 (Approximate) Comment: hormone replacement  BMI 22.66 kg/m    Subjective:    Patient ID: Shelly Nelson, female    DOB: 05/13/1930, 83 y.o.   MRN: [id number]  HPI: Shelly Nelson is a 83 y.o. female presenting on 09/11/2016 for Hyperlipidemia (6 mo (was Dr Sabra Heck pt)) and Hypertension   HPI Hypertension  patient is coming in for blood pressure recheck. Blood pressure is 166/74. Patient is currently on atenolol, and the blood pressure has been fluctuating. Patient denies headaches, blurred vision, chest pains, shortness of breath, or weakness. Denies any side effects from medication and is content with current medication.   Hyperlipidemia Patient is coming in for recheck of his hyperlipidemia. He is currently taking pravastatin and omega-3. He denies any issues with myalgias or history of liver damage from it. He denies any focal numbness or weakness or chest pain.    GERD Patient is on omeprazole and says it is working very well for her. She denies any blood in her stool. She denies any abdominal pain or heartburn or acid.  Elevated blood sugar Patient had an elevated blood sugar on the last straw and wants to get an A1c checked today. She denies any urinary frequency or dysuria or polydipsia.  Anxiety Patient has been having a lot more issues with anxiety and feels like it is building up to the point where she is not able to function as much because she gets so stressed and anxious and jittery and shaky. Sometimes it does keep her up at night but not all of the time. She cannot recall any specific triggers and her recent life. She has had some failing numerous past in the past year. She would like to try something for anxiety. She denies any suicidal ideations or thoughts of hurting herself.  Relevant past medical, surgical, family and social history reviewed  and updated as indicated. Interim medical history since our last visit reviewed. Allergies and medications reviewed and updated.  Review of Systems  Constitutional: Negative for chills and fever.  HENT: Negative for congestion, ear discharge and ear pain.   Eyes: Negative for redness and visual disturbance.  Respiratory: Negative for chest tightness and shortness of breath.   Cardiovascular: Negative for chest pain and leg swelling.  Gastrointestinal: Negative for abdominal pain.  Genitourinary: Negative for difficulty urinating, dysuria and frequency.  Musculoskeletal: Negative for back pain and gait problem.  Skin: Negative for color change and rash.  Neurological: Negative for dizziness, light-headedness and headaches.  Psychiatric/Behavioral: Positive for dysphoric mood and sleep disturbance. Negative for agitation, behavioral problems, decreased concentration, self-injury and suicidal ideas. The patient is nervous/anxious.   All other systems reviewed and are negative.   Per HPI unless specifically indicated above     Objective:    BP (!) 166/74   Pulse 76   Temp 97.1 F (36.2 C) (Oral)   Ht 5' 3.75" (1.619 m)   Wt 131 lb (59.4 kg)   LMP 05/14/2001 (Approximate) Comment: hormone replacement  BMI 22.66 kg/m   Wt Readings from Last 3 Encounters:  09/11/16 131 lb (59.4 kg)  03/13/16 132 lb (59.9 kg)  12/14/15 128 lb (58.1 kg)    Physical Exam  Constitutional: She is oriented to person, place, and time. She appears well-developed and well-nourished. No distress.  Eyes: Conjunctivae are normal.  Neck: Neck supple. No thyromegaly present.  Cardiovascular: Normal rate, regular rhythm, normal heart sounds and intact distal pulses.   No murmur heard. Pulmonary/Chest: Effort normal and breath sounds normal. No respiratory distress. She has no wheezes.  Abdominal: Soft. Bowel sounds are normal. She exhibits no distension. There is no tenderness.  Musculoskeletal: Normal range  of motion. She exhibits no edema or tenderness.  Lymphadenopathy:    She has no cervical adenopathy.  Neurological: She is alert and oriented to person, place, and time. Coordination normal.  Skin: Skin is warm and dry. No rash noted. She is not diaphoretic.  Psychiatric: Her behavior is normal. Judgment normal. Her mood appears anxious. She exhibits a depressed mood. She expresses no suicidal ideation. She expresses no suicidal plans.  Nursing note and vitals reviewed.     Assessment & Plan:   Problem List Items Addressed This Visit      Cardiovascular and Mediastinum   HTN (hypertension) - Primary   Relevant Medications   lisinopril (PRINIVIL,ZESTRIL) 5 MG tablet   Other Relevant Orders   CMP14+EGFR (Completed)     Digestive   GERD (gastroesophageal reflux disease)     Other   HLD (hyperlipidemia)   Relevant Medications   lisinopril (PRINIVIL,ZESTRIL) 5 MG tablet   Other Relevant Orders   Lipid panel (Completed)    Other Visit Diagnoses    Elevated blood sugar       Relevant Orders   Bayer DCA Hb A1c Waived (Completed)   CMP14+EGFR (Completed)   Anxiety       Relevant Medications   escitalopram (LEXAPRO) 10 MG tablet       Follow up plan: Return in about 4 weeks (around 10/09/2016), or if symptoms worsen or fail to improve, for Anxiety and hypertension recheck.  Counseling provided for all of the vaccine components Orders Placed This Encounter  Procedures  . Bayer DCA Hb A1c Waived  . CMP14+EGFR  . Lipid panel    Caryl Pina, MD [facility] 09/11/2016, 11:53 AM

## 2016-09-12 LAB — CMP14+EGFR
A/G RATIO: 1.8 (ref 1.2–2.2)
ALBUMIN: 4.3 g/dL (ref 3.5–4.7)
ALK PHOS: 57 IU/L (ref 39–117)
ALT: 25 IU/L (ref 0–32)
AST: 40 IU/L (ref 0–40)
BILIRUBIN TOTAL: 0.4 mg/dL (ref 0.0–1.2)
BUN / CREAT RATIO: 11 — AB (ref 12–28)
BUN: 11 mg/dL (ref 8–27)
CHLORIDE: 101 mmol/L (ref 96–106)
CO2: 25 mmol/L (ref 18–29)
Calcium: 9.4 mg/dL (ref 8.7–10.3)
Creatinine, Ser: 1 mg/dL (ref 0.57–1.00)
GFR calc non Af Amer: 52 mL/min/{1.73_m2} — ABNORMAL LOW (ref >59)
GFR, EST AFRICAN AMERICAN: 60 mL/min/{1.73_m2} (ref >59)
GLOBULIN, TOTAL: 2.4 g/dL (ref 1.5–4.5)
Glucose: 99 mg/dL (ref 65–99)
POTASSIUM: 4.5 mmol/L (ref 3.5–5.2)
SODIUM: 142 mmol/L (ref 134–144)
TOTAL PROTEIN: 6.7 g/dL (ref 6.0–8.5)

## 2016-09-12 LAB — LIPID PANEL
Chol/HDL Ratio: 2.2 ratio (ref 0.0–4.4)
Cholesterol, Total: 184 mg/dL (ref 100–199)
HDL: 84 mg/dL (ref >39)
LDL Calculated: 85 mg/dL (ref 0–99)
Triglycerides: 76 mg/dL (ref 0–149)
VLDL CHOLESTEROL CAL: 15 mg/dL (ref 5–40)

## 2016-09-14 ENCOUNTER — Telehealth: Payer: Self-pay | Admitting: Family Medicine

## 2016-09-14 NOTE — Telephone Encounter (Signed)
lmtcb

## 2016-09-14 NOTE — Telephone Encounter (Signed)
Patient states she was taken off of Atenolol 25mg  5/1 and put on lisinopril 5mg  and lexapro 10mg . Patient states that her pulse normal runs 59-65 and since she was changed on medication is has been running between 81-94. States her bp has also been higher than it was before the medication change- this morning was 167/70 p-83 then patient took medication , took a walk and came home to rest and bp was 159/74 p-93. She is very concerned since her pulse is normally not over 65. Patient also states that she was given a sample of Linzess 38mcg and it has been giving her diarrhea so she is going to stop taking it. Also feels her lexapro is not working. Advised patient that she needed to give the medication longer in order to see if it is working or not. Covering PCP.

## 2016-09-14 NOTE — Telephone Encounter (Signed)
PT needs to be seen and would be best if followed up by Dr. Warrick Parisian. Pt's pulse and BP is stable at this time.

## 2016-09-17 ENCOUNTER — Encounter: Payer: Self-pay | Admitting: Family Medicine

## 2016-09-17 ENCOUNTER — Ambulatory Visit (INDEPENDENT_AMBULATORY_CARE_PROVIDER_SITE_OTHER): Payer: Medicare Other | Admitting: Family Medicine

## 2016-09-17 VITALS — BP 190/88 | HR 106 | Temp 98.0°F | Ht 63.75 in | Wt 130.0 lb

## 2016-09-17 DIAGNOSIS — I10 Essential (primary) hypertension: Secondary | ICD-10-CM | POA: Diagnosis not present

## 2016-09-17 MED ORDER — ATENOLOL 25 MG PO TABS: 12.5000 mg | ORAL_TABLET | Freq: Every day | ORAL | 1 refills | Status: DC

## 2016-09-17 MED ORDER — LISINOPRIL 20 MG PO TABS: 20.0000 mg | ORAL_TABLET | Freq: Every day | ORAL | 2 refills | Status: DC

## 2016-09-17 NOTE — Assessment & Plan Note (Signed)
Increase lisinopril to 20 mg and cut atenolol in half.

## 2016-09-17 NOTE — Progress Notes (Signed)
BP (!) 190/94   Pulse (!) 106   Temp 98 F (36.7 C) (Oral)   Ht 5' 3.75" (1.619 m)   Wt 130 lb (59 kg)   LMP 05/14/2001 (Approximate) Comment: hormone replacement  BMI 22.49 kg/m    Subjective:    Patient ID: Shelly Nelson, female    DOB: [birth year], 83 y.o.   MRN: [id number]  HPI: Shelly Nelson is a 83 y.o. female presenting on 09/17/2016 for BP and pulse has been elevated since d/c Atenolol and starti   HPI Blood pressure recheck Patient is coming in for blood pressure recheck today. She says her blood pressure has been elevated and her pulse has been elevated in the 132G to 401U systolic blood pressure in 100-120 heart rate. She says this is all happened since she stopped taking the atenolol and we started her on a very low-dose of lisinopril. She would like to restart the atenolol if possible. Patient denies headaches, blurred vision, chest pains, shortness of breath, or weakness. Denies any side effects from medication and is content with current medication.   Relevant past medical, surgical, family and social history reviewed and updated as indicated. Interim medical history since our last visit reviewed. Allergies and medications reviewed and updated.  Review of Systems  Constitutional: Negative for chills and fever.  Respiratory: Negative for chest tightness and shortness of breath.   Cardiovascular: Positive for palpitations. Negative for chest pain and leg swelling.  Musculoskeletal: Negative for back pain and gait problem.  Skin: Negative for rash.  Neurological: Negative for dizziness, weakness, light-headedness, numbness and headaches.  Psychiatric/Behavioral: Negative for agitation and behavioral problems.  All other systems reviewed and are negative.   Per HPI unless specifically indicated above      Objective:    BP (!) 190/94   Pulse (!) 106   Temp 98 F (36.7 C) (Oral)   Ht 5' 3.75" (1.619 m)   Wt 130 lb (59 kg)   LMP 05/14/2001 (Approximate)  Comment: hormone replacement  BMI 22.49 kg/m   Wt Readings from Last 3 Encounters:  09/17/16 130 lb (59 kg)  09/11/16 131 lb (59.4 kg)  03/13/16 132 lb (59.9 kg)    Physical Exam  Constitutional: She is oriented to person, place, and time. She appears well-developed and well-nourished. No distress.  Eyes: Conjunctivae are normal.  Neck: Neck supple. No thyromegaly present.  Cardiovascular: Normal rate, regular rhythm, normal heart sounds and intact distal pulses.   No murmur heard. Pulmonary/Chest: Effort normal and breath sounds normal. No respiratory distress. She has no wheezes.  Musculoskeletal: Normal range of motion. She exhibits no edema or tenderness.  Lymphadenopathy:    She has no cervical adenopathy.  Neurological: She is alert and oriented to person, place, and time. Coordination normal.  Skin: Skin is warm and dry. No rash noted. She is not diaphoretic.  Psychiatric: She has a normal mood and affect. Her behavior is normal.  Nursing note and vitals reviewed.       Assessment & Plan:   Problem List Items Addressed This Visit      Cardiovascular and Mediastinum   HTN (hypertension) - Primary    Increase lisinopril to 20 mg and cut atenolol in half.      Relevant Medications   lisinopril (PRINIVIL,ZESTRIL) 20 MG tablet   atenolol (TENORMIN) 25 MG tablet       Follow up plan: Return if symptoms worsen or fail to improve.  Counseling provided for all of  the vaccine components No orders of the defined types were placed in this encounter.   Caryl Pina, MD [facility] 09/17/2016, 5:20 PM

## 2016-09-26 ENCOUNTER — Other Ambulatory Visit: Payer: Self-pay | Admitting: Family Medicine

## 2016-09-26 DIAGNOSIS — I10 Essential (primary) hypertension: Secondary | ICD-10-CM

## 2016-09-26 NOTE — Telephone Encounter (Signed)
No response from patient.  Needs to schedule an appointment.

## 2016-10-10 ENCOUNTER — Encounter: Payer: Self-pay | Admitting: Family Medicine

## 2016-10-10 ENCOUNTER — Ambulatory Visit (INDEPENDENT_AMBULATORY_CARE_PROVIDER_SITE_OTHER): Payer: Medicare Other | Admitting: Family Medicine

## 2016-10-10 VITALS — BP 191/84 | HR 73 | Temp 97.7°F | Ht 63.75 in | Wt 131.0 lb

## 2016-10-10 DIAGNOSIS — I10 Essential (primary) hypertension: Secondary | ICD-10-CM

## 2016-10-10 DIAGNOSIS — F419 Anxiety disorder, unspecified: Secondary | ICD-10-CM | POA: Diagnosis not present

## 2016-10-10 MED ORDER — LOSARTAN POTASSIUM 50 MG PO TABS: 50.0000 mg | ORAL_TABLET | Freq: Every day | ORAL | 3 refills | Status: DC

## 2016-10-10 MED ORDER — VENLAFAXINE HCL ER 37.5 MG PO CP24: 37.5000 mg | ORAL_CAPSULE | Freq: Every day | ORAL | 1 refills | Status: DC

## 2016-10-10 NOTE — Progress Notes (Signed)
BP (!) 180/80   Pulse 73   Temp 97.7 F (36.5 C) (Oral)   Ht 5' 3.75" (1.619 m)   Wt 131 lb (59.4 kg)   LMP 05/14/2001 (Approximate) Comment: hormone replacement  BMI 22.66 kg/m    Subjective:    Patient ID: Shelly Nelson, female    DOB: March 17, 1930, 83 y.o.   MRN: [id number]  HPI: Shelly Nelson is a 83 y.o. female presenting on 10/10/2016 for Hypertension (4 wk followup; has noticed cough since starting medication, not sure if it is caused by that or sinus drainage) and Anxiety (stopped Lexapro, was causing her to have hot flashes during the night, symptoms stopped when she discontinued medication)   HPI Hypertension Patient is currently on Lisinopril, and her blood pressure today is 180/80, at home she has been getting numbers between 110s over 40s to 160s over 90s. Patient denies any lightheadedness or dizziness. Patient denies headaches, blurred vision, chest pains, shortness of breath, or weakness. Denies any side effects from medication and is content with current medication except maybe a cough but she also has sinus issues and does not know its from that or from the medication.   Anxiety Patient is coming in for an anxiety recheck as well. She is still very anxious about things and her blood pressure. She tried the Lexapro for 10 days and was having hot flashes and flushing symptoms and so she stopped it and they did not come back. She did not want to try again. She denies any suicidal ideations or thoughts of hurting herself. She wants to try something different if possible.  Relevant past medical, surgical, family and social history reviewed and updated as indicated. Interim medical history since our last visit reviewed. Allergies and medications reviewed and updated.  Review of Systems  Constitutional: Negative for chills and fever.  Respiratory: Negative for chest tightness and shortness of breath.   Cardiovascular: Negative for chest pain and leg swelling.    Genitourinary: Negative for difficulty urinating and dysuria.  Musculoskeletal: Negative for back pain and gait problem.  Skin: Negative for rash.  Neurological: Negative for dizziness, light-headedness and headaches.  Psychiatric/Behavioral: Positive for dysphoric mood and sleep disturbance. Negative for agitation, behavioral problems, self-injury and suicidal ideas. The patient is nervous/anxious.   All other systems reviewed and are negative.   Per HPI unless specifically indicated above        Objective:    BP (!) 180/80   Pulse 73   Temp 97.7 F (36.5 C) (Oral)   Ht 5' 3.75" (1.619 m)   Wt 131 lb (59.4 kg)   LMP 05/14/2001 (Approximate) Comment: hormone replacement  BMI 22.66 kg/m   Wt Readings from Last 3 Encounters:  10/10/16 131 lb (59.4 kg)  09/17/16 130 lb (59 kg)  09/11/16 131 lb (59.4 kg)    Physical Exam  Constitutional: She is oriented to person, place, and time. She appears well-developed and well-nourished. No distress.  Eyes: Conjunctivae are normal.  Cardiovascular: Normal rate, regular rhythm, normal heart sounds and intact distal pulses.   No murmur heard. Pulmonary/Chest: Effort normal and breath sounds normal. No respiratory distress. She has no wheezes. She has no rales.  Musculoskeletal: Normal range of motion. She exhibits edema (Trace edema).  Neurological: She is alert and oriented to person, place, and time. Coordination normal.  Skin: Skin is warm and dry. No rash noted. She is not diaphoretic.  Psychiatric: Her behavior is normal. Judgment normal. Her mood appears anxious.  She exhibits a depressed mood. She expresses no suicidal ideation. She expresses no suicidal plans.  Nursing note and vitals reviewed.     Assessment & Plan:   Problem List Items Addressed This Visit      Cardiovascular and Mediastinum   HTN (hypertension)    Had cough and switched to losartan      Relevant Medications   losartan (COZAAR) 50 MG tablet     Other    Anxiety - Primary   Relevant Medications   venlafaxine XR (EFFEXOR XR) 37.5 MG 24 hr capsule       Follow up plan: Return in about 4 weeks (around 11/07/2016), or if symptoms worsen or fail to improve, for Recheck anxiety and hypertension.  Counseling provided for all of the vaccine components No orders of the defined types were placed in this encounter.   Caryl Pina, MD [facility] 10/10/2016, 10:16 AM

## 2016-10-10 NOTE — Assessment & Plan Note (Signed)
Had cough and switched to losartan

## 2016-10-24 ENCOUNTER — Telehealth: Payer: Self-pay | Admitting: Family Medicine

## 2016-10-24 NOTE — Telephone Encounter (Signed)
There is some amount tapering that needs to be done with Effexor when we come off of it but it's I also agree with what was said about giving it some more time and seeing if the symptoms resolve. Usually they will resolve and the medication will work very well. Give it at least 3-4 weeks before we know if symptoms are going to go away or if it's going to work for her

## 2016-10-24 NOTE — Telephone Encounter (Signed)
Patient has noticed since starting Effexor that she is having episodes of dizziness and light headedness.  She wanted to stop taking it but read that she should not stop suddenly.   Explained to patient that this symptoms are not abnormal when starting this medication but that they will normally go away as her body adjusts to it.  She wanted me to ask what you recommend.  Please advise

## 2016-10-25 NOTE — Telephone Encounter (Signed)
Pt notified of Dr Dettinger's recommendation She will try to continue the medication until follow up on 6/27 Pt will call back if sxs worsen or persist

## 2016-11-06 ENCOUNTER — Other Ambulatory Visit: Payer: Self-pay | Admitting: Family Medicine

## 2016-11-06 DIAGNOSIS — F419 Anxiety disorder, unspecified: Secondary | ICD-10-CM

## 2016-11-07 ENCOUNTER — Ambulatory Visit (INDEPENDENT_AMBULATORY_CARE_PROVIDER_SITE_OTHER): Payer: Medicare Other | Admitting: Family Medicine

## 2016-11-07 ENCOUNTER — Encounter: Payer: Self-pay | Admitting: Family Medicine

## 2016-11-07 VITALS — BP 133/73 | HR 62 | Temp 97.4°F | Ht 63.75 in | Wt 131.0 lb

## 2016-11-07 DIAGNOSIS — I10 Essential (primary) hypertension: Secondary | ICD-10-CM

## 2016-11-07 DIAGNOSIS — F419 Anxiety disorder, unspecified: Secondary | ICD-10-CM

## 2016-11-07 MED ORDER — LOSARTAN POTASSIUM 25 MG PO TABS: 25.0000 mg | ORAL_TABLET | Freq: Every day | ORAL | 2 refills | Status: DC

## 2016-11-07 MED ORDER — VENLAFAXINE HCL ER 37.5 MG PO CP24: 37.5000 mg | ORAL_CAPSULE | Freq: Every day | ORAL | 1 refills | Status: DC

## 2016-11-07 NOTE — Progress Notes (Signed)
BP 133/73   Pulse 62   Temp 97.4 F (36.3 C) (Oral)   Ht 5' 3.75" (1.619 m)   Wt 131 lb (59.4 kg)   LMP 05/14/2001 (Approximate) Comment: hormone replacement  BMI 22.66 kg/m    Subjective:    Patient ID: Shelly Nelson, female    DOB: 07-03-1929, 83 y.o.   MRN: [id number]  HPI: Shelly Nelson is a 83 y.o. female presenting on 11/07/2016 for Hypertension (4 week followup; has been taking 1/2 of 50 mg Cozaar, felt tired and weak with whole tablet) and Anxiety   HPI Anxiety recheck Patient has been doing well on the anxiety medication and she feels like it is helping and does not feel like she's had any side effects from it. She will continue on it and feels like it is lessening the severity of the frequency of her anxiety. She denies any suicidal ideations or feelings of depression or thoughts of hurting herself.  Hypertension Patient is currently on atenolol and losartan, and their blood pressure today is 133/73. Patient denies any lightheadedness or dizziness. Patient denies headaches, blurred vision, chest pains, shortness of breath, or weakness. Denies any side effects from medication and is content with current medication.   Relevant past medical, surgical, family and social history reviewed and updated as indicated. Interim medical history since our last visit reviewed. Allergies and medications reviewed and updated.  Review of Systems  Constitutional: Negative for chills and fever.  Respiratory: Negative for chest tightness and shortness of breath.   Cardiovascular: Negative for chest pain and leg swelling.  Musculoskeletal: Negative for back pain and gait problem.  Skin: Negative for rash.  Neurological: Negative for weakness, light-headedness, numbness and headaches.  Psychiatric/Behavioral: Negative for agitation, behavioral problems, dysphoric mood, self-injury, sleep disturbance and suicidal ideas. The patient is nervous/anxious.   All other systems reviewed and  are negative.   Per HPI unless specifically indicated above      Objective:    BP 133/73   Pulse 62   Temp 97.4 F (36.3 C) (Oral)   Ht 5' 3.75" (1.619 m)   Wt 131 lb (59.4 kg)   LMP 05/14/2001 (Approximate) Comment: hormone replacement  BMI 22.66 kg/m   Wt Readings from Last 3 Encounters:  11/07/16 131 lb (59.4 kg)  10/10/16 131 lb (59.4 kg)  09/17/16 130 lb (59 kg)    Physical Exam  Constitutional: She is oriented to person, place, and time. She appears well-developed and well-nourished. No distress.  Eyes: Conjunctivae are normal.  Cardiovascular: Normal rate, regular rhythm, normal heart sounds and intact distal pulses.   No murmur heard. Pulmonary/Chest: Effort normal and breath sounds normal. No respiratory distress. She has no wheezes.  Musculoskeletal: Normal range of motion. She exhibits no edema or tenderness.  Neurological: She is alert and oriented to person, place, and time. Coordination normal.  Skin: Skin is warm and dry. No rash noted. She is not diaphoretic.  Psychiatric: She has a normal mood and affect. Her behavior is normal. Judgment normal. Her mood appears not anxious. She does not exhibit a depressed mood. She expresses no suicidal ideation. She expresses no suicidal plans.  Nursing note and vitals reviewed.     Assessment & Plan:   Problem List Items Addressed This Visit      Cardiovascular and Mediastinum   HTN (hypertension) - Primary   Relevant Medications   losartan (COZAAR) 25 MG tablet     Other   Anxiety   Relevant  Medications   venlafaxine XR (EFFEXOR-XR) 37.5 MG 24 hr capsule       Follow up plan: Return in about 3 months (around 02/07/2017), or if symptoms worsen or fail to improve, for Anxiety and hypertension recheck.  Counseling provided for all of the vaccine components No orders of the defined types were placed in this encounter.   Caryl Pina, MD [facility] 11/07/2016, 11:11 AM

## 2016-11-19 ENCOUNTER — Ambulatory Visit: Payer: Medicare Other

## 2016-11-30 ENCOUNTER — Other Ambulatory Visit: Payer: Self-pay | Admitting: Family Medicine

## 2016-12-01 ENCOUNTER — Other Ambulatory Visit: Payer: Self-pay | Admitting: Family Medicine

## 2016-12-18 ENCOUNTER — Ambulatory Visit: Payer: Medicare Other | Admitting: Nurse Practitioner

## 2016-12-26 ENCOUNTER — Ambulatory Visit: Payer: Medicare Other | Admitting: Certified Nurse Midwife

## 2016-12-27 ENCOUNTER — Other Ambulatory Visit: Payer: Self-pay | Admitting: Family Medicine

## 2016-12-27 DIAGNOSIS — F419 Anxiety disorder, unspecified: Secondary | ICD-10-CM

## 2017-01-01 ENCOUNTER — Other Ambulatory Visit (HOSPITAL_COMMUNITY)
Admission: RE | Admit: 2017-01-01 | Discharge: 2017-01-01 | Disposition: A | Discharge status: Home / Self Care | Payer: Medicare Other | Source: Ambulatory Visit | Attending: Obstetrics & Gynecology | Admitting: Obstetrics & Gynecology

## 2017-01-01 ENCOUNTER — Ambulatory Visit (INDEPENDENT_AMBULATORY_CARE_PROVIDER_SITE_OTHER): Payer: Medicare Other | Admitting: Certified Nurse Midwife

## 2017-01-01 ENCOUNTER — Encounter: Payer: Self-pay | Admitting: Certified Nurse Midwife

## 2017-01-01 VITALS — BP 116/60 | HR 70 | Resp 16 | Ht 63.75 in | Wt 130.0 lb

## 2017-01-01 DIAGNOSIS — Z124 Encounter for screening for malignant neoplasm of cervix: Secondary | ICD-10-CM | POA: Diagnosis not present

## 2017-01-01 DIAGNOSIS — Z01419 Encounter for gynecological examination (general) (routine) without abnormal findings: Secondary | ICD-10-CM

## 2017-01-01 DIAGNOSIS — N952 Postmenopausal atrophic vaginitis: Secondary | ICD-10-CM

## 2017-01-01 DIAGNOSIS — N39498 Other specified urinary incontinence: Secondary | ICD-10-CM

## 2017-01-01 MED ORDER — ESTROGENS, CONJUGATED 0.625 MG/GM VA CREA: 1.0000 g | TOPICAL_CREAM | VAGINAL | 12 refills | Status: DC

## 2017-01-01 NOTE — Progress Notes (Signed)
83 y.o. G52P1001 Married  Caucasian Fe here for annual exam. Post menopausal with vaginal atrophy being treated with Premarin Cream good results. Desires continuance. Denies vaginal bleeding or vaginal pain. [facility] for long history of spontaneous incontinence, no change. Sees Endocrine for thyroid follow up with known nodules, due for [place] this year per patient. Sees PCP Dr. Warrick Parisian every 4-6 months, for labs hypertension/cholesterol/GERD and anxiety medication management with decrease in hypertension medication by new PCP, due to BP being too low. Feeling well today, walks 2 miles daily, eats good diet. No problems with ambulation or falls in last year. No other health concerns today. Has appointment with dermatology for skin check again due to previous history of skin cancer.   Patient's last menstrual period was 05/14/2001 (approximate).          Sexually active: No.  The current method of family planning is post menopausal status BTL   Exercising: Yes.    walking Smoker:  no  Health Maintenance: Pap:  4/12 normal per patient History of Abnormal Pap: no MMG:  07-11-16 category b density birads 1:neg Self Breast exams: yes Colonoscopy:  2013 normal last one BMD:   2017 TDaP:  2014 Shingles: 2009 Pneumonia: 2015 Hep C and HIV: not done Labs: pcp   reports that she has never smoked. She has never used smokeless tobacco. She reports that she does not drink alcohol or use drugs.  Past Medical History:  Diagnosis Date  . Anxiety   . Arthritis   . Basal cell carcinoma   . GERD (gastroesophageal reflux disease) 2012  . Hypercholesteremia 2000  . Hypertension 2000  . Spinal stenosis, lumbar region, without neurogenic claudication 2012  . Squamous cell carcinoma   . Thyroid disease 2013   thyroid nodule with needle aspiration    Past Surgical History:  Procedure Laterality Date  . BACK SURGERY  11/08/2010   lumbar-bulging disc  . CATARACT EXTRACTION     left eye-2011-Dr  Hunt  . CATARACT EXTRACTION W/PHACO  06/09/2012   Procedure: CATARACT EXTRACTION PHACO AND INTRAOCULAR LENS PLACEMENT (IOC);  Surgeon: Tonny Branch, MD;  Location: [facility];  Service: Ophthalmology;  Laterality: Left;  CDE=13.91  . skin cancer removal N/A 09/10/13  . THYROID LOBECTOMY  age 32  . TONSILLECTOMY     age 3  . TUBAL LIGATION      Current Outpatient Prescriptions  Medication Sig Dispense Refill  . ALPRAZolam (XANAX) 0.25 MG tablet Take 1 tablet (0.25 mg total) by mouth at bedtime as needed for anxiety. 30 tablet 1  . atenolol (TENORMIN) 25 MG tablet Take 0.5 tablets (12.5 mg total) by mouth daily. 60 tablet 1  . Calcium Carbonate-Vitamin D (CALCIUM-D) 600-400 MG-UNIT TABS Take 1 tablet by mouth daily.    Marland Kitchen conjugated estrogens (PREMARIN) vaginal cream Place 0.5 Applicatorfuls vaginally 2 (two) times a week. 42.5 g 12  . docusate sodium (DULCOLAX) 100 MG capsule Take 100 mg by mouth daily.    Marland Kitchen escitalopram (LEXAPRO) 10 MG tablet     . folic acid (FOLVITE) 169 MCG tablet Take 800 mcg by mouth daily.    . Garlic 6789 MG CAPS Take 1 capsule by mouth daily.    Marland Kitchen losartan (COZAAR) 25 MG tablet Take 1 tablet (25 mg total) by mouth daily. 90 tablet 2  . magnesium oxide (MAG-OX) 400 MG tablet Take 400 mg by mouth 2 (two) times daily.    . Multiple Vitamin (MULTIVITAMIN WITH MINERALS) TABS Take 1 tablet  by mouth daily.    . Omega-3 Fatty Acids (FISH OIL) 1200 MG CAPS Take 1,200 mg by mouth daily.    Marland Kitchen omeprazole (PRILOSEC) 40 MG capsule Take 1 Capsule by mouth once daily 90 capsule 0  . pravastatin (PRAVACHOL) 40 MG tablet Take 1 Tablet by mouth once daily 90 tablet 0  . psyllium (METAMUCIL) 58.6 % powder Take 1 packet by mouth 2 (two) times daily.    Marland Kitchen venlafaxine XR (EFFEXOR-XR) 37.5 MG 24 hr capsule Take 1 capsule (37.5 mg total) by mouth daily with breakfast. 90 capsule 1   No current facility-administered medications for this visit.     Family History  Problem Relation Age of Onset   . Hypertension Mother   . Anxiety disorder Mother   . Breast cancer Mother 54       metasis to lung and bones  . Hypertension Father   . COPD Father   . Hypertension Maternal Grandmother   . Diabetes Maternal Grandmother        type 2  . Hypertension Paternal Grandmother   . Heart disease Paternal Grandmother   . Stroke Maternal Grandfather   . COPD Paternal Grandfather   . Breast cancer Maternal Aunt   . Cancer Maternal Uncle        X 5 with various cancers    ROS:  Pertinent items are noted in HPI.  Otherwise, a comprehensive ROS was negative.  Exam:   BP 116/60   Pulse 70   Resp 16   Ht 5' 3.75" (1.619 m)   Wt 130 lb (59 kg)   LMP 05/14/2001 (Approximate)   BMI 22.49 kg/m  Height: 5' 3.75" (161.9 cm) Ht Readings from Last 3 Encounters:  01/01/17 5' 3.75" (1.619 m)  11/07/16 5' 3.75" (1.619 m)  10/10/16 5' 3.75" (1.619 m)    General appearance: alert, cooperative and appears stated age Head: Normocephalic, without obvious abnormality, atraumatic Neck: no adenopathy, supple, symmetrical, trachea midline and thyroid normal to inspection and palpation, nodular feel( known nodules) Lungs: clear to auscultation bilaterally Breasts: normal appearance, no masses or tenderness, No nipple retraction or dimpling, No nipple discharge or bleeding, No axillary or supraclavicular adenopathy Heart: regular rate and rhythm Abdomen: soft, non-tender; no masses,  no organomegaly Extremities: extremities normal, atraumatic, no cyanosis or edema Skin: Skin color, texture, turgor normal. No rashes or lesions Lymph nodes: Cervical, supraclavicular, and axillary nodes normal. No abnormal inguinal nodes palpated Neurologic: Grossly normal   Pelvic: External genitalia:  no lesions              Urethra:  normal appearing urethra with no masses, tenderness or lesions              Bartholin's and Skene's: normal                 Vagina: normal appearing vagina with normal color and  discharge, no lesions              Cervix: multiparous appearance, no bleeding following Pap, no cervical motion tenderness and no lesions              Pap taken: Yes.   Bimanual Exam:  Uterus:  normal size, contour, position, consistency, mobility, non-tender              Adnexa: normal adnexa and no mass, fullness, tenderness               Rectovaginal: Confirms  Anus:  normal sphincter tone, 2 small hemorrhoids noted, no thrombosed areas, non tender  Chaperone present: yes  A:  Well Woman with normal exam  Post menopausal no HRT  Atrophic vaginitis with good response with Premarin use  Sporadic urinary incontinence, no change, sees Urology as needed  Hypertension,cholesterol, anxiety, thyroid nodules with MD management  History of hemorrhoids no change  History of Squamous cell/basal cell skin cancer  P:   Reviewed health and wellness pertinent to exam  Discussed need to advise if vaginal bleeding   Discussed risks and benefits of Premarin use and warning signs. Patient would like to continue, due to decrease in dryness and leakage.  Rx Premarin see order with instructions  Continue follow up with MD as indicated.  Pap smear: yes   counseled on breast self exam, mammography screening, feminine hygiene, adequate intake of calcium and vitamin D, diet and exercise, Kegel's exercises  return annually or prn  An After Visit Summary was printed and given to the patient.

## 2017-01-01 NOTE — Patient Instructions (Signed)

## 2017-01-02 LAB — CYTOLOGY - PAP: Diagnosis: NEGATIVE

## 2017-01-15 ENCOUNTER — Other Ambulatory Visit: Payer: Self-pay | Admitting: Internal Medicine

## 2017-01-15 DIAGNOSIS — E042 Nontoxic multinodular goiter: Secondary | ICD-10-CM

## 2017-01-22 ENCOUNTER — Ambulatory Visit
Admission: RE | Admit: 2017-01-22 | Discharge: 2017-01-22 | Disposition: A | Discharge status: Home / Self Care | Payer: Medicare Other | Source: Ambulatory Visit | Attending: Internal Medicine | Admitting: Internal Medicine

## 2017-01-22 DIAGNOSIS — E042 Nontoxic multinodular goiter: Secondary | ICD-10-CM

## 2017-01-22 DIAGNOSIS — E041 Nontoxic single thyroid nodule: Secondary | ICD-10-CM | POA: Diagnosis not present

## 2017-02-08 ENCOUNTER — Encounter: Payer: Self-pay | Admitting: Family Medicine

## 2017-02-08 ENCOUNTER — Ambulatory Visit (INDEPENDENT_AMBULATORY_CARE_PROVIDER_SITE_OTHER): Payer: Medicare Other | Admitting: Family Medicine

## 2017-02-08 VITALS — BP 132/81 | HR 69 | Temp 97.8°F | Ht 63.75 in | Wt 129.0 lb

## 2017-02-08 DIAGNOSIS — I10 Essential (primary) hypertension: Secondary | ICD-10-CM

## 2017-02-08 DIAGNOSIS — F419 Anxiety disorder, unspecified: Secondary | ICD-10-CM

## 2017-02-08 DIAGNOSIS — E7800 Pure hypercholesterolemia, unspecified: Secondary | ICD-10-CM

## 2017-02-08 DIAGNOSIS — E039 Hypothyroidism, unspecified: Secondary | ICD-10-CM

## 2017-02-08 DIAGNOSIS — Z23 Encounter for immunization: Secondary | ICD-10-CM | POA: Diagnosis not present

## 2017-02-08 MED ORDER — LOSARTAN POTASSIUM 25 MG PO TABS: 12.5000 mg | ORAL_TABLET | Freq: Every day | ORAL | 2 refills | Status: DC

## 2017-02-08 MED ORDER — VENLAFAXINE HCL ER 75 MG PO CP24: 75.0000 mg | ORAL_CAPSULE | Freq: Every day | ORAL | 1 refills | Status: DC

## 2017-02-08 NOTE — Progress Notes (Signed)
BP 132/81   Pulse 69   Temp 97.8 F (36.6 C) (Oral)   Ht 5' 3.75" (1.619 m)   Wt 129 lb (58.5 kg)   LMP 05/14/2001 (Approximate)   BMI 22.32 kg/m    Subjective:    Patient ID: Shelly Nelson, female    DOB: 1929/07/16, 83 y.o.   MRN: [id number]  HPI: Shelly Nelson is a 83 y.o. female presenting on 02/08/2017 for Hyperlipidemia (3 mo; patient fasting) and Hypertension   HPI Hypertension Patient is currently on Losartan and atenolol, and their blood pressure today is 132/81, home readings have been in the 100s over 50s and 70s.. Patient denies any lightheadedness or dizziness. Patient denies headaches, blurred vision, chest pains, shortness of breath, or weakness. Denies any side effects from medication and is content with current medication.  Hyperlipidemia Patient is coming in for recheck of his hyperlipidemia. The patient is currently taking omega-3's and pravastatin. They deny any issues with myalgias or history of liver damage from it. They deny any focal numbness or weakness or chest pain.   Hypothyroidism recheck Patient sees endocrinology for her thyroid but just wants [place] to check the level Patient is coming in for thyroid recheck today as well. They deny any issues with hair changes or heat or cold problems or diarrhea or constipation. They deny any chest pain or palpitations. She is not currently on medication that we know of for this but they are monitoring it for now.  Anxiety recheck Patient's anxiety has been doing better since she's been on the Effexor and she denies any major side effects but she still is not completely where she would like to be. She still gets anxious about some day-to-day things and especially gets anxious when she comes into the physician's office. She denies any feelings of depression or suicidal ideations.  Relevant past medical, surgical, family and social history reviewed and updated as indicated. Interim medical history since our last  visit reviewed. Allergies and medications reviewed and updated.  Review of Systems  Constitutional: Negative for chills and fever.  Eyes: Negative for visual disturbance.  Respiratory: Negative for chest tightness and shortness of breath.   Cardiovascular: Negative for chest pain and leg swelling.  Endocrine: Negative for cold intolerance and heat intolerance.  Genitourinary: Negative for difficulty urinating and dysuria.  Musculoskeletal: Negative for back pain and gait problem.  Skin: Negative for rash.  Neurological: Negative for light-headedness and headaches.  Psychiatric/Behavioral: Negative for agitation, behavioral problems, self-injury, sleep disturbance and suicidal ideas. The patient is nervous/anxious.   All other systems reviewed and are negative.   Per HPI unless specifically indicated above   Allergies as of 02/08/2017      Reactions   Cephalexin Hives      Medication List       Accurate as of 02/08/17 10:13 AM. Always use your most recent med list.          ALPRAZolam 0.25 MG tablet Commonly known as:  XANAX Take 1 tablet (0.25 mg total) by mouth at bedtime as needed for anxiety.   atenolol 25 MG tablet Commonly known as:  TENORMIN Take 0.5 tablets (12.5 mg total) by mouth daily.   Calcium-D 600-400 MG-UNIT Tabs Take 1 tablet by mouth daily.   conjugated estrogens vaginal cream Commonly known as:  PREMARIN Place 0.5 Applicatorfuls vaginally 2 (two) times a week.   DULCOLAX 100 MG capsule Generic drug:  docusate sodium Take 100 mg by mouth daily.  Fish Oil 1200 MG Caps Take 1,200 mg by mouth daily.   folic acid 811 MCG tablet Commonly known as:  FOLVITE Take 800 mcg by mouth daily.   Garlic 9147 MG Caps Take 1 capsule by mouth daily.   losartan 25 MG tablet Commonly known as:  COZAAR Take 0.5 tablets (12.5 mg total) by mouth daily.   magnesium oxide 400 MG tablet Commonly known as:  MAG-OX Take 400 mg by mouth 2 (two) times daily.     multivitamin with minerals Tabs tablet Take 1 tablet by mouth daily.   omeprazole 40 MG capsule Commonly known as:  PRILOSEC Take 1 Capsule by mouth once daily   pravastatin 40 MG tablet Commonly known as:  PRAVACHOL Take 1 Tablet by mouth once daily   psyllium 58.6 % powder Commonly known as:  METAMUCIL Take 1 packet by mouth 2 (two) times daily.   venlafaxine XR 75 MG 24 hr capsule Commonly known as:  EFFEXOR-XR Take 1 capsule (75 mg total) by mouth daily with breakfast.            Discharge Care Instructions        Start     Ordered   02/08/17 0000  CMP14+EGFR     02/08/17 0957   02/08/17 0000  Lipid panel     02/08/17 0957   02/08/17 0000  TSH     02/08/17 1012   02/08/17 0000  losartan (COZAAR) 25 MG tablet  Daily     02/08/17 1013   02/08/17 0000  venlafaxine XR (EFFEXOR-XR) 75 MG 24 hr capsule  Daily with breakfast    Comments:  This prescription was filled on 11/06/2016. Any refills authorized will be placed on file.   02/08/17 1013         Objective:    BP 132/81   Pulse 69   Temp 97.8 F (36.6 C) (Oral)   Ht 5' 3.75" (1.619 m)   Wt 129 lb (58.5 kg)   LMP 05/14/2001 (Approximate)   BMI 22.32 kg/m   Wt Readings from Last 3 Encounters:  02/08/17 129 lb (58.5 kg)  01/01/17 130 lb (59 kg)  11/07/16 131 lb (59.4 kg)    Physical Exam  Constitutional: She is oriented to person, place, and time. She appears well-developed and well-nourished. No distress.  Eyes: Conjunctivae are normal.  Neck: Neck supple. No thyromegaly present.  Cardiovascular: Normal rate, regular rhythm, normal heart sounds and intact distal pulses.   No murmur heard. Pulmonary/Chest: Effort normal and breath sounds normal. No respiratory distress. She has no wheezes. She has no rales.  Musculoskeletal: Normal range of motion. She exhibits no edema.  Lymphadenopathy:    She has no cervical adenopathy.  Neurological: She is alert and oriented to person, place, and time.  Coordination normal.  Skin: Skin is warm and dry. No rash noted. She is not diaphoretic.  Psychiatric: Her behavior is normal. Her mood appears anxious. She does not exhibit a depressed mood. She expresses no suicidal ideation. She expresses no suicidal plans.  Nursing note and vitals reviewed.     Assessment & Plan:   Problem List Items Addressed This Visit      Cardiovascular and Mediastinum   HTN (hypertension) - Primary   Relevant Medications   losartan (COZAAR) 25 MG tablet   Other Relevant Orders   CMP14+EGFR     Endocrine   Hypothyroid   Relevant Orders   TSH     Other   HLD (hyperlipidemia)  Relevant Medications   losartan (COZAAR) 25 MG tablet   Other Relevant Orders   Lipid panel   Anxiety   Relevant Medications   venlafaxine XR (EFFEXOR-XR) 75 MG 24 hr capsule    Other Visit Diagnoses    Encounter for immunization       Relevant Orders   Flu vaccine HIGH DOSE PF (Completed)       Follow up plan: Return in about 3 months (around 05/10/2017), or if symptoms worsen or fail to improve, for anxiety recheck.  Counseling provided for all of the vaccine components Orders Placed This Encounter  Procedures  . CMP14+EGFR  . Lipid panel  . TSH    Caryl Pina, MD [facility] 02/08/2017, 10:13 AM

## 2017-02-09 LAB — CMP14+EGFR
ALBUMIN: 4.4 g/dL (ref 3.5–4.7)
ALK PHOS: 58 IU/L (ref 39–117)
ALT: 25 IU/L (ref 0–32)
AST: 40 IU/L (ref 0–40)
Albumin/Globulin Ratio: 1.9 (ref 1.2–2.2)
BILIRUBIN TOTAL: 0.4 mg/dL (ref 0.0–1.2)
BUN / CREAT RATIO: 11 — AB (ref 12–28)
BUN: 10 mg/dL (ref 8–27)
CHLORIDE: 100 mmol/L (ref 96–106)
CO2: 25 mmol/L (ref 20–29)
Calcium: 9.3 mg/dL (ref 8.7–10.3)
Creatinine, Ser: 0.91 mg/dL (ref 0.57–1.00)
GFR calc Af Amer: 67 mL/min/{1.73_m2} (ref >59)
GFR calc non Af Amer: 58 mL/min/{1.73_m2} — ABNORMAL LOW (ref >59)
GLUCOSE: 92 mg/dL (ref 65–99)
Globulin, Total: 2.3 g/dL (ref 1.5–4.5)
POTASSIUM: 4.2 mmol/L (ref 3.5–5.2)
SODIUM: 140 mmol/L (ref 134–144)
Total Protein: 6.7 g/dL (ref 6.0–8.5)

## 2017-02-09 LAB — LIPID PANEL
CHOLESTEROL TOTAL: 179 mg/dL (ref 100–199)
Chol/HDL Ratio: 2 ratio (ref 0.0–4.4)
HDL: 89 mg/dL (ref >39)
LDL Calculated: 75 mg/dL (ref 0–99)
Triglycerides: 74 mg/dL (ref 0–149)
VLDL Cholesterol Cal: 15 mg/dL (ref 5–40)

## 2017-02-09 LAB — TSH: TSH: 2.18 u[IU]/mL (ref 0.450–4.500)

## 2017-02-12 DIAGNOSIS — E042 Nontoxic multinodular goiter: Secondary | ICD-10-CM | POA: Diagnosis not present

## 2017-02-21 ENCOUNTER — Other Ambulatory Visit: Payer: Self-pay | Admitting: Family Medicine

## 2017-02-21 DIAGNOSIS — F419 Anxiety disorder, unspecified: Secondary | ICD-10-CM

## 2017-02-26 DIAGNOSIS — H35373 Puckering of macula, bilateral: Secondary | ICD-10-CM | POA: Diagnosis not present

## 2017-02-26 DIAGNOSIS — H5211 Myopia, right eye: Secondary | ICD-10-CM | POA: Diagnosis not present

## 2017-02-26 DIAGNOSIS — H52223 Regular astigmatism, bilateral: Secondary | ICD-10-CM | POA: Diagnosis not present

## 2017-02-26 DIAGNOSIS — H524 Presbyopia: Secondary | ICD-10-CM | POA: Diagnosis not present

## 2017-03-08 ENCOUNTER — Other Ambulatory Visit: Payer: Self-pay | Admitting: Family Medicine

## 2017-05-10 ENCOUNTER — Encounter: Payer: Self-pay | Admitting: Family Medicine

## 2017-05-10 ENCOUNTER — Ambulatory Visit (INDEPENDENT_AMBULATORY_CARE_PROVIDER_SITE_OTHER): Payer: Medicare Other | Admitting: Family Medicine

## 2017-05-10 VITALS — BP 170/75 | HR 73 | Temp 98.1°F | Ht 63.75 in | Wt 134.0 lb

## 2017-05-10 DIAGNOSIS — E7800 Pure hypercholesterolemia, unspecified: Secondary | ICD-10-CM

## 2017-05-10 DIAGNOSIS — E039 Hypothyroidism, unspecified: Secondary | ICD-10-CM | POA: Diagnosis not present

## 2017-05-10 DIAGNOSIS — I10 Essential (primary) hypertension: Secondary | ICD-10-CM | POA: Diagnosis not present

## 2017-05-10 DIAGNOSIS — K219 Gastro-esophageal reflux disease without esophagitis: Secondary | ICD-10-CM | POA: Diagnosis not present

## 2017-05-10 DIAGNOSIS — F411 Generalized anxiety disorder: Secondary | ICD-10-CM | POA: Diagnosis not present

## 2017-05-10 MED ORDER — ALPRAZOLAM 0.25 MG PO TABS: 0.2500 mg | ORAL_TABLET | Freq: Every evening | ORAL | 1 refills | Status: DC | PRN

## 2017-05-10 MED ORDER — LOSARTAN POTASSIUM 25 MG PO TABS: 25.0000 mg | ORAL_TABLET | Freq: Every day | ORAL | 2 refills | Status: DC

## 2017-05-10 NOTE — Progress Notes (Signed)
BP (!) 182/85   Pulse 73   Temp 98.1 F (36.7 C) (Oral)   Ht 5' 3.75" (1.619 m)   Wt 134 lb (60.8 kg)   LMP 05/14/2001 (Approximate)   BMI 23.18 kg/m    Subjective:    Patient ID: Shelly Nelson, female    DOB: 01-06-1930, 83 y.o.   MRN: [id number]  HPI: Shelly Nelson is a 83 y.o. female presenting on 05/10/2017 for Hypertension (we had cut Losartan in half to 12.5 mg at last visit, BP started to be higher so she started back on 25 mg about a month ago); Hyperlipidemia; Hypothyroidism; and Anxiety   HPI Hypertension Patient is currently on losartan and atenolol, and their blood pressure today is 182/85, her home blood pressures have been running between the 150s and 170s. Patient denies any lightheadedness or dizziness. Patient denies headaches, blurred vision, chest pains, shortness of breath, or weakness. Denies any side effects from medication and is content with current medication.  She would like to have her blood pressure medication change back to 25 mg because it has been running higher at home  Hyperlipidemia Patient is coming in for recheck of his hyperlipidemia. The patient is currently taking fish oil and garlic and pravastatin. They deny any issues with myalgias or history of liver damage from it. They deny any focal numbness or weakness or chest pain.   Hypothyroidism recheck Patient is coming in for thyroid recheck today as well. They deny any issues with hair changes or heat or cold problems or diarrhea or constipation. They deny any chest pain or palpitations. They are currently on no medication currently but we are monitoring because she has had some fluctuation  GERD Patient is currently on omeprazole and Metamucil.  She denies any major symptoms or abdominal pain or belching or burping. She denies any blood in her stool or lightheadedness or dizziness.   Anxiety Patient is coming in for recheck of anxiety.  SHe denies any suicidal ideations or thoughts of  hurting himself.  She is currently taking Effexor and alprazolam.  She says she is very happy with where she is and her current medications and would like a refill.  Relevant past medical, surgical, family and social history reviewed and updated as indicated. Interim medical history since our last visit reviewed. Allergies and medications reviewed and updated.  Review of Systems  Constitutional: Negative for chills and fever.  Eyes: Negative for visual disturbance.  Respiratory: Negative for chest tightness and shortness of breath.   Cardiovascular: Negative for chest pain and leg swelling.  Musculoskeletal: Negative for back pain and gait problem.  Skin: Negative for rash.  Neurological: Negative for dizziness, weakness, light-headedness, numbness and headaches.  Psychiatric/Behavioral: Negative for agitation, behavioral problems, decreased concentration, dysphoric mood, self-injury, sleep disturbance and suicidal ideas. The patient is nervous/anxious.   All other systems reviewed and are negative.   Per HPI unless specifically indicated above        Objective:    BP (!) 182/85   Pulse 73   Temp 98.1 F (36.7 C) (Oral)   Ht 5' 3.75" (1.619 m)   Wt 134 lb (60.8 kg)   LMP 05/14/2001 (Approximate)   BMI 23.18 kg/m   Wt Readings from Last 3 Encounters:  05/10/17 134 lb (60.8 kg)  02/08/17 129 lb (58.5 kg)  01/01/17 130 lb (59 kg)    Physical Exam  Constitutional: She is oriented to person, place, and time. She appears well-developed and well-nourished.  No distress.  Eyes: Conjunctivae are normal.  Neck: Neck supple. No thyromegaly present.  Cardiovascular: Normal rate, regular rhythm, normal heart sounds and intact distal pulses.  No murmur heard. Pulmonary/Chest: Effort normal and breath sounds normal. No respiratory distress. She has no wheezes. She has no rales.  Musculoskeletal: Normal range of motion. She exhibits no edema or tenderness.  Lymphadenopathy:    She has  no cervical adenopathy.  Neurological: She is alert and oriented to person, place, and time. Coordination normal.  Skin: Skin is warm and dry. No rash noted. She is not diaphoretic.  Psychiatric: She has a normal mood and affect. Her behavior is normal.  Nursing note and vitals reviewed.       Assessment & Plan:   Problem List Items Addressed This Visit      Cardiovascular and Mediastinum   HTN (hypertension) - Primary   Relevant Medications   losartan (COZAAR) 25 MG tablet     Digestive   GERD (gastroesophageal reflux disease)     Endocrine   Hypothyroid     Other   HLD (hyperlipidemia)   Relevant Medications   losartan (COZAAR) 25 MG tablet    Other Visit Diagnoses    Generalized anxiety disorder       Relevant Medications   ALPRAZolam (XANAX) 0.25 MG tablet       Follow up plan: Return in about 6 months (around 11/08/2017), or if symptoms worsen or fail to improve, for Recheck thyroid and hypertension.  Counseling provided for all of the vaccine components No orders of the defined types were placed in this encounter.   Caryl Pina, MD [facility] 05/10/2017, 12:28 PM

## 2017-05-20 DIAGNOSIS — L570 Actinic keratosis: Secondary | ICD-10-CM | POA: Diagnosis not present

## 2017-05-20 DIAGNOSIS — X32XXXD Exposure to sunlight, subsequent encounter: Secondary | ICD-10-CM | POA: Diagnosis not present

## 2017-05-20 DIAGNOSIS — L301 Dyshidrosis [pompholyx]: Secondary | ICD-10-CM | POA: Diagnosis not present

## 2017-05-29 ENCOUNTER — Other Ambulatory Visit: Payer: Self-pay | Admitting: Family Medicine

## 2017-06-04 ENCOUNTER — Other Ambulatory Visit: Payer: Self-pay | Admitting: Family Medicine

## 2017-06-04 MED ORDER — OMEPRAZOLE 40 MG PO CPDR: 40.0000 mg | DELAYED_RELEASE_CAPSULE | Freq: Every day | ORAL | 0 refills | Status: DC

## 2017-06-04 NOTE — Telephone Encounter (Signed)
Pt aware refill sent to [facility] 

## 2017-06-11 ENCOUNTER — Other Ambulatory Visit: Payer: Self-pay | Admitting: Family Medicine

## 2017-06-11 DIAGNOSIS — Z1231 Encounter for screening mammogram for malignant neoplasm of breast: Secondary | ICD-10-CM

## 2017-06-13 ENCOUNTER — Other Ambulatory Visit: Payer: Self-pay | Admitting: Certified Nurse Midwife

## 2017-06-13 ENCOUNTER — Encounter: Payer: Self-pay | Admitting: Certified Nurse Midwife

## 2017-06-13 DIAGNOSIS — N952 Postmenopausal atrophic vaginitis: Secondary | ICD-10-CM

## 2017-06-13 MED ORDER — ESTROGENS, CONJUGATED 0.625 MG/GM VA CREA: 1.0000 g | TOPICAL_CREAM | VAGINAL | 1 refills | Status: DC

## 2017-06-13 NOTE — Telephone Encounter (Signed)
Medication refill request: premarin cream  Last AEX:  01/01/17 DL  Next AEX: 01/02/18  Last MMG (if hormonal medication request): 07/11/16 BIRADS 1 negative  Refill authorized: Today, please advise.

## 2017-06-13 NOTE — Telephone Encounter (Signed)
°-----   Message from [place], Generic sent at 06/13/2017 2:20 PM EST -----    I need you to send a prescription for Premarin cream to [facility] in [place]. The pharmacy you sent my last prescription to has gone out of business.

## 2017-06-17 ENCOUNTER — Telehealth: Payer: Self-pay | Admitting: Certified Nurse Midwife

## 2017-06-17 NOTE — Telephone Encounter (Signed)
Patient is asking for a new prescription of premarin to be called to the pharmacy on file. Patient states that her premarin was sent to her old pharmacy that has closed.

## 2017-06-17 NOTE — Telephone Encounter (Signed)
Spoke with patient and advised that RX for Premarin was sent to [facility] in [facility] 06-13-17. Patient will with pharmacy -eh

## 2017-07-16 ENCOUNTER — Ambulatory Visit
Admission: RE | Admit: 2017-07-16 | Discharge: 2017-07-16 | Disposition: A | Discharge status: Home / Self Care | Payer: Medicare Other | Source: Ambulatory Visit | Attending: Family Medicine | Admitting: Family Medicine

## 2017-07-16 DIAGNOSIS — Z1231 Encounter for screening mammogram for malignant neoplasm of breast: Secondary | ICD-10-CM | POA: Diagnosis not present

## 2017-07-26 ENCOUNTER — Other Ambulatory Visit: Payer: Self-pay | Admitting: Family Medicine

## 2017-07-26 DIAGNOSIS — F419 Anxiety disorder, unspecified: Secondary | ICD-10-CM

## 2017-07-26 NOTE — Telephone Encounter (Signed)
6 mos rck fr Dec

## 2017-09-09 ENCOUNTER — Other Ambulatory Visit: Payer: Self-pay | Admitting: Family Medicine

## 2017-09-09 DIAGNOSIS — I10 Essential (primary) hypertension: Secondary | ICD-10-CM

## 2017-09-09 NOTE — Telephone Encounter (Signed)
OV 11/08/16

## 2017-09-25 ENCOUNTER — Other Ambulatory Visit: Payer: Self-pay | Admitting: Family Medicine

## 2017-09-25 NOTE — Telephone Encounter (Signed)
Next OV 11/08/17

## 2017-10-25 ENCOUNTER — Other Ambulatory Visit: Payer: Self-pay | Admitting: Family Medicine

## 2017-10-25 DIAGNOSIS — F419 Anxiety disorder, unspecified: Secondary | ICD-10-CM

## 2017-11-08 ENCOUNTER — Encounter: Payer: Self-pay | Admitting: Family Medicine

## 2017-11-08 ENCOUNTER — Ambulatory Visit (INDEPENDENT_AMBULATORY_CARE_PROVIDER_SITE_OTHER): Payer: Medicare Other | Admitting: Family Medicine

## 2017-11-08 VITALS — BP 132/78 | HR 73 | Temp 97.7°F | Ht 63.75 in | Wt 135.0 lb

## 2017-11-08 DIAGNOSIS — E7800 Pure hypercholesterolemia, unspecified: Secondary | ICD-10-CM

## 2017-11-08 DIAGNOSIS — K219 Gastro-esophageal reflux disease without esophagitis: Secondary | ICD-10-CM | POA: Diagnosis not present

## 2017-11-08 DIAGNOSIS — E039 Hypothyroidism, unspecified: Secondary | ICD-10-CM

## 2017-11-08 DIAGNOSIS — I10 Essential (primary) hypertension: Secondary | ICD-10-CM

## 2017-11-08 NOTE — Progress Notes (Signed)
BP 132/78   Pulse 73   Temp 97.7 F (36.5 C) (Oral)   Ht 5' 3.75" (1.619 m)   Wt 135 lb (61.2 kg)   LMP 05/14/2001 (Approximate)   BMI 23.35 kg/m    Subjective:    Patient ID: Shelly Nelson, female    DOB: December 01, 1929, 84 y.o.   MRN: [id number]  HPI: Shelly Nelson is a 84 y.o. female presenting on 11/08/2017 for Hypothyroidism; Hypertension (concerned about the Losartan recall); and Hyperlipidemia   HPI Hypothyroidism recheck Patient is coming in for thyroid recheck today as well. They deny any issues with hair changes or heat or cold problems or diarrhea or constipation. They deny any chest pain or palpitations. They are currently on no medication currently  Hypertension Patient is currently on losartan and atenolol, and their blood pressure today is 132/78. Patient denies any lightheadedness or dizziness. Patient denies headaches, blurred vision, chest pains, shortness of breath, or weakness. Denies any side effects from medication and is content with current medication.   Hyperlipidemia Patient is coming in for recheck of his hyperlipidemia. The patient is currently taking pravastatin. They deny any issues with myalgias or history of liver damage from it. They deny any focal numbness or weakness or chest pain.   GERD Patient is currently on omeprazole.  She denies any major symptoms or abdominal pain or belching or burping. She denies any blood in her stool or lightheadedness or dizziness.   Relevant past medical, surgical, family and social history reviewed and updated as indicated. Interim medical history since our last visit reviewed. Allergies and medications reviewed and updated.  Review of Systems  Constitutional: Negative for chills and fever.  HENT: Negative for congestion, ear discharge and ear pain.   Eyes: Negative for redness and visual disturbance.  Respiratory: Negative for chest tightness and shortness of breath.   Cardiovascular: Negative for chest  pain and leg swelling.  Genitourinary: Negative for difficulty urinating and dysuria.  Musculoskeletal: Negative for back pain and gait problem.  Skin: Negative for rash.  Neurological: Negative for light-headedness and headaches.  Psychiatric/Behavioral: Negative for agitation and behavioral problems.  All other systems reviewed and are negative.   Per HPI unless specifically indicated above   Allergies as of 11/08/2017      Reactions   Cephalexin Hives      Medication List        Accurate as of 11/08/17  9:20 AM. Always use your most recent med list.          ALPRAZolam 0.25 MG tablet Commonly known as:  XANAX Take 1 tablet (0.25 mg total) by mouth at bedtime as needed for anxiety.   atenolol 25 MG tablet Commonly known as:  TENORMIN TAKE 1/2 (ONE-HALF) TABLET BY MOUTH ONCE DAILY   Calcium-D 600-400 MG-UNIT Tabs Take 1 tablet by mouth daily.   conjugated estrogens vaginal cream Commonly known as:  PREMARIN Place 0.5 Applicatorfuls vaginally 2 (two) times a week.   DULCOLAX 100 MG capsule Generic drug:  docusate sodium Take 100 mg by mouth daily.   Fish Oil 1200 MG Caps Take 1,200 mg by mouth daily.   folic acid 237 MCG tablet Commonly known as:  FOLVITE Take 800 mcg by mouth daily.   Garlic 6283 MG Caps Take 1 capsule by mouth daily.   losartan 25 MG tablet Commonly known as:  COZAAR Take 1 tablet (25 mg total) by mouth daily.   magnesium oxide 400 MG tablet Commonly known as:  MAG-OX Take 400 mg by mouth 2 (two) times daily.   multivitamin with minerals Tabs tablet Take 1 tablet by mouth daily.   omeprazole 40 MG capsule Commonly known as:  PRILOSEC TAKE 1 CAPSULE BY MOUTH ONCE DAILY   pravastatin 40 MG tablet Commonly known as:  PRAVACHOL TAKE 1 TABLET BY MOUTH ONCE DAILY   psyllium 58.6 % powder Commonly known as:  METAMUCIL Take 1 packet by mouth 2 (two) times daily.   venlafaxine XR 75 MG 24 hr capsule Commonly known as:   EFFEXOR-XR TAKE 1 CAPSULE BY MOUTH ONCE DAILY WITH BREAKFAST          Objective:    BP 132/78   Pulse 73   Temp 97.7 F (36.5 C) (Oral)   Ht 5' 3.75" (1.619 m)   Wt 135 lb (61.2 kg)   LMP 05/14/2001 (Approximate)   BMI 23.35 kg/m   Wt Readings from Last 3 Encounters:  11/08/17 135 lb (61.2 kg)  05/10/17 134 lb (60.8 kg)  02/08/17 129 lb (58.5 kg)    Physical Exam  Constitutional: She is oriented to person, place, and time. She appears well-developed and well-nourished. No distress.  Eyes: Pupils are equal, round, and reactive to light. Conjunctivae and EOM are normal.  Neck: Neck supple. No thyromegaly present.  Cardiovascular: Normal rate, regular rhythm, normal heart sounds and intact distal pulses.  No murmur heard. Pulmonary/Chest: Effort normal and breath sounds normal. No respiratory distress. She has no wheezes.  Musculoskeletal: Normal range of motion. She exhibits no edema or tenderness.  Lymphadenopathy:    She has no cervical adenopathy.  Neurological: She is alert and oriented to person, place, and time. Coordination normal.  Skin: Skin is warm and dry. No rash noted. She is not diaphoretic.  Psychiatric: She has a normal mood and affect. Her behavior is normal.  Nursing note and vitals reviewed.       Assessment & Plan:   Problem List Items Addressed This Visit      Cardiovascular and Mediastinum   HTN (hypertension)   Relevant Orders   CMP14+EGFR (Completed)     Digestive   GERD (gastroesophageal reflux disease)   Relevant Orders   CBC with Differential/Platelet (Completed)     Endocrine   Hypothyroid - Primary   Relevant Orders   TSH (Completed)     Other   HLD (hyperlipidemia)   Relevant Orders   Lipid panel (Completed)      Patient had some concerns about losartan recalls, instructed patient to call her pharmacy and see if hers was under recall, if not then we will continue medication. Follow up plan: Return in about 6 months  (around 05/10/2018), or if symptoms worsen or fail to improve, for Thyroid and cholesterol recheck.  Counseling provided for all of the vaccine components Orders Placed This Encounter  Procedures  . CBC with Differential/Platelet  . CMP14+EGFR  . Lipid panel  . TSH    Caryl Pina, MD [facility] 11/08/2017, 9:20 AM

## 2017-11-09 LAB — CBC WITH DIFFERENTIAL/PLATELET
BASOS ABS: 0 10*3/uL (ref 0.0–0.2)
Basos: 1 %
EOS (ABSOLUTE): 0.3 10*3/uL (ref 0.0–0.4)
EOS: 5 %
Hematocrit: 36.9 % (ref 34.0–46.6)
Hemoglobin: 12.6 g/dL (ref 11.1–15.9)
IMMATURE GRANULOCYTES: 0 %
Immature Grans (Abs): 0 10*3/uL (ref 0.0–0.1)
Lymphocytes Absolute: 1.8 10*3/uL (ref 0.7–3.1)
Lymphs: 34 %
MCH: 31 pg (ref 26.6–33.0)
MCHC: 34.1 g/dL (ref 31.5–35.7)
MCV: 91 fL (ref 79–97)
MONOS ABS: 0.4 10*3/uL (ref 0.1–0.9)
Monocytes: 8 %
NEUTROS PCT: 52 %
Neutrophils Absolute: 2.8 10*3/uL (ref 1.4–7.0)
PLATELETS: 233 10*3/uL (ref 150–450)
RBC: 4.06 x10E6/uL (ref 3.77–5.28)
RDW: 14 % (ref 12.3–15.4)
WBC: 5.4 10*3/uL (ref 3.4–10.8)

## 2017-11-09 LAB — CMP14+EGFR
ALT: 27 IU/L (ref 0–32)
AST: 40 IU/L (ref 0–40)
Albumin/Globulin Ratio: 2 (ref 1.2–2.2)
Albumin: 4.2 g/dL (ref 3.5–4.7)
Alkaline Phosphatase: 61 IU/L (ref 39–117)
BUN/Creatinine Ratio: 10 — ABNORMAL LOW (ref 12–28)
BUN: 10 mg/dL (ref 8–27)
Bilirubin Total: 0.3 mg/dL (ref 0.0–1.2)
CO2: 26 mmol/L (ref 20–29)
CREATININE: 1.03 mg/dL — AB (ref 0.57–1.00)
Calcium: 9.2 mg/dL (ref 8.7–10.3)
Chloride: 102 mmol/L (ref 96–106)
GFR, EST AFRICAN AMERICAN: 57 mL/min/{1.73_m2} — AB (ref >59)
GFR, EST NON AFRICAN AMERICAN: 50 mL/min/{1.73_m2} — AB (ref >59)
GLUCOSE: 97 mg/dL (ref 65–99)
Globulin, Total: 2.1 g/dL (ref 1.5–4.5)
Potassium: 4.3 mmol/L (ref 3.5–5.2)
SODIUM: 140 mmol/L (ref 134–144)
Total Protein: 6.3 g/dL (ref 6.0–8.5)

## 2017-11-09 LAB — LIPID PANEL
CHOL/HDL RATIO: 2.1 ratio (ref 0.0–4.4)
Cholesterol, Total: 187 mg/dL (ref 100–199)
HDL: 89 mg/dL (ref >39)
LDL Calculated: 84 mg/dL (ref 0–99)
TRIGLYCERIDES: 70 mg/dL (ref 0–149)
VLDL Cholesterol Cal: 14 mg/dL (ref 5–40)

## 2017-11-09 LAB — TSH: TSH: 1.95 u[IU]/mL (ref 0.450–4.500)

## 2017-12-07 ENCOUNTER — Other Ambulatory Visit: Payer: Self-pay | Admitting: Family Medicine

## 2017-12-10 ENCOUNTER — Other Ambulatory Visit: Payer: Self-pay | Admitting: Family Medicine

## 2017-12-10 DIAGNOSIS — I10 Essential (primary) hypertension: Secondary | ICD-10-CM

## 2017-12-23 ENCOUNTER — Other Ambulatory Visit: Payer: Self-pay | Admitting: Family Medicine

## 2017-12-23 NOTE — Telephone Encounter (Signed)
Last lipid 02/08/17

## 2017-12-25 ENCOUNTER — Telehealth: Payer: Self-pay | Admitting: Family Medicine

## 2018-01-02 ENCOUNTER — Ambulatory Visit (INDEPENDENT_AMBULATORY_CARE_PROVIDER_SITE_OTHER): Payer: Medicare Other | Admitting: Certified Nurse Midwife

## 2018-01-02 ENCOUNTER — Encounter: Payer: Self-pay | Admitting: Certified Nurse Midwife

## 2018-01-02 ENCOUNTER — Other Ambulatory Visit: Payer: Self-pay

## 2018-01-02 VITALS — BP 120/60 | HR 68 | Resp 16 | Ht 63.75 in | Wt 136.0 lb

## 2018-01-02 DIAGNOSIS — Z8719 Personal history of other diseases of the digestive system: Secondary | ICD-10-CM

## 2018-01-02 DIAGNOSIS — Z01411 Encounter for gynecological examination (general) (routine) with abnormal findings: Secondary | ICD-10-CM | POA: Diagnosis not present

## 2018-01-02 DIAGNOSIS — K625 Hemorrhage of anus and rectum: Secondary | ICD-10-CM

## 2018-01-02 DIAGNOSIS — Z780 Asymptomatic menopausal state: Secondary | ICD-10-CM

## 2018-01-02 NOTE — Progress Notes (Signed)
84 y.o. G15P1001 Married  Caucasian Fe here for annual exam. Post menopausal denies vaginal bleeding or vaginal dryness issues. Uses Vaseline on external vulva for protection with pad use only. Some incontinence if unable to void soon enough. No leaking otherwise. Patient has noted rectal bleeding for the past two months? With wiping and increase amount on pad. Does not feel it is from vagina. Noted some today, bright red on pad. History of hemorrhoids, but has noted no pain. Denies black tarry stools or blood on stool. History of polyps on colonoscopy in past, last one in 2013 was negative per patient. Denies constipation or diarrhea or abdominal pain or weakness. PCP visit on 11/08/17 with all labs normal(personally reviewed on her phone, Hgb. 12.6) eating well, ambulating and driving without issues. Spouse still healthy except for arthritis. No other health issues today.  Patient's last menstrual period was 05/14/2001 (approximate).          Sexually active: No.  The current method of family planning is post menopausal status.    Exercising: Yes.    walking Smoker:  no  Review of Systems  Constitutional: Negative.   HENT: Negative.   Eyes: Negative.   Respiratory: Negative.   Cardiovascular: Negative.   Gastrointestinal:       Rectal bleeding  Musculoskeletal: Negative.   Skin: Negative.   Neurological: Negative.   Endo/Heme/Allergies: Negative.   Psychiatric/Behavioral: Negative.     Health Maintenance: Pap:  4/12 neg, 01-01-17 neg History of Abnormal Pap: no MMG:  07-16-17 category b density birads 1:neg Self breast exams: yes Colonoscopy:  2013 normal BMD:   2017 osteopenia 2 years TDaP:  2014 Shingles: 2009 Pneumonia: 2015 Hep C and HIV: not done declines Labs: declines   reports that she has never smoked. She has never used smokeless tobacco. She reports that she does not drink alcohol or use drugs.  Past Medical History:  Diagnosis Date  . Anxiety   . Arthritis   . Basal  cell carcinoma   . GERD (gastroesophageal reflux disease) 2012  . Hypercholesteremia 2000  . Hypertension 2000  . Spinal stenosis, lumbar region, without neurogenic claudication 2012  . Squamous cell carcinoma   . Thyroid disease 2013   thyroid nodule with needle aspiration    Past Surgical History:  Procedure Laterality Date  . BACK SURGERY  11/08/2010   lumbar-bulging disc  . CATARACT EXTRACTION     left eye-2011-Dr Hunt  . CATARACT EXTRACTION W/PHACO  06/09/2012   Procedure: CATARACT EXTRACTION PHACO AND INTRAOCULAR LENS PLACEMENT (IOC);  Surgeon: Tonny Branch, MD;  Location: [facility];  Service: Ophthalmology;  Laterality: Left;  CDE=13.91  . skin cancer removal N/A 09/10/13  . THYROID LOBECTOMY  age 29  . TONSILLECTOMY     age 69  . TUBAL LIGATION      Current Outpatient Medications  Medication Sig Dispense Refill  . ALPRAZolam (XANAX) 0.25 MG tablet Take 1 tablet (0.25 mg total) by mouth at bedtime as needed for anxiety. 30 tablet 1  . atenolol (TENORMIN) 25 MG tablet TAKE 1/2 (ONE-HALF) TABLET BY MOUTH ONCE DAILY 75 tablet 1  . Calcium Carbonate-Vitamin D (CALCIUM-D) 600-400 MG-UNIT TABS Take 1 tablet by mouth daily.    Marland Kitchen conjugated estrogens (PREMARIN) vaginal cream Place 0.5 Applicatorfuls vaginally 2 (two) times a week. 42.5 g 1  . docusate sodium (DULCOLAX) 100 MG capsule Take 100 mg by mouth daily.    . folic acid (FOLVITE) 956 MCG tablet Take 800 mcg by mouth  daily.    . Garlic 7341 MG CAPS Take 1 capsule by mouth daily.    Marland Kitchen losartan (COZAAR) 25 MG tablet Take 1 tablet (25 mg total) by mouth daily. 90 tablet 2  . magnesium oxide (MAG-OX) 400 MG tablet Take 400 mg by mouth 2 (two) times daily.    . Multiple Vitamin (MULTIVITAMIN WITH MINERALS) TABS Take 1 tablet by mouth daily.    . Omega-3 Fatty Acids (FISH OIL) 1200 MG CAPS Take 1,200 mg by mouth daily.    Marland Kitchen omeprazole (PRILOSEC) 40 MG capsule TAKE 1 CAPSULE BY MOUTH ONCE DAILY 90 capsule 1  . pravastatin (PRAVACHOL) 40  MG tablet TAKE 1 TABLET BY MOUTH ONCE DAILY 90 tablet 0  . psyllium (METAMUCIL) 58.6 % powder Take 1 packet by mouth 2 (two) times daily.    Marland Kitchen venlafaxine XR (EFFEXOR-XR) 75 MG 24 hr capsule TAKE 1 CAPSULE BY MOUTH ONCE DAILY WITH BREAKFAST 90 capsule 0   No current facility-administered medications for this visit.     Family History  Problem Relation Age of Onset  . Hypertension Mother   . Anxiety disorder Mother   . Breast cancer Mother 3       metasis to lung and bones  . Hypertension Father   . COPD Father   . Hypertension Maternal Grandmother   . Diabetes Maternal Grandmother        type 2  . Hypertension Paternal Grandmother   . Heart disease Paternal Grandmother   . Stroke Maternal Grandfather   . COPD Paternal Grandfather   . Breast cancer Maternal Aunt   . Cancer Maternal Uncle        X 5 with various cancers    ROS:  Pertinent items are noted in HPI.  Otherwise, a comprehensive ROS was negative.  Exam:   BP 120/60   Pulse 68   Resp 16   Ht 5' 3.75" (1.619 m)   Wt 136 lb (61.7 kg)   LMP 05/14/2001 (Approximate)   BMI 23.53 kg/m  Height: 5' 3.75" (161.9 cm) Ht Readings from Last 3 Encounters:  01/02/18 5' 3.75" (1.619 m)  11/08/17 5' 3.75" (1.619 m)  05/10/17 5' 3.75" (1.619 m)    General appearance: alert, cooperative and appears stated age Head: Normocephalic, without obvious abnormality, atraumatic Neck: no adenopathy, supple, symmetrical, trachea midline and thyroid normal to inspection and palpation and nodular Lungs: clear to auscultation bilaterally Breasts: normal appearance, no masses or tenderness, No nipple retraction or dimpling, No nipple discharge or bleeding, No axillary or supraclavicular adenopathy, pendulous blateral Heart: regular rate and rhythm Abdomen: soft, non-tender; no masses,  no organomegaly Extremities: extremities normal, atraumatic, no cyanosis or edema Skin: Skin color, texture, turgor normal. No rashes or lesions Lymph  nodes: Cervical, supraclavicular, and axillary nodes normal. No abnormal inguinal nodes palpated Neurologic: Grossly normal   Pelvic: External genitalia:  no lesions              Urethra:  normal appearing urethra with no masses, tenderness or lesions              Bartholin's and Skene's: normal                 Vagina: normal appearing vagina with normal color and discharge, no lesions, no blood present prior to pelvic exam              Cervix: multiparous appearance, no cervical motion tenderness and no lesions  Pap taken: No. Bimanual Exam:  Uterus:  normal size, contour, position, consistency, mobility, non-tender and anteverted              Adnexa: normal adnexa and no mass, fullness, tenderness               Rectovaginal: Confirms               Anus:  normal sphincter tone, hemorrhoids noted around anal opening, with inspection hemorrhoid? Vs mass at 12 o'clock noted to be oozing slightly red blood, very tiny amount. Tissue has a concerning appearance. On rectal exam, appears to palpate up into canal with gentle inspection. Non painful to touch. No frank bleeding noted  Chaperone present: yes  A:  Well Woman with normal exam  Post menopausal no HRT  Vaginal dryness external only uses vaseline with no issues  Rectal bleeding from questionable mass/hemorrhoid over the past 2-3 months. ( amount on pad was small to moderate amount.  Hypertension/aex/labs with PCP management  P:   Reviewed health and wellness pertinent to exam  Aware of need to advise if vaginal bleeding  Discussed rectal finding and need to see GI for evaluation. Patient agreeable, and will be scheduled prior to leaving today.  Continue follow up with PCP as indicated  Pap smear: no   counseled on breast self exam, mammography screening, feminine hygiene, osteoporosis, adequate intake of calcium and vitamin D, diet and exercise  return annually or prn  An After Visit Summary was printed and given to the  patient.

## 2018-01-02 NOTE — Progress Notes (Signed)
RN spoke with Shelly Nelson at [facility] and patient scheduled while still in office for f/u of rectal bleeding and possible mass noted on exam today. Patient scheduled for Monday 01-06-18 at 1415 with Dr. Therisa Doyne. Patient agreeable to date and time of appointment. Will fax office notes to [facility] fax #: [phone].

## 2018-01-02 NOTE — Patient Instructions (Signed)
EXERCISE AND DIET:  We recommended that you start or continue a regular exercise program for good health. Regular exercise means any activity that makes your heart beat faster and makes you sweat.  We recommend exercising at least 30 minutes per day at least 3 days a week, preferably 4 or 5.  We also recommend a diet low in fat and sugar.  Inactivity, poor dietary choices and obesity can cause diabetes, heart attack, stroke, and kidney damage, among others.    ALCOHOL AND SMOKING:  Women should limit their alcohol intake to no more than 7 drinks/beers/glasses of wine (combined, not each!) per week. Moderation of alcohol intake to this level decreases your risk of breast cancer and liver damage. And of course, no recreational drugs are part of a healthy lifestyle.  And absolutely no smoking or even second hand smoke. Most people know smoking can cause heart and lung diseases, but did you know it also contributes to weakening of your bones? Aging of your skin?  Yellowing of your teeth and nails?  CALCIUM AND VITAMIN D:  Adequate intake of calcium and Vitamin D are recommended.  The recommendations for exact amounts of these supplements seem to change often, but generally speaking 600 mg of calcium (either carbonate or citrate) and 800 units of Vitamin D per day seems prudent. Certain women may benefit from higher intake of Vitamin D.  If you are among these women, your doctor will have told you during your visit.    PAP SMEARS:  Pap smears, to check for cervical cancer or precancers,  have traditionally been done yearly, although recent scientific advances have shown that most women can have pap smears less often.  However, every woman still should have a physical exam from her gynecologist every year. It will include a breast check, inspection of the vulva and vagina to check for abnormal growths or skin changes, a visual exam of the cervix, and then an exam to evaluate the size and shape of the uterus and  ovaries.  And after 82 years of age, a rectal exam is indicated to check for rectal cancers. We will also provide age appropriate advice regarding health maintenance, like when you should have certain vaccines, screening for sexually transmitted diseases, bone density testing, colonoscopy, mammograms, etc.   MAMMOGRAMS:  All women over 40 years old should have a yearly mammogram. Many facilities now offer a "3D" mammogram, which may cost around $50 extra out of pocket. If possible,  we recommend you accept the option to have the 3D mammogram performed.  It both reduces the number of women who will be called back for extra views which then turn out to be normal, and it is better than the routine mammogram at detecting truly abnormal areas.    COLONOSCOPY:  Colonoscopy to screen for colon cancer is recommended for all women at age 50.  We know, you hate the idea of the prep.  We agree, BUT, having colon cancer and not knowing it is worse!!  Colon cancer so often starts as a polyp that can be seen and removed at colonscopy, which can quite literally save your life!  And if your first colonoscopy is normal and you have no family history of colon cancer, most women don't have to have it again for 10 years.  Once every ten years, you can do something that may end up saving your life, right?  We will be happy to help you get it scheduled when you are ready.    Be sure to check your insurance coverage so you understand how much it will cost.  It may be covered as a preventative service at no cost, but you should check your particular policy.      Rectal Bleeding Rectal bleeding is when blood comes out of the opening of the butt (anus). People with this kind of bleeding may notice bright red blood in their underwear or in the toilet after they poop (have a bowel movement). They may also have dark red or black poop (stool). Rectal bleeding is often a sign that something is wrong. It needs to be checked by a  doctor. Follow these instructions at home: Watch for any changes in your condition. Take these actions to help with bleeding and discomfort:  Eat a diet that is high in fiber. This will keep your poop soft so it is easier for you to poop without pushing too hard. Ask your doctor to tell you what foods and drinks are high in fiber.  Drink enough fluid to keep your pee (urine) clear or pale yellow. This also helps keep your poop soft.  Try taking a warm bath. This may help with pain.  Keep all follow-up visits as told by your doctor. This is important.  Get help right away if:  You have new bleeding.  You have more bleeding than before.  You have black or dark red poop.  You throw up (vomit) blood or something that looks like coffee grounds.  You have pain or tenderness in your belly (abdomen).  You have a fever.  You feel weak.  You feel sick to your stomach (nauseous).  You pass out (faint).  You have very bad pain in your butt.  You cannot poop. This information is not intended to replace advice given to you by your health care provider. Make sure you discuss any questions you have with your health care provider. Document Released: 01/10/2011 Document Revised: 10/06/2015 Document Reviewed: 06/26/2015 Elsevier Interactive Patient Education  [employer].

## 2018-01-06 DIAGNOSIS — K648 Other hemorrhoids: Secondary | ICD-10-CM | POA: Diagnosis not present

## 2018-01-06 DIAGNOSIS — Z86010 Personal history of colonic polyps: Secondary | ICD-10-CM | POA: Diagnosis not present

## 2018-01-06 DIAGNOSIS — K625 Hemorrhage of anus and rectum: Secondary | ICD-10-CM | POA: Diagnosis not present

## 2018-01-22 ENCOUNTER — Other Ambulatory Visit: Payer: Self-pay | Admitting: Family Medicine

## 2018-01-22 DIAGNOSIS — F419 Anxiety disorder, unspecified: Secondary | ICD-10-CM

## 2018-01-29 ENCOUNTER — Encounter: Payer: Self-pay | Admitting: Certified Nurse Midwife

## 2018-01-29 DIAGNOSIS — K648 Other hemorrhoids: Secondary | ICD-10-CM | POA: Diagnosis not present

## 2018-01-29 DIAGNOSIS — K625 Hemorrhage of anus and rectum: Secondary | ICD-10-CM | POA: Diagnosis not present

## 2018-01-29 DIAGNOSIS — K5730 Diverticulosis of large intestine without perforation or abscess without bleeding: Secondary | ICD-10-CM | POA: Diagnosis not present

## 2018-01-29 DIAGNOSIS — D124 Benign neoplasm of descending colon: Secondary | ICD-10-CM | POA: Diagnosis not present

## 2018-01-29 DIAGNOSIS — D120 Benign neoplasm of cecum: Secondary | ICD-10-CM | POA: Diagnosis not present

## 2018-01-31 DIAGNOSIS — D120 Benign neoplasm of cecum: Secondary | ICD-10-CM | POA: Diagnosis not present

## 2018-01-31 DIAGNOSIS — D124 Benign neoplasm of descending colon: Secondary | ICD-10-CM | POA: Diagnosis not present

## 2018-02-07 ENCOUNTER — Other Ambulatory Visit: Payer: Self-pay | Admitting: Family Medicine

## 2018-02-07 DIAGNOSIS — I10 Essential (primary) hypertension: Secondary | ICD-10-CM

## 2018-02-20 ENCOUNTER — Ambulatory Visit (INDEPENDENT_AMBULATORY_CARE_PROVIDER_SITE_OTHER): Payer: Medicare Other

## 2018-02-20 DIAGNOSIS — Z23 Encounter for immunization: Secondary | ICD-10-CM

## 2018-03-05 DIAGNOSIS — H52223 Regular astigmatism, bilateral: Secondary | ICD-10-CM | POA: Diagnosis not present

## 2018-03-05 DIAGNOSIS — H5211 Myopia, right eye: Secondary | ICD-10-CM | POA: Diagnosis not present

## 2018-03-05 DIAGNOSIS — Z961 Presence of intraocular lens: Secondary | ICD-10-CM | POA: Diagnosis not present

## 2018-03-05 DIAGNOSIS — H524 Presbyopia: Secondary | ICD-10-CM | POA: Diagnosis not present

## 2018-03-20 ENCOUNTER — Other Ambulatory Visit: Payer: Self-pay | Admitting: Family Medicine

## 2018-04-20 ENCOUNTER — Other Ambulatory Visit: Payer: Self-pay | Admitting: Family Medicine

## 2018-04-20 DIAGNOSIS — F419 Anxiety disorder, unspecified: Secondary | ICD-10-CM

## 2018-05-08 ENCOUNTER — Other Ambulatory Visit: Payer: Self-pay | Admitting: Family Medicine

## 2018-05-08 DIAGNOSIS — I10 Essential (primary) hypertension: Secondary | ICD-10-CM

## 2018-05-08 NOTE — Telephone Encounter (Signed)
OV 05/12/18 

## 2018-05-12 ENCOUNTER — Encounter: Payer: Self-pay | Admitting: Family Medicine

## 2018-05-12 ENCOUNTER — Ambulatory Visit (INDEPENDENT_AMBULATORY_CARE_PROVIDER_SITE_OTHER): Payer: Medicare Other | Admitting: Family Medicine

## 2018-05-12 VITALS — BP 147/74 | HR 71 | Temp 96.9°F | Ht 63.75 in | Wt 141.4 lb

## 2018-05-12 DIAGNOSIS — K219 Gastro-esophageal reflux disease without esophagitis: Secondary | ICD-10-CM

## 2018-05-12 DIAGNOSIS — F419 Anxiety disorder, unspecified: Secondary | ICD-10-CM

## 2018-05-12 DIAGNOSIS — E039 Hypothyroidism, unspecified: Secondary | ICD-10-CM

## 2018-05-12 DIAGNOSIS — I10 Essential (primary) hypertension: Secondary | ICD-10-CM

## 2018-05-12 DIAGNOSIS — E7800 Pure hypercholesterolemia, unspecified: Secondary | ICD-10-CM | POA: Diagnosis not present

## 2018-05-12 DIAGNOSIS — S39012A Strain of muscle, fascia and tendon of lower back, initial encounter: Secondary | ICD-10-CM

## 2018-05-12 MED ORDER — OMEPRAZOLE 40 MG PO CPDR: 40.0000 mg | DELAYED_RELEASE_CAPSULE | Freq: Every day | ORAL | 3 refills | Status: DC

## 2018-05-12 MED ORDER — NAPROXEN 500 MG PO TABS: 500.0000 mg | ORAL_TABLET | Freq: Two times a day (BID) | ORAL | 0 refills | Status: DC

## 2018-05-12 MED ORDER — LOSARTAN POTASSIUM 25 MG PO TABS: 25.0000 mg | ORAL_TABLET | Freq: Every day | ORAL | 3 refills | Status: DC

## 2018-05-12 MED ORDER — ATENOLOL 25 MG PO TABS: ORAL_TABLET | ORAL | 3 refills | Status: DC

## 2018-05-12 MED ORDER — VENLAFAXINE HCL ER 75 MG PO CP24: ORAL_CAPSULE | ORAL | 3 refills | Status: DC

## 2018-05-12 MED ORDER — PRAVASTATIN SODIUM 40 MG PO TABS: 40.0000 mg | ORAL_TABLET | Freq: Every day | ORAL | 3 refills | Status: DC

## 2018-05-12 NOTE — Progress Notes (Signed)
BP (!) 147/74   Pulse 71   Temp (!) 96.9 F (36.1 C) (Oral)   Ht 5' 3.75" (1.619 m)   Wt 141 lb 6.4 oz (64.1 kg)   LMP 05/14/2001 (Approximate)   BMI 24.46 kg/m    Subjective:    Patient ID: Shelly Nelson, female    DOB: 26-Jan-1930, 84 y.o.   MRN: [id number]  HPI: Shelly Nelson is a 84 y.o. female presenting on 05/12/2018 for Hypertension (6 month follow up); Hyperlipidemia; and Back Pain (Patient states it started yesterday when she woke up)   HPI Hypertension Patient is currently on atenolol and losartan, and their blood pressure today is 147/74. Patient denies any lightheadedness or dizziness. Patient denies headaches, blurred vision, chest pains, shortness of breath, or weakness. Denies any side effects from medication and is content with current medication.   Hypothyroidism recheck Patient is coming in for thyroid recheck today as well. They deny any issues with hair changes or heat or cold problems or diarrhea or constipation. They deny any chest pain or palpitations. They are currently on no medication for it but we have been keeping track of it because she has been borderline  Hyperlipidemia Patient is coming in for recheck of his hyperlipidemia. The patient is currently taking fish oil and pravastatin. They deny any issues with myalgias or history of liver damage from it. They deny any focal numbness or weakness or chest pain.   GERD Patient is currently on omeprazole.  She denies any major symptoms or abdominal pain or belching or burping. She denies any blood in her stool or lightheadedness or dizziness.   Patient is also coming in for anxiety recheck.  She needs refill on her medications and has been doing well with the Effexor and says that it is keeping her anxiety down much lower than it had been previous. Depression screen [facility] 2/9 05/12/2018 11/08/2017 05/10/2017 02/08/2017 11/07/2016  Decreased Interest 0 0 0 0 0  Down, Depressed, Hopeless 0 0 0 0 0  PHQ - 2  Score 0 0 0 0 0    Patient has been having low back pain on both sides since yesterday morning.  She does believe that the day before she was reaching down towards something and had to do a lot of reaching and bending and may have strained her back from that.  She says the back pain is in a bandlike area across her lower back and is moderate in severity and she did take some Advil yesterday and it did help and then it came back again this morning.  She says it is worse when she tries to get up and moving it first in the morning or after prolonged sitting and then it will improve from there.  She denies any pain radiating anywhere else.  Relevant past medical, surgical, family and social history reviewed and updated as indicated. Interim medical history since our last visit reviewed. Allergies and medications reviewed and updated.  Review of Systems  Constitutional: Negative for chills and fever.  HENT: Negative for congestion, ear discharge and ear pain.   Eyes: Negative for visual disturbance.  Respiratory: Negative for chest tightness and shortness of breath.   Cardiovascular: Negative for chest pain and leg swelling.  Gastrointestinal: Negative for abdominal pain.  Genitourinary: Negative for difficulty urinating and dysuria.  Musculoskeletal: Positive for back pain. Negative for gait problem.  Skin: Negative for rash.  Neurological: Negative for light-headedness and headaches.  Psychiatric/Behavioral: Negative for agitation  and behavioral problems.  All other systems reviewed and are negative.   Per HPI unless specifically indicated above   Allergies as of 05/12/2018      Reactions   Cephalexin Hives      Medication List       Accurate as of May 12, 2018  8:27 AM. Always use your most recent med list.        ALPRAZolam 0.25 MG tablet Commonly known as:  XANAX Take 1 tablet (0.25 mg total) by mouth at bedtime as needed for anxiety.   atenolol 25 MG tablet Commonly  known as:  TENORMIN TAKE 1/2 (ONE-HALF) TABLET BY MOUTH ONCE DAILY   Calcium-D 600-400 MG-UNIT Tabs Take 1 tablet by mouth daily.   conjugated estrogens vaginal cream Commonly known as:  PREMARIN Place 0.5 Applicatorfuls vaginally 2 (two) times a week.   DULCOLAX 100 MG capsule Generic drug:  docusate sodium Take 100 mg by mouth daily.   Fish Oil 1200 MG Caps Take 1,200 mg by mouth daily.   folic acid 300 MCG tablet Commonly known as:  FOLVITE Take 800 mcg by mouth daily.   Garlic 7622 MG Caps Take 1 capsule by mouth daily.   losartan 25 MG tablet Commonly known as:  COZAAR TAKE 1 TABLET BY MOUTH ONCE DAILY   magnesium oxide 400 MG tablet Commonly known as:  MAG-OX Take 400 mg by mouth 2 (two) times daily.   multivitamin with minerals Tabs tablet Take 1 tablet by mouth daily.   omeprazole 40 MG capsule Commonly known as:  PRILOSEC TAKE 1 CAPSULE BY MOUTH ONCE DAILY   pravastatin 40 MG tablet Commonly known as:  PRAVACHOL TAKE 1 TABLET BY MOUTH ONCE DAILY   psyllium 58.6 % powder Commonly known as:  METAMUCIL Take 1 packet by mouth 2 (two) times daily.   venlafaxine XR 75 MG 24 hr capsule Commonly known as:  EFFEXOR-XR TAKE 1 CAPSULE BY MOUTH ONCE DAILY WITH BREAKFAST          Objective:    BP (!) 147/74   Pulse 71   Temp (!) 96.9 F (36.1 C) (Oral)   Ht 5' 3.75" (1.619 m)   Wt 141 lb 6.4 oz (64.1 kg)   LMP 05/14/2001 (Approximate)   BMI 24.46 kg/m   Wt Readings from Last 3 Encounters:  05/12/18 141 lb 6.4 oz (64.1 kg)  01/02/18 136 lb (61.7 kg)  11/08/17 135 lb (61.2 kg)    Physical Exam Vitals signs and nursing note reviewed.  Constitutional:      General: She is not in acute distress.    Appearance: She is well-developed. She is not diaphoretic.  Eyes:     Conjunctiva/sclera: Conjunctivae normal.     Pupils: Pupils are equal, round, and reactive to light.  Cardiovascular:     Rate and Rhythm: Normal rate and regular rhythm.     Heart  sounds: Normal heart sounds. No murmur.  Pulmonary:     Effort: Pulmonary effort is normal. No respiratory distress.     Breath sounds: Normal breath sounds. No wheezing.  Musculoskeletal: Normal range of motion.        General: No tenderness (Bilateral low back pain, no tenderness to palpation, negative straight leg raise.  In a bandlike area across her lower back.).  Skin:    General: Skin is warm and dry.     Findings: No rash.  Neurological:     Mental Status: She is alert and oriented to person, place,  and time.     Coordination: Coordination normal.  Psychiatric:        Behavior: Behavior normal.         Assessment & Plan:   Problem List Items Addressed This Visit      Cardiovascular and Mediastinum   HTN (hypertension)   Relevant Medications   atenolol (TENORMIN) 25 MG tablet   losartan (COZAAR) 25 MG tablet   pravastatin (PRAVACHOL) 40 MG tablet   Other Relevant Orders   CMP14+EGFR     Digestive   GERD (gastroesophageal reflux disease)   Relevant Medications   omeprazole (PRILOSEC) 40 MG capsule     Endocrine   Hypothyroid - Primary   Relevant Medications   atenolol (TENORMIN) 25 MG tablet   Other Relevant Orders   TSH     Other   HLD (hyperlipidemia)   Relevant Medications   atenolol (TENORMIN) 25 MG tablet   losartan (COZAAR) 25 MG tablet   pravastatin (PRAVACHOL) 40 MG tablet   Other Relevant Orders   Lipid panel   Anxiety   Relevant Medications   venlafaxine XR (EFFEXOR-XR) 75 MG 24 hr capsule    Other Visit Diagnoses    Lumbar strain, initial encounter       Relevant Medications   naproxen (NAPROSYN) 500 MG tablet      Continue current medication, will give naproxen for her lower back and she will return if does not improve or worsens.  She has already had previous back surgery, recommended orthopedic spinal person that did her previous surgery if she continues to have issues. Follow up plan: Return in about 6 months (around 11/11/2018), or  if symptoms worsen or fail to improve, for Hypertension and thyroid.  Counseling provided for all of the vaccine components No orders of the defined types were placed in this encounter.   Caryl Pina, MD [facility] 05/12/2018, 8:27 AM

## 2018-05-13 LAB — CMP14+EGFR
ALBUMIN: 4.2 g/dL (ref 3.5–4.7)
ALT: 25 IU/L (ref 0–32)
AST: 38 IU/L (ref 0–40)
Albumin/Globulin Ratio: 1.7 (ref 1.2–2.2)
Alkaline Phosphatase: 67 IU/L (ref 39–117)
BUN / CREAT RATIO: 13 (ref 12–28)
BUN: 14 mg/dL (ref 8–27)
Bilirubin Total: 0.4 mg/dL (ref 0.0–1.2)
CALCIUM: 9.4 mg/dL (ref 8.7–10.3)
CO2: 24 mmol/L (ref 20–29)
CREATININE: 1.1 mg/dL — AB (ref 0.57–1.00)
Chloride: 100 mmol/L (ref 96–106)
GFR, EST AFRICAN AMERICAN: 53 mL/min/{1.73_m2} — AB (ref >59)
GFR, EST NON AFRICAN AMERICAN: 46 mL/min/{1.73_m2} — AB (ref >59)
GLOBULIN, TOTAL: 2.5 g/dL (ref 1.5–4.5)
GLUCOSE: 95 mg/dL (ref 65–99)
Potassium: 4.4 mmol/L (ref 3.5–5.2)
Sodium: 139 mmol/L (ref 134–144)
TOTAL PROTEIN: 6.7 g/dL (ref 6.0–8.5)

## 2018-05-13 LAB — LIPID PANEL
CHOL/HDL RATIO: 2.2 ratio (ref 0.0–4.4)
Cholesterol, Total: 196 mg/dL (ref 100–199)
HDL: 89 mg/dL (ref >39)
LDL CALC: 87 mg/dL (ref 0–99)
Triglycerides: 99 mg/dL (ref 0–149)
VLDL CHOLESTEROL CAL: 20 mg/dL (ref 5–40)

## 2018-05-13 LAB — TSH: TSH: 2.43 u[IU]/mL (ref 0.450–4.500)

## 2018-06-16 ENCOUNTER — Other Ambulatory Visit: Payer: Self-pay | Admitting: Family Medicine

## 2018-06-16 DIAGNOSIS — Z1231 Encounter for screening mammogram for malignant neoplasm of breast: Secondary | ICD-10-CM

## 2018-07-17 ENCOUNTER — Other Ambulatory Visit: Payer: Self-pay | Admitting: Family Medicine

## 2018-07-17 DIAGNOSIS — F411 Generalized anxiety disorder: Secondary | ICD-10-CM

## 2018-07-19 ENCOUNTER — Other Ambulatory Visit: Payer: Self-pay | Admitting: Family Medicine

## 2018-07-19 DIAGNOSIS — F411 Generalized anxiety disorder: Secondary | ICD-10-CM

## 2018-07-21 ENCOUNTER — Ambulatory Visit
Admission: RE | Admit: 2018-07-21 | Discharge: 2018-07-21 | Disposition: A | Discharge status: Home / Self Care | Payer: Medicare Other | Source: Ambulatory Visit | Attending: Family Medicine | Admitting: Family Medicine

## 2018-07-21 ENCOUNTER — Ambulatory Visit: Payer: Medicare Other

## 2018-07-21 DIAGNOSIS — Z1231 Encounter for screening mammogram for malignant neoplasm of breast: Secondary | ICD-10-CM | POA: Diagnosis not present

## 2018-07-22 ENCOUNTER — Other Ambulatory Visit: Payer: Self-pay | Admitting: Family Medicine

## 2018-07-22 DIAGNOSIS — F411 Generalized anxiety disorder: Secondary | ICD-10-CM

## 2018-08-05 ENCOUNTER — Other Ambulatory Visit: Payer: Self-pay | Admitting: Certified Nurse Midwife

## 2018-08-05 DIAGNOSIS — N952 Postmenopausal atrophic vaginitis: Secondary | ICD-10-CM

## 2018-08-05 NOTE — Telephone Encounter (Signed)
Med refill request: Premarin vaginal cream  Last AEX: 01/02/18 DL Next AEX: 01/07/19 DL Last MMG: 07/22/18 birads 1, neg Last filled 06/13/17, 42.5 grams, 1 RF Refill authorized: Dr. Sabra Heck, Please Advise?

## 2018-08-05 NOTE — Telephone Encounter (Signed)
Could you please call pt?  Debbi's note indicates that she uses vaseline only for vulvar dryness and no mention of estrogen cream?  I don't really think we should be prescribing this for an 85 yo female.  Could you please let me know how/why pt is using vaginal estrogen cream.  Thanks.

## 2018-08-07 NOTE — Telephone Encounter (Signed)
Spoke with patient. Patient states that she uses Premarin vaginal cream once a week for vaginal dryness and has been doing so for 15-20 years. States this helps with vaginal dryness and leakage. Reports she has not needed a refill in a year due to only using it once a week. Advised this is not recommended for 85 year old female. Patient states that she has been using it a long time and wants to continue.

## 2018-08-11 NOTE — Telephone Encounter (Signed)
Will call in one refill, she should discuss further at her annual exam. She shouldn't be using it if she doesn't need it. She can use replense vaginal moisturizer or vaginal coconut oil and see if that helps.

## 2018-08-11 NOTE — Telephone Encounter (Signed)
The following message was discussed in detail with patient. Will close encounter.

## 2018-08-21 ENCOUNTER — Other Ambulatory Visit: Payer: Self-pay | Admitting: Family Medicine

## 2018-08-21 DIAGNOSIS — N952 Postmenopausal atrophic vaginitis: Secondary | ICD-10-CM

## 2018-08-21 MED ORDER — ESTROGENS, CONJUGATED 0.625 MG/GM VA CREA: TOPICAL_CREAM | VAGINAL | 2 refills | Status: DC

## 2018-08-21 NOTE — Telephone Encounter (Signed)
What is the name of the medication? Shelly Nelson 0.625 mg OBGYN usually fills it for her but they are not working  Have you contacted your pharmacy to request a refill? NO  Which pharmacy would you like this sent to? [facility]   Patient notified that their request is being sent to the clinical staff for review and that they should receive a call once it is complete. If they do not receive a call within 24 hours they can check with their pharmacy or our office.

## 2018-08-21 NOTE — Telephone Encounter (Signed)
I sent the Premarin cream in for the patient

## 2018-08-21 NOTE — Telephone Encounter (Signed)
Pt aware.

## 2018-10-07 ENCOUNTER — Other Ambulatory Visit: Payer: Self-pay

## 2018-10-07 ENCOUNTER — Ambulatory Visit (INDEPENDENT_AMBULATORY_CARE_PROVIDER_SITE_OTHER): Payer: Medicare Other | Admitting: *Deleted

## 2018-10-07 DIAGNOSIS — Z0000 Encounter for general adult medical examination without abnormal findings: Secondary | ICD-10-CM

## 2018-10-07 NOTE — Progress Notes (Signed)
MEDICARE ANNUAL WELLNESS VISIT  10/07/2018  Telephone Visit Disclaimer This Medicare AWV was conducted by telephone due to national recommendations for restrictions regarding the COVID-19 Pandemic (e.g. social distancing).  I verified, using two identifiers, that I am speaking with Shelly Nelson or their authorized healthcare agent. I discussed the limitations, risks, security, and privacy concerns of performing an evaluation and management service by telephone and the potential availability of an in-person appointment in the future. The patient expressed understanding and agreed to proceed.   Subjective:  Shelly Nelson is a 85 y.o. female patient of Dettinger, Fransisca Kaufmann, MD who had a Medicare Annual Wellness Visit today via telephone. Shelly Nelson is Retired and lives with their spouse. she has 1 child. she reports that she is socially active and does interact with friends/family regularly. she is moderately physically active and enjoys reading.  Patient Care Team: Dettinger, Fransisca Kaufmann, MD as PCP - General (Family Medicine) Bjorn Loser, MD as Attending Physician (Urology) Teena Irani, MD (Inactive) as Attending Physician (Gastroenterology) Joan Flores Lubertha South, MD as Attending Physician (Obstetrics and Gynecology)  Advanced Directives 10/07/2018 06/09/2012 06/04/2012  Does Patient Have a Medical Advance Directive? No Patient does not have advance directive Patient does not have advance directive;Patient would not like information  Would patient like information on creating a medical advance directive? No - Patient declined - -  Pre-existing out of facility DNR order (yellow form or pink MOST form) - - No    Hospital Utilization Over the Past 12 Months: # of hospitalizations or ER visits: 0 # of surgeries: 0  Review of Systems    Patient reports that her overall health is unchanged compared to last year.  Patient Reported Readings (BP, Pulse, CBG, Weight, etc) none   Review of Systems: No complaints  All other systems negative.  Pain Assessment Pain : No/denies pain     Current Medications & Allergies (verified) Allergies as of 10/07/2018      Reactions   Cephalexin Hives      Medication List       Accurate as of Oct 07, 2018  2:39 PM. If you have any questions, ask your nurse or doctor.        STOP taking these medications   naproxen 500 MG tablet Commonly known as:  Naprosyn     TAKE these medications   ALPRAZolam 0.25 MG tablet Commonly known as:  XANAX TAKE 1 TABLET BY MOUTH AT BEDTIME AS NEEDED FOR ANXIETY   atenolol 25 MG tablet Commonly known as:  TENORMIN TAKE 1/2 (ONE-HALF) TABLET BY MOUTH ONCE DAILY   Calcium-D 600-400 MG-UNIT Tabs Take 1 tablet by mouth daily.   conjugated estrogens vaginal cream Commonly known as:  Premarin Use 0.5 grams vaginally 2 x a week at hs.   DULCOLAX 100 MG capsule Generic drug:  docusate sodium Take 100 mg by mouth daily.   Fish Oil 1200 MG Caps Take 1,200 mg by mouth daily.   folic acid 563 MCG tablet Commonly known as:  FOLVITE Take 800 mcg by mouth daily.   Garlic 1497 MG Caps Take 1 capsule by mouth daily.   loratadine 10 MG tablet Commonly known as:  CLARITIN Take 10 mg by mouth daily.   losartan 25 MG tablet Commonly known as:  COZAAR Take 1 tablet (25 mg total) by mouth daily.   magnesium oxide 400 MG tablet Commonly known as:  MAG-OX Take 400 mg by mouth 2 (two) times daily.  multivitamin with minerals Tabs tablet Take 1 tablet by mouth daily.   omeprazole 40 MG capsule Commonly known as:  PRILOSEC Take 1 capsule (40 mg total) by mouth daily.   pravastatin 40 MG tablet Commonly known as:  PRAVACHOL Take 1 tablet (40 mg total) by mouth daily.   psyllium 58.6 % powder Commonly known as:  METAMUCIL Take 1 packet by mouth 2 (two) times daily.   venlafaxine XR 75 MG 24 hr capsule Commonly known as:  EFFEXOR-XR TAKE 1 CAPSULE BY MOUTH ONCE DAILY WITH  BREAKFAST       History (reviewed): Past Medical History:  Diagnosis Date  . Allergy   . Anxiety   . Arthritis   . Basal cell carcinoma   . GERD (gastroesophageal reflux disease) 2012  . Hypercholesteremia 2000  . Hypertension 2000  . Spinal stenosis, lumbar region, without neurogenic claudication 2012  . Squamous cell carcinoma   . Thyroid disease 2013   thyroid nodule with needle aspiration   Past Surgical History:  Procedure Laterality Date  . BACK SURGERY  11/08/2010   lumbar-bulging disc  . CATARACT EXTRACTION     left eye-2011-Dr Hunt  . CATARACT EXTRACTION W/PHACO  06/09/2012   Procedure: CATARACT EXTRACTION PHACO AND INTRAOCULAR LENS PLACEMENT (IOC);  Surgeon: Tonny Branch, MD;  Location: [facility];  Service: Ophthalmology;  Laterality: Left;  CDE=13.91  . skin cancer removal N/A 09/10/13  . THYROID LOBECTOMY  age 55  . TONSILLECTOMY     age 85  . TUBAL LIGATION     Family History  Problem Relation Age of Onset  . Hypertension Mother   . Anxiety disorder Mother   . Breast cancer Mother 12       metasis to lung and bones  . Hypertension Father   . COPD Father   . Hypertension Maternal Grandmother   . Diabetes Maternal Grandmother        type 2  . Hypertension Paternal Grandmother   . Heart disease Paternal Grandmother   . Stroke Maternal Grandfather   . COPD Paternal Grandfather   . Breast cancer Maternal Aunt   . Cancer Maternal Uncle        X 5 with various cancers   Social History   Socioeconomic History  . Marital status: Married    Spouse name: Gwyndolyn Saxon  . Number of children: 1  . Years of education: Not on file  . Highest education level: High school graduate  Occupational History    Employer: RETIRED  Social Needs  . Financial resource strain: Not hard at all  . Food insecurity:    Worry: Never true    Inability: Never true  . Transportation needs:    Medical: No    Non-medical: No  Tobacco Use  . Smoking status: Never Smoker  . Smokeless  tobacco: Never Used  Substance and Sexual Activity  . Alcohol use: No  . Drug use: No  . Sexual activity: Not Currently    Birth control/protection: Post-menopausal, Surgical    Comment: BTL  Lifestyle  . Physical activity:    Days per week: 5 days    Minutes per session: 40 min  . Stress: Only a little  Relationships  . Social connections:    Talks on phone: Three times a week    Gets together: Three times a week    Attends religious service: More than 4 times per year    Active member of club or organization: Yes    Attends  meetings of clubs or organizations: More than 4 times per year    Relationship status: Married  Other Topics Concern  . Not on file  Social History Narrative  . Not on file    Activities of Daily Living In your present state of health, do you have any difficulty performing the following activities: 10/07/2018  Hearing? N  Vision? N  Difficulty concentrating or making decisions? N  Walking or climbing stairs? N  Dressing or bathing? N  Doing errands, shopping? N  Preparing Food and eating ? N  Using the Toilet? N  In the past six months, have you accidently leaked urine? Y  Comment pt has had slight leaking of urine for years but hasn't gotten any worse  Do you have problems with loss of bowel control? N  Managing your Medications? N  Managing your Finances? N  Housekeeping or managing your Housekeeping? N  Some recent data might be hidden    Patient Literacy How often do you need to have someone help you when you read instructions, pamphlets, or other written materials from your doctor or pharmacy?: 1 - Never What is the last grade level you completed in school?: 12th grade  Exercise Current Exercise Habits: Home exercise routine, Type of exercise: walking, Time (Minutes): 40, Frequency (Times/Week): 5, Weekly Exercise (Minutes/Week): 200, Intensity: Moderate, Exercise limited by: None identified  Diet Patient reports consuming 3 meals a day  and 2 snack(s) a day Patient reports that her primary diet is: Regular Patient reports that she does have regular access to food.   Depression Screen PHQ 2/9 Scores 10/07/2018 05/12/2018 11/08/2017 05/10/2017 02/08/2017 11/07/2016 10/10/2016  PHQ - 2 Score 0 0 0 0 0 0 0     Fall Risk Fall Risk  10/07/2018 05/12/2018 11/08/2017 05/10/2017 02/08/2017  Falls in the past year? 1 0 No No No  Number falls in past yr: 1 - - - -  Injury with Fall? 0 - - - -  Comment - - - - -     Objective:  Shelly Nelson seemed alert and oriented and she participated appropriately during our telephone visit.  Blood Pressure Weight BMI  BP Readings from Last 3 Encounters:  05/12/18 (!) 147/74  01/02/18 120/60  11/08/17 132/78   Wt Readings from Last 3 Encounters:  05/12/18 141 lb 6.4 oz (64.1 kg)  01/02/18 136 lb (61.7 kg)  11/08/17 135 lb (61.2 kg)   BMI Readings from Last 1 Encounters:  05/12/18 24.46 kg/m    *Unable to obtain current vital signs, weight, and BMI due to telephone visit type  Hearing/Vision  . Shelly Nelson did not seem to have difficulty with hearing/understanding during the telephone conversation . Reports that she has not had a formal eye exam by an eye care professional within the past year . Reports that she has not had a formal hearing evaluation within the past year *Unable to fully assess hearing and vision during telephone visit type  Cognitive Function: 6CIT Screen 10/07/2018  What Year? 0 points  What month? 0 points  What time? 0 points  Count back from 20 0 points  Months in reverse 0 points  Repeat phrase 0 points  Total Score 0    Normal Cognitive Function Screening: Yes (Normal:0-7, Significant for Dysfunction: >8)  Immunization & Health Maintenance Record Immunization History  Administered Date(s) Administered  . Influenza, High Dose Seasonal PF 02/08/2017, 02/20/2018  . Influenza,inj,Quad PF,6+ Mos 02/10/2013, 02/18/2014, 02/16/2015, 02/02/2016  .  Pneumococcal Conjugate-13  02/18/2014  . Pneumococcal Polysaccharide-23 05/14/2005  . Tdap 09/10/2012  . Zoster 02/12/2008    Health Maintenance  Topic Date Due  . INFLUENZA VACCINE  12/13/2018  . MAMMOGRAM  07/21/2019  . TETANUS/TDAP  09/11/2022  . DEXA SCAN  Completed  . PNA vac Low Risk Adult  Completed       Assessment  This is a routine wellness examination for Shelly Nelson.  Health Maintenance: Due or Overdue There are no preventive care reminders to display for this patient.  Shelly Nelson does not need a referral for Community Assistance: Care Management:   no Social Work:    no Prescription Assistance:  no Nutrition/Diabetes Education:  no   Plan:  Personalized Goals Goals Addressed            This Visit's Progress   . Patient Stated (pt-stated)       Pt states she is trying to limit snacks between meals and to lose a "few pounds"      Personalized Health Maintenance & Screening Recommendations  Pt is up to date on all Health Maintenance and Screenings  Lung Cancer Screening Recommended: no (Low Dose CT Chest recommended if Age 65-80 years, 30 pack-year currently smoking OR have quit w/in past 15 years) Hepatitis C Screening recommended: no HIV Screening recommended: no  Advanced Directives: Written information was not prepared per patient's request.  Referrals & Orders No orders of the defined types were placed in this encounter.   Follow-up Plan . Follow-up with Dettinger, Fransisca Kaufmann, MD as planned     I have personally reviewed and noted the following in the patient's chart:   . Medical and social history . Use of alcohol, tobacco or illicit drugs  . Current medications and supplements . Functional ability and status . Nutritional status . Physical activity . Advanced directives . List of other physicians . Hospitalizations, surgeries, and ER visits in previous 12 months . Vitals . Screenings to include cognitive,  depression, and falls . Referrals and appointments  In addition, I have reviewed and discussed with Shelly Nelson certain preventive protocols, quality metrics, and best practice recommendations. A written personalized care plan for preventive services as well as general preventive health recommendations is available and can be mailed to the patient at her request.      Marylin Crosby  10/07/2018

## 2018-10-07 NOTE — Patient Instructions (Signed)
Preventive Care 83 Years and Older, Female Preventive care refers to lifestyle choices and visits with your health care provider that can promote health and wellness. What does preventive care include?  A yearly physical exam. This is also called an annual well check.  Dental exams once or twice a year.  Routine eye exams. Ask your health care provider how often you should have your eyes checked.  Personal lifestyle choices, including: ? Daily care of your teeth and gums. ? Regular physical activity. ? Eating a healthy diet. ? Avoiding tobacco and drug use. ? Limiting alcohol use. ? Practicing safe sex. ? Taking low-dose aspirin every day. ? Taking vitamin and mineral supplements as recommended by your health care provider. What happens during an annual well check? The services and screenings done by your health care provider during your annual well check will depend on your age, overall health, lifestyle risk factors, and family history of disease. Counseling Your health care provider may ask you questions about your:  Alcohol use.  Tobacco use.  Drug use.  Emotional well-being.  Home and relationship well-being.  Sexual activity.  Eating habits.  History of falls.  Memory and ability to understand (cognition).  Work and work Statistician.  Reproductive health.  Screening You may have the following tests or measurements:  Height, weight, and BMI.  Blood pressure.  Lipid and cholesterol levels. These may be checked every 5 years, or more frequently if you are over 83 years old.  Skin check.  Lung cancer screening. You may have this screening every year starting at age 83 if you have a 30-pack-year history of smoking and currently smoke or have quit within the past 15 years.  Colorectal cancer screening. All adults should have this screening starting at age 83 and continuing until age 83. You will have tests every 1-10 years, depending on your results and the  type of screening test. People at increased risk should start screening at an earlier age. Screening tests may include: ? Guaiac-based fecal occult blood testing. ? Fecal immunochemical test (FIT). ? Stool DNA test. ? Virtual colonoscopy. ? Sigmoidoscopy. During this test, a flexible tube with a tiny camera (sigmoidoscope) is used to examine your rectum and lower colon. The sigmoidoscope is inserted through your anus into your rectum and lower colon. ? Colonoscopy. During this test, a long, thin, flexible tube with a tiny camera (colonoscope) is used to examine your entire colon and rectum.  Hepatitis C blood test.  Hepatitis B blood test.  Sexually transmitted disease (STD) testing.  Diabetes screening. This is done by checking your blood sugar (glucose) after you have not eaten for a while (fasting). You may have this done every 1-3 years.  Bone density scan. This is done to screen for osteoporosis. You may have this done starting at age 83.  Mammogram. This may be done every 1-2 years. Talk to your health care provider about how often you should have regular mammograms. Talk with your health care provider about your test results, treatment options, and if necessary, the need for more tests. Vaccines Your health care provider may recommend certain vaccines, such as:  Influenza vaccine. This is recommended every year.  Tetanus, diphtheria, and acellular pertussis (Tdap, Td) vaccine. You may need a Td booster every 10 years.  Varicella vaccine. You may need this if you have not been vaccinated.  Zoster vaccine. You may need this after age 83.  Measles, mumps, and rubella (MMR) vaccine. You may need at least  one dose of MMR if you were born in 1957 or later. You may also need a second dose.  Pneumococcal 13-valent conjugate (PCV13) vaccine. One dose is recommended after age 83.  Pneumococcal polysaccharide (PPSV23) vaccine. One dose is recommended after age 83.  Meningococcal  vaccine. You may need this if you have certain conditions.  Hepatitis A vaccine. You may need this if you have certain conditions or if you travel or work in places where you may be exposed to hepatitis A.  Hepatitis B vaccine. You may need this if you have certain conditions or if you travel or work in places where you may be exposed to hepatitis B.  Haemophilus influenzae type b (Hib) vaccine. You may need this if you have certain conditions. Talk to your health care provider about which screenings and vaccines you need and how often you need them. This information is not intended to replace advice given to you by your health care provider. Make sure you discuss any questions you have with your health care provider. Document Released: 05/27/2015 Document Revised: 06/20/2017 Document Reviewed: 03/01/2015 Elsevier Interactive Patient Education  2019 [employer].

## 2018-11-12 ENCOUNTER — Encounter: Payer: Self-pay | Admitting: Family Medicine

## 2018-11-12 ENCOUNTER — Ambulatory Visit (INDEPENDENT_AMBULATORY_CARE_PROVIDER_SITE_OTHER): Payer: Medicare Other | Admitting: Family Medicine

## 2018-11-12 DIAGNOSIS — E7800 Pure hypercholesterolemia, unspecified: Secondary | ICD-10-CM

## 2018-11-12 DIAGNOSIS — K219 Gastro-esophageal reflux disease without esophagitis: Secondary | ICD-10-CM

## 2018-11-12 DIAGNOSIS — I10 Essential (primary) hypertension: Secondary | ICD-10-CM

## 2018-11-12 DIAGNOSIS — E039 Hypothyroidism, unspecified: Secondary | ICD-10-CM

## 2018-11-12 DIAGNOSIS — F419 Anxiety disorder, unspecified: Secondary | ICD-10-CM

## 2018-11-12 MED ORDER — PRAVASTATIN SODIUM 40 MG PO TABS: 40.0000 mg | ORAL_TABLET | Freq: Every day | ORAL | 3 refills | Status: DC

## 2018-11-12 MED ORDER — OMEPRAZOLE 40 MG PO CPDR: 40.0000 mg | DELAYED_RELEASE_CAPSULE | Freq: Every day | ORAL | 3 refills | Status: DC

## 2018-11-12 MED ORDER — ATENOLOL 25 MG PO TABS: ORAL_TABLET | ORAL | 3 refills | Status: DC

## 2018-11-12 MED ORDER — LOSARTAN POTASSIUM 25 MG PO TABS: 25.0000 mg | ORAL_TABLET | Freq: Every day | ORAL | 3 refills | Status: DC

## 2018-11-12 MED ORDER — VENLAFAXINE HCL ER 75 MG PO CP24: ORAL_CAPSULE | ORAL | 3 refills | Status: DC

## 2018-11-12 NOTE — Progress Notes (Signed)
Virtual Visit via telephone Note  I connected with Shelly Nelson on 11/12/18 at [place] by telephone and verified that I am speaking with the correct person using two identifiers. Shelly Nelson is currently located at home and no other people are currently with her during visit. The provider, Fransisca Kaufmann Yeny Schmoll, MD is located in their office at time of visit.  Call ended at [phone]  I discussed the limitations, risks, security and privacy concerns of performing an evaluation and management service by telephone and the availability of in person appointments. I also discussed with the patient that there may be a patient responsible charge related to this service. The patient expressed understanding and agreed to proceed.   History and Present Illness: Hypertension Patient is currently on atenolol and losartan, and their blood pressure today is 140/65 am, 107/52 at PM . Patient denies any lightheadedness or dizziness. Patient denies headaches, blurred vision, chest pains, shortness of breath, or weakness. Denies any side effects from medication and is content with current medication.   Hyperlipidemia Patient is coming in for recheck of his hyperlipidemia. The patient is currently taking pravastatin. They deny any issues with myalgias or history of liver damage from it. They deny any focal numbness or weakness or chest pain.   GERD Patient is currently on omeprazole.  She denies any major symptoms or abdominal pain or belching or burping. She denies any blood in her stool or lightheadedness or dizziness.   Anxiety   No diagnosis found.  Outpatient Encounter Medications as of 11/12/2018  Medication Sig  . ALPRAZolam (XANAX) 0.25 MG tablet TAKE 1 TABLET BY MOUTH AT BEDTIME AS NEEDED FOR ANXIETY  . atenolol (TENORMIN) 25 MG tablet TAKE 1/2 (ONE-HALF) TABLET BY MOUTH ONCE DAILY  . Calcium Carbonate-Vitamin D (CALCIUM-D) 600-400 MG-UNIT TABS Take 1 tablet by mouth daily.  Marland Kitchen conjugated  estrogens (PREMARIN) vaginal cream Use 0.5 grams vaginally 2 x a week at hs.  . docusate sodium (DULCOLAX) 100 MG capsule Take 100 mg by mouth daily.  . folic acid (FOLVITE) 027 MCG tablet Take 800 mcg by mouth daily.  . Garlic 2536 MG CAPS Take 1 capsule by mouth daily.  Marland Kitchen loratadine (CLARITIN) 10 MG tablet Take 10 mg by mouth daily.  Marland Kitchen losartan (COZAAR) 25 MG tablet Take 1 tablet (25 mg total) by mouth daily.  . magnesium oxide (MAG-OX) 400 MG tablet Take 400 mg by mouth 2 (two) times daily.  . Multiple Vitamin (MULTIVITAMIN WITH MINERALS) TABS Take 1 tablet by mouth daily.  . Omega-3 Fatty Acids (FISH OIL) 1200 MG CAPS Take 1,200 mg by mouth daily.  Marland Kitchen omeprazole (PRILOSEC) 40 MG capsule Take 1 capsule (40 mg total) by mouth daily.  . pravastatin (PRAVACHOL) 40 MG tablet Take 1 tablet (40 mg total) by mouth daily.  . psyllium (METAMUCIL) 58.6 % powder Take 1 packet by mouth 2 (two) times daily.  Marland Kitchen venlafaxine XR (EFFEXOR-XR) 75 MG 24 hr capsule TAKE 1 CAPSULE BY MOUTH ONCE DAILY WITH BREAKFAST   No facility-administered encounter medications on file as of 11/12/2018.     Review of Systems  Constitutional: Negative for chills and fever.  Eyes: Negative for visual disturbance.  Respiratory: Negative for chest tightness and shortness of breath.   Cardiovascular: Negative for chest pain and leg swelling.  Musculoskeletal: Negative for back pain and gait problem.  Skin: Negative for rash.  Neurological: Negative for dizziness, light-headedness and headaches.  Psychiatric/Behavioral: Negative for agitation, behavioral problems, dysphoric mood,  self-injury, sleep disturbance and suicidal ideas. The patient is not nervous/anxious.   All other systems reviewed and are negative.   Observations/Objective: Patient sounds comfortable and in no distress  Assessment and Plan: Problem List Items Addressed This Visit      Cardiovascular and Mediastinum   HTN (hypertension)   Relevant Medications    losartan (COZAAR) 25 MG tablet   atenolol (TENORMIN) 25 MG tablet   pravastatin (PRAVACHOL) 40 MG tablet     Digestive   GERD (gastroesophageal reflux disease) - Primary   Relevant Medications   omeprazole (PRILOSEC) 40 MG capsule     Endocrine   Hypothyroid   Relevant Medications   atenolol (TENORMIN) 25 MG tablet     Other   HLD (hyperlipidemia)   Relevant Medications   losartan (COZAAR) 25 MG tablet   atenolol (TENORMIN) 25 MG tablet   pravastatin (PRAVACHOL) 40 MG tablet   Anxiety   Relevant Medications   venlafaxine XR (EFFEXOR-XR) 75 MG 24 hr capsule       Follow Up Instructions: Follow-up in 5 to 6 months, sometime in December to get blood work and an in person recheck. Continue losartan and atenolol and pravastatin and omeprazole and Effexor for the patient.  She seems to be doing well    I discussed the assessment and treatment plan with the patient. The patient was provided an opportunity to ask questions and all were answered. The patient agreed with the plan and demonstrated an understanding of the instructions.   The patient was advised to call back or seek an in-person evaluation if the symptoms worsen or if the condition fails to improve as anticipated.  The above assessment and management plan was discussed with the patient. The patient verbalized understanding of and has agreed to the management plan. Patient is aware to call the clinic if symptoms persist or worsen. Patient is aware when to return to the clinic for a follow-up visit. Patient educated on when it is appropriate to go to the emergency department.    I provided 13 minutes of non-face-to-face time during this encounter.    Worthy Rancher, MD

## 2018-12-09 DIAGNOSIS — B9689 Other specified bacterial agents as the cause of diseases classified elsewhere: Secondary | ICD-10-CM | POA: Diagnosis not present

## 2018-12-09 DIAGNOSIS — D225 Melanocytic nevi of trunk: Secondary | ICD-10-CM | POA: Diagnosis not present

## 2018-12-09 DIAGNOSIS — L0202 Furuncle of face: Secondary | ICD-10-CM | POA: Diagnosis not present

## 2018-12-09 DIAGNOSIS — L820 Inflamed seborrheic keratosis: Secondary | ICD-10-CM | POA: Diagnosis not present

## 2018-12-09 DIAGNOSIS — C44319 Basal cell carcinoma of skin of other parts of face: Secondary | ICD-10-CM | POA: Diagnosis not present

## 2018-12-09 DIAGNOSIS — L570 Actinic keratosis: Secondary | ICD-10-CM | POA: Diagnosis not present

## 2018-12-09 DIAGNOSIS — X32XXXD Exposure to sunlight, subsequent encounter: Secondary | ICD-10-CM | POA: Diagnosis not present

## 2019-01-07 ENCOUNTER — Encounter: Payer: Self-pay | Admitting: Family Medicine

## 2019-01-07 ENCOUNTER — Ambulatory Visit (INDEPENDENT_AMBULATORY_CARE_PROVIDER_SITE_OTHER): Payer: Medicare Other | Admitting: Family Medicine

## 2019-01-07 ENCOUNTER — Other Ambulatory Visit: Payer: Self-pay

## 2019-01-07 ENCOUNTER — Ambulatory Visit: Payer: Medicare Other | Admitting: Certified Nurse Midwife

## 2019-01-07 VITALS — BP 156/90 | HR 83 | Temp 99.3°F | Ht 63.0 in | Wt 146.0 lb

## 2019-01-07 DIAGNOSIS — M7062 Trochanteric bursitis, left hip: Secondary | ICD-10-CM

## 2019-01-07 MED ORDER — PREDNISONE 10 MG PO TABS: ORAL_TABLET | ORAL | 0 refills | Status: DC

## 2019-01-07 NOTE — Progress Notes (Addendum)
Chief Complaint  Patient presents with  . Hip Pain    left  3 weeks  . Shoulder Pain    right    HPI  Patient presents today for left hip pain. NKI. Bent over 2 weeks ago. When she straightened up, she felt pain in the left inguinal regio on laterally into the greater trochanteric region. Moderate. Worse with bending, twisting. Increasing over time.  PMH: Smoking status noted ROS: Per HPI  Objective: BP (!) 156/90   Pulse 83   Temp 99.3 F (37.4 C) (Oral)   Ht 5\' 3"  (1.6 m)   Wt 146 lb (66.2 kg)   LMP 05/14/2001 (Approximate)   BMI 25.86 kg/m  Gen: NAD, alert, cooperative with exam HEENT: NCAT, EOMI, PERRL CV: RRR, good S1/S2, no murmur Resp: CTABL, no wheezes, non-labored Ext: No edema, warm. Tender at left inginal ligament and over Left trochanteric bursa. Neuro: Alert and oriented, No gross deficits  Assessment and plan:  1. Trochanteric bursitis of left hip     Meds ordered this encounter  Medications  . predniSONE (DELTASONE) 10 MG tablet    Sig: Take 5 daily for 3 days followed by 4,3,2 and 1 for 3 days each.    Dispense:  45 tablet    Refill:  0    No orders of the defined types were placed in this encounter.   Follow up as needed.  Claretta Fraise, MD

## 2019-01-07 NOTE — Patient Instructions (Signed)
Apply heat to affected area for 15 minutes 4-5 times a day

## 2019-01-22 DIAGNOSIS — L738 Other specified follicular disorders: Secondary | ICD-10-CM | POA: Diagnosis not present

## 2019-01-22 DIAGNOSIS — Z85828 Personal history of other malignant neoplasm of skin: Secondary | ICD-10-CM | POA: Diagnosis not present

## 2019-01-22 DIAGNOSIS — L820 Inflamed seborrheic keratosis: Secondary | ICD-10-CM | POA: Diagnosis not present

## 2019-01-22 DIAGNOSIS — Z08 Encounter for follow-up examination after completed treatment for malignant neoplasm: Secondary | ICD-10-CM | POA: Diagnosis not present

## 2019-01-27 ENCOUNTER — Other Ambulatory Visit: Payer: Self-pay | Admitting: Family Medicine

## 2019-01-27 ENCOUNTER — Ambulatory Visit (INDEPENDENT_AMBULATORY_CARE_PROVIDER_SITE_OTHER): Payer: Medicare Other

## 2019-01-27 ENCOUNTER — Encounter: Payer: Self-pay | Admitting: Family Medicine

## 2019-01-27 ENCOUNTER — Other Ambulatory Visit: Payer: Self-pay

## 2019-01-27 ENCOUNTER — Ambulatory Visit (INDEPENDENT_AMBULATORY_CARE_PROVIDER_SITE_OTHER): Payer: Medicare Other | Admitting: Family Medicine

## 2019-01-27 DIAGNOSIS — M7062 Trochanteric bursitis, left hip: Secondary | ICD-10-CM | POA: Diagnosis not present

## 2019-01-27 DIAGNOSIS — M1612 Unilateral primary osteoarthritis, left hip: Secondary | ICD-10-CM

## 2019-01-27 DIAGNOSIS — M1712 Unilateral primary osteoarthritis, left knee: Secondary | ICD-10-CM | POA: Diagnosis not present

## 2019-01-27 MED ORDER — CELECOXIB 200 MG PO CAPS: 200.0000 mg | ORAL_CAPSULE | Freq: Every day | ORAL | 5 refills | Status: DC

## 2019-01-27 NOTE — Progress Notes (Signed)
Subjective:    Patient ID: Shelly Nelson, female    DOB: [birth year], 85 y.o.   MRN: [id number]   HPI: Shelly Nelson is a 85 y.o. female presenting for pain radiating from left hip to knee. Some improvement with prednisone. Can't walk on it Best when she first gets up. Hurts to fix a meal - standing, wlking around the kitchen.    Depression screen [facility] 2/9 01/07/2019 10/07/2018 05/12/2018 11/08/2017 05/10/2017  Decreased Interest 0 0 0 0 0  Down, Depressed, Hopeless 0 0 0 0 0  PHQ - 2 Score 0 0 0 0 0     Relevant past medical, surgical, family and social history reviewed and updated as indicated.  Interim medical history since our last visit reviewed. Allergies and medications reviewed and updated.  ROS:  Review of Systems  Constitutional: Negative.   HENT: Negative for congestion.   Eyes: Negative for visual disturbance.  Respiratory: Negative for shortness of breath.   Cardiovascular: Negative for chest pain.  Gastrointestinal: Negative for abdominal pain, constipation, diarrhea, nausea and vomiting.  Genitourinary: Negative for difficulty urinating.  Musculoskeletal: Positive for arthralgias and myalgias.  Neurological: Negative for headaches.  Psychiatric/Behavioral: Negative for sleep disturbance.     Social History   Tobacco Use  Smoking Status Never Smoker  Smokeless Tobacco Never Used       Objective:     Wt Readings from Last 3 Encounters:  01/07/19 146 lb (66.2 kg)  05/12/18 141 lb 6.4 oz (64.1 kg)  01/02/18 136 lb (61.7 kg)     Exam deferred. Pt. Harboring due to COVID 19. Phone visit performed.   XR- arthritis changes in left hip. No acute abnormality noted.   Assessment & Plan:   1. Arthritis of left hip     Meds ordered this encounter  Medications  . celecoxib (CELEBREX) 200 MG capsule    Sig: Take 1 capsule (200 mg total) by mouth daily. With food    Dispense:  30 capsule    Refill:  5    Orders Placed This Encounter   Procedures  . Ambulatory referral to Orthopedics    Referral Priority:   Routine    Referral Type:   Consultation    Referred to Provider:   Latanya Maudlin, MD    Number of Visits Requested:   1      Diagnoses and all orders for this visit:  Arthritis of left hip -     Cancel: DG Knee 1-2 Views Left; Future -     Ambulatory referral to Orthopedics  Other orders -     celecoxib (CELEBREX) 200 MG capsule; Take 1 capsule (200 mg total) by mouth daily. With food    Virtual Visit via telephone Note  I discussed the limitations, risks, security and privacy concerns of performing an evaluation and management service by telephone and the availability of in person appointments. The patient was identified with two identifiers. Pt.expressed understanding and agreed to proceed. Pt. Is at home. Dr. Livia Snellen is in his office.  Follow Up Instructions:   I discussed the assessment and treatment plan with the patient. The patient was provided an opportunity to ask questions and all were answered. The patient agreed with the plan and demonstrated an understanding of the instructions.   The patient was advised to call back or seek an in-person evaluation if the symptoms worsen or if the condition fails to improve as anticipated.   Total minutes including chart review and  phone contact time: 22   Follow up plan: Return if symptoms worsen or fail to improve and for ususal care with Dr. Warrick Parisian.Claretta Fraise, MD [place]

## 2019-02-02 ENCOUNTER — Ambulatory Visit: Payer: Medicare Other | Admitting: Family Medicine

## 2019-02-03 ENCOUNTER — Encounter: Payer: Self-pay | Admitting: Family Medicine

## 2019-02-04 ENCOUNTER — Encounter: Payer: Self-pay | Admitting: Family Medicine

## 2019-02-05 ENCOUNTER — Encounter: Payer: Self-pay | Admitting: Family Medicine

## 2019-02-13 DIAGNOSIS — M25552 Pain in left hip: Secondary | ICD-10-CM | POA: Diagnosis not present

## 2019-02-13 DIAGNOSIS — M25562 Pain in left knee: Secondary | ICD-10-CM | POA: Diagnosis not present

## 2019-02-20 DIAGNOSIS — E042 Nontoxic multinodular goiter: Secondary | ICD-10-CM | POA: Diagnosis not present

## 2019-02-20 DIAGNOSIS — I10 Essential (primary) hypertension: Secondary | ICD-10-CM | POA: Diagnosis not present

## 2019-02-26 DIAGNOSIS — M25562 Pain in left knee: Secondary | ICD-10-CM | POA: Diagnosis not present

## 2019-02-26 DIAGNOSIS — M5416 Radiculopathy, lumbar region: Secondary | ICD-10-CM | POA: Diagnosis not present

## 2019-02-27 ENCOUNTER — Ambulatory Visit (INDEPENDENT_AMBULATORY_CARE_PROVIDER_SITE_OTHER): Payer: Medicare Other

## 2019-02-27 ENCOUNTER — Other Ambulatory Visit: Payer: Self-pay

## 2019-02-27 DIAGNOSIS — Z23 Encounter for immunization: Secondary | ICD-10-CM

## 2019-04-16 ENCOUNTER — Other Ambulatory Visit: Payer: Self-pay

## 2019-04-17 ENCOUNTER — Ambulatory Visit (INDEPENDENT_AMBULATORY_CARE_PROVIDER_SITE_OTHER): Payer: Medicare Other | Admitting: Family Medicine

## 2019-04-17 ENCOUNTER — Encounter: Payer: Self-pay | Admitting: Family Medicine

## 2019-04-17 VITALS — BP 153/83 | HR 81 | Temp 96.8°F | Ht 63.0 in | Wt 146.8 lb

## 2019-04-17 DIAGNOSIS — E7800 Pure hypercholesterolemia, unspecified: Secondary | ICD-10-CM | POA: Diagnosis not present

## 2019-04-17 DIAGNOSIS — I10 Essential (primary) hypertension: Secondary | ICD-10-CM

## 2019-04-17 DIAGNOSIS — F411 Generalized anxiety disorder: Secondary | ICD-10-CM

## 2019-04-17 DIAGNOSIS — K219 Gastro-esophageal reflux disease without esophagitis: Secondary | ICD-10-CM | POA: Diagnosis not present

## 2019-04-17 DIAGNOSIS — E039 Hypothyroidism, unspecified: Secondary | ICD-10-CM | POA: Diagnosis not present

## 2019-04-17 MED ORDER — ALPRAZOLAM 0.25 MG PO TABS: 0.2500 mg | ORAL_TABLET | Freq: Every evening | ORAL | 1 refills | Status: DC | PRN

## 2019-04-17 NOTE — Progress Notes (Signed)
BP (!) 153/83   Pulse 81   Temp (!) 96.8 F (36 C) (Temporal)   Ht 5\' 3"  (1.6 m)   Wt 146 lb 12.8 oz (66.6 kg)   LMP 05/14/2001 (Approximate)   SpO2 99%   BMI 26.00 kg/m    Subjective:   Patient ID: Shelly Nelson, female    DOB: Nov 23, 1929, 85 y.o.   MRN: [id number]  HPI: Shelly Nelson is a 85 y.o. female presenting on 04/17/2019 for Hypertension (6 month follow up) and Hypothyroidism   HPI Hypertension Patient is currently on losartan, and their blood pressure today is 153/83. Patient denies any lightheadedness or dizziness. Patient denies headaches, blurred vision, chest pains, shortness of breath, or weakness. Denies any side effects from medication and is content with current medication.   Hyperlipidemia Patient is coming in for recheck of his hyperlipidemia. The patient is currently taking pravastatin and fish oil. They deny any issues with myalgias or history of liver damage from it. They deny any focal numbness or weakness or chest pain.   GERD Patient is currently on omeprazole.  She denies any major symptoms or abdominal pain or belching or burping. She denies any blood in her stool or lightheadedness or dizziness.   Hypothyroidism recheck Patient is coming in for thyroid recheck today as well. They deny any issues with hair changes or heat or cold problems or diarrhea or constipation. They deny any chest pain or palpitations. They are currently on no medication but has been borderline and we are monitoring  Patient uses anxiety and depression Current rx-alprazolam 0.25 nightly as needed # meds rx- 30 Effectiveness of current meds-works well Adverse reactions form meds-none  Pill count performed-No Last drug screen -N/A, only uses infrequently ( high risk q72m, moderate risk q4m, low risk yearly ) Urine drug screen today- No Was the [place] reviewed-yes  If yes were their any concerning findings? -None  No flowsheet data found.   Controlled  substance contract signed on: N/A  Relevant past medical, surgical, family and social history reviewed and updated as indicated. Interim medical history since our last visit reviewed. Allergies and medications reviewed and updated.  Review of Systems  Constitutional: Negative for chills and fever.  Eyes: Negative for visual disturbance.  Respiratory: Negative for chest tightness and shortness of breath.   Cardiovascular: Negative for chest pain and leg swelling.  Musculoskeletal: Negative for back pain and gait problem.  Skin: Negative for rash.  Neurological: Negative for light-headedness and headaches.  Psychiatric/Behavioral: Negative for agitation, behavioral problems, dysphoric mood, self-injury, sleep disturbance and suicidal ideas. The patient is not nervous/anxious.   All other systems reviewed and are negative.   Per HPI unless specifically indicated above   Allergies as of 04/17/2019      Reactions   Cephalexin Hives      Medication List       Accurate as of April 17, 2019  8:27 AM. If you have any questions, ask your nurse or doctor.        ALPRAZolam 0.25 MG tablet Commonly known as: XANAX TAKE 1 TABLET BY MOUTH AT BEDTIME AS NEEDED FOR ANXIETY   atenolol 25 MG tablet Commonly known as: TENORMIN TAKE 1/2 (ONE-HALF) TABLET BY MOUTH ONCE DAILY   Calcium-D 600-400 MG-UNIT Tabs Take 1 tablet by mouth daily.   celecoxib 200 MG capsule Commonly known as: CeleBREX Take 1 capsule (200 mg total) by mouth daily. With food   conjugated estrogens vaginal cream Commonly known as:  Premarin Use 0.5 grams vaginally 2 x a week at hs.   DULCOLAX 100 MG capsule Generic drug: docusate sodium Take 100 mg by mouth daily.   Fish Oil 1200 MG Caps Take 1,200 mg by mouth daily.   folic acid Q000111Q MCG tablet Commonly known as: FOLVITE Take 800 mcg by mouth daily.   Garlic 123XX123 MG Caps Take 1 capsule by mouth daily.   loratadine 10 MG tablet Commonly known as:  CLARITIN Take 10 mg by mouth daily.   losartan 25 MG tablet Commonly known as: COZAAR Take 1 tablet (25 mg total) by mouth daily.   magnesium oxide 400 MG tablet Commonly known as: MAG-OX Take 400 mg by mouth 2 (two) times daily.   multivitamin with minerals Tabs tablet Take 1 tablet by mouth daily.   omeprazole 40 MG capsule Commonly known as: PRILOSEC Take 1 capsule (40 mg total) by mouth daily.   pravastatin 40 MG tablet Commonly known as: PRAVACHOL Take 1 tablet (40 mg total) by mouth daily.   psyllium 58.6 % powder Commonly known as: METAMUCIL Take 1 packet by mouth 2 (two) times daily.   venlafaxine XR 75 MG 24 hr capsule Commonly known as: EFFEXOR-XR TAKE 1 CAPSULE BY MOUTH ONCE DAILY WITH BREAKFAST        Objective:   BP (!) 153/83   Pulse 81   Temp (!) 96.8 F (36 C) (Temporal)   Ht 5\' 3"  (1.6 m)   Wt 146 lb 12.8 oz (66.6 kg)   LMP 05/14/2001 (Approximate)   SpO2 99%   BMI 26.00 kg/m   Wt Readings from Last 3 Encounters:  04/17/19 146 lb 12.8 oz (66.6 kg)  01/07/19 146 lb (66.2 kg)  05/12/18 141 lb 6.4 oz (64.1 kg)    Physical Exam Vitals signs and nursing note reviewed.  Constitutional:      General: She is not in acute distress.    Appearance: She is well-developed. She is not diaphoretic.  Eyes:     Conjunctiva/sclera: Conjunctivae normal.  Cardiovascular:     Rate and Rhythm: Normal rate and regular rhythm.     Heart sounds: Normal heart sounds. No murmur.  Pulmonary:     Effort: Pulmonary effort is normal. No respiratory distress.     Breath sounds: Normal breath sounds. No wheezing.  Skin:    General: Skin is warm and dry.     Findings: No rash.  Neurological:     Mental Status: She is alert and oriented to person, place, and time.     Coordination: Coordination normal.  Psychiatric:        Behavior: Behavior normal.      Assessment & Plan:   Problem List Items Addressed This Visit      Cardiovascular and Mediastinum    HTN (hypertension)   White coat syndrome with hypertension - Primary     Digestive   GERD (gastroesophageal reflux disease)     Endocrine   Hypothyroid     Other   HLD (hyperlipidemia)    Other Visit Diagnoses    Generalized anxiety disorder       Relevant Medications   ALPRAZolam (XANAX) 0.25 MG tablet       Continue current medication, allowing permissive hypertension.  She only gets Xanax once or twice a year, 30 tablets, we are okay with her continuing this. Follow up plan: Return in about 3 months (around 07/16/2019), or if symptoms worsen or fail to improve, for Hypertension and thyroid recheck.  Counseling provided for all of the vaccine components No orders of the defined types were placed in this encounter.   Caryl Pina, MD [facility] 04/17/2019, 8:27 AM

## 2019-04-18 LAB — CMP14+EGFR
ALT: 27 IU/L (ref 0–32)
AST: 45 IU/L — ABNORMAL HIGH (ref 0–40)
Albumin/Globulin Ratio: 2.3 — ABNORMAL HIGH (ref 1.2–2.2)
Albumin: 4.3 g/dL (ref 3.6–4.6)
Alkaline Phosphatase: 69 IU/L (ref 39–117)
BUN/Creatinine Ratio: 10 — ABNORMAL LOW (ref 12–28)
BUN: 11 mg/dL (ref 8–27)
Bilirubin Total: 0.3 mg/dL (ref 0.0–1.2)
CO2: 26 mmol/L (ref 20–29)
Calcium: 9.6 mg/dL (ref 8.7–10.3)
Chloride: 101 mmol/L (ref 96–106)
Creatinine, Ser: 1.05 mg/dL — ABNORMAL HIGH (ref 0.57–1.00)
GFR calc Af Amer: 55 mL/min/{1.73_m2} — ABNORMAL LOW (ref >59)
GFR calc non Af Amer: 48 mL/min/{1.73_m2} — ABNORMAL LOW (ref >59)
Globulin, Total: 1.9 g/dL (ref 1.5–4.5)
Glucose: 100 mg/dL — ABNORMAL HIGH (ref 65–99)
Potassium: 4.5 mmol/L (ref 3.5–5.2)
Sodium: 142 mmol/L (ref 134–144)
Total Protein: 6.2 g/dL (ref 6.0–8.5)

## 2019-04-18 LAB — CBC WITH DIFFERENTIAL/PLATELET
Basophils Absolute: 0.1 10*3/uL (ref 0.0–0.2)
Basos: 1 %
EOS (ABSOLUTE): 0.5 10*3/uL — ABNORMAL HIGH (ref 0.0–0.4)
Eos: 6 %
Hematocrit: 36.6 % (ref 34.0–46.6)
Hemoglobin: 12.3 g/dL (ref 11.1–15.9)
Immature Grans (Abs): 0 10*3/uL (ref 0.0–0.1)
Immature Granulocytes: 0 %
Lymphocytes Absolute: 2.1 10*3/uL (ref 0.7–3.1)
Lymphs: 30 %
MCH: 31.1 pg (ref 26.6–33.0)
MCHC: 33.6 g/dL (ref 31.5–35.7)
MCV: 93 fL (ref 79–97)
Monocytes Absolute: 0.6 10*3/uL (ref 0.1–0.9)
Monocytes: 8 %
Neutrophils Absolute: 3.8 10*3/uL (ref 1.4–7.0)
Neutrophils: 55 %
Platelets: 263 10*3/uL (ref 150–450)
RBC: 3.95 x10E6/uL (ref 3.77–5.28)
RDW: 13 % (ref 11.7–15.4)
WBC: 7.1 10*3/uL (ref 3.4–10.8)

## 2019-04-18 LAB — LIPID PANEL
Chol/HDL Ratio: 2.5 ratio (ref 0.0–4.4)
Cholesterol, Total: 196 mg/dL (ref 100–199)
HDL: 80 mg/dL (ref >39)
LDL Chol Calc (NIH): 99 mg/dL (ref 0–99)
Triglycerides: 97 mg/dL (ref 0–149)
VLDL Cholesterol Cal: 17 mg/dL (ref 5–40)

## 2019-04-18 LAB — TSH: TSH: 2.28 u[IU]/mL (ref 0.450–4.500)

## 2019-04-30 DIAGNOSIS — C44629 Squamous cell carcinoma of skin of left upper limb, including shoulder: Secondary | ICD-10-CM | POA: Diagnosis not present

## 2019-05-19 DIAGNOSIS — I10 Essential (primary) hypertension: Secondary | ICD-10-CM | POA: Diagnosis not present

## 2019-05-19 DIAGNOSIS — M25561 Pain in right knee: Secondary | ICD-10-CM | POA: Diagnosis not present

## 2019-05-19 DIAGNOSIS — G8929 Other chronic pain: Secondary | ICD-10-CM | POA: Diagnosis not present

## 2019-06-01 DIAGNOSIS — Z85828 Personal history of other malignant neoplasm of skin: Secondary | ICD-10-CM | POA: Diagnosis not present

## 2019-06-01 DIAGNOSIS — Z08 Encounter for follow-up examination after completed treatment for malignant neoplasm: Secondary | ICD-10-CM | POA: Diagnosis not present

## 2019-06-01 DIAGNOSIS — Z23 Encounter for immunization: Secondary | ICD-10-CM | POA: Diagnosis not present

## 2019-06-09 ENCOUNTER — Other Ambulatory Visit: Payer: Self-pay | Admitting: Family Medicine

## 2019-06-09 DIAGNOSIS — Z1231 Encounter for screening mammogram for malignant neoplasm of breast: Secondary | ICD-10-CM

## 2019-06-29 DIAGNOSIS — Z23 Encounter for immunization: Secondary | ICD-10-CM | POA: Diagnosis not present

## 2019-06-30 ENCOUNTER — Other Ambulatory Visit: Payer: Self-pay

## 2019-07-01 ENCOUNTER — Encounter: Payer: Self-pay | Admitting: Family Medicine

## 2019-07-01 ENCOUNTER — Ambulatory Visit (INDEPENDENT_AMBULATORY_CARE_PROVIDER_SITE_OTHER): Payer: Medicare Other | Admitting: Family Medicine

## 2019-07-01 VITALS — BP 173/80 | HR 83 | Temp 96.8°F | Ht 63.0 in | Wt 151.4 lb

## 2019-07-01 DIAGNOSIS — M170 Bilateral primary osteoarthritis of knee: Secondary | ICD-10-CM | POA: Diagnosis not present

## 2019-07-01 MED ORDER — METHYLPREDNISOLONE ACETATE 80 MG/ML IJ SUSP
80.0000 mg | Freq: Once | INTRAMUSCULAR | Status: AC
  Administered 2019-07-01: 10:00:00 80 mg via INTRAMUSCULAR

## 2019-07-01 NOTE — Progress Notes (Signed)
BP (!) 173/80   Pulse 83   Temp (!) 96.8 F (36 C) (Temporal)   Ht 5\' 3"  (1.6 m)   Wt 151 lb 6.4 oz (68.7 kg)   LMP 05/14/2001 (Approximate)   SpO2 95%   BMI 26.82 kg/m    Subjective:   Patient ID: Shelly Nelson, female    DOB: 08/14/1929, 86 y.o.   MRN: [id number]  HPI: Shelly Nelson is a 86 y.o. female presenting on 07/01/2019 for Knee Pain (right- x 3 weeks)   HPI Patient is coming in complaining of right knee pain is been going on for 3 weeks.  She does not recall any incident or trauma that is been happening but has been bothering her more over the last 3 weeks and she feels like it has been swollen and has extra fluid there.  She says it is very stiff when she gets up and tries to get going until she gets it moving.  She says this is been increased over the past 3 weeks but she has had it previously. She has had injections 3 years ago in that right knee and maybe once in the left knee.  She denies any trauma or fall.  Relevant past medical, surgical, family and social history reviewed and updated as indicated. Interim medical history since our last visit reviewed. Allergies and medications reviewed and updated.  Review of Systems  Constitutional: Negative for chills and fever.  Respiratory: Negative for chest tightness and shortness of breath.   Cardiovascular: Negative for chest pain and leg swelling.  Musculoskeletal: Positive for arthralgias and joint swelling.  Skin: Negative for color change and rash.  Neurological: Negative for light-headedness and headaches.  Psychiatric/Behavioral: Negative for agitation and behavioral problems.  All other systems reviewed and are negative.   Per HPI unless specifically indicated above      Objective:   BP (!) 173/80   Pulse 83   Temp (!) 96.8 F (36 C) (Temporal)   Ht 5\' 3"  (1.6 m)   Wt 151 lb 6.4 oz (68.7 kg)   LMP 05/14/2001 (Approximate)   SpO2 95%   BMI 26.82 kg/m   Wt Readings from Last 3  Encounters:  07/01/19 151 lb 6.4 oz (68.7 kg)  04/17/19 146 lb 12.8 oz (66.6 kg)  01/07/19 146 lb (66.2 kg)    Physical Exam Vitals and nursing note reviewed.  Constitutional:      General: She is not in acute distress.    Appearance: She is well-developed. She is not diaphoretic.  Eyes:     Conjunctiva/sclera: Conjunctivae normal.  Musculoskeletal:        General: Swelling present.     Right knee: Swelling and crepitus present. No bony tenderness. Normal range of motion. Tenderness present over the medial joint line and lateral joint line. No LCL laxity, MCL laxity, ACL laxity or PCL laxity. Normal alignment and normal meniscus.     Left knee: Crepitus present. No swelling or bony tenderness. Normal range of motion. No tenderness. No LCL laxity, MCL laxity, ACL laxity or PCL laxity.Normal alignment and normal meniscus.  Skin:    General: Skin is warm and dry.     Findings: No rash.  Neurological:     Mental Status: She is alert and oriented to person, place, and time.     Coordination: Coordination normal.  Psychiatric:        Behavior: Behavior normal.     Knee bilateral injection: Consent form signed. Risk factors  of bleeding and infection discussed with patient and patient is agreeable towards injection. Patient prepped with Betadine. Lateral approach towards injection used. Injected 80 mg of Depo-Medrol and 1 mL of 2% lidocaine. Patient tolerated procedure well and no side effects from noted. Minimal to no bleeding. Simple bandage applied after.   Assessment & Plan:   Problem List Items Addressed This Visit    None    Visit Diagnoses    Primary osteoarthritis of both knees    -  Primary   Relevant Medications   methylPREDNISolone acetate (DEPO-MEDROL) injection 80 mg (Start on 07/01/2019  9:45 AM)   methylPREDNISolone acetate (DEPO-MEDROL) injection 80 mg (Start on 07/01/2019  9:45 AM)       Follow up plan: Return if symptoms worsen or fail to improve.  Counseling  provided for all of the vaccine components No orders of the defined types were placed in this encounter.   Caryl Pina, MD [facility] 07/01/2019, 9:41 AM

## 2019-07-22 ENCOUNTER — Other Ambulatory Visit: Payer: Self-pay

## 2019-07-22 ENCOUNTER — Ambulatory Visit
Admission: RE | Admit: 2019-07-22 | Discharge: 2019-07-22 | Disposition: A | Discharge status: Home / Self Care | Payer: Medicare Other | Source: Ambulatory Visit | Attending: Family Medicine | Admitting: Family Medicine

## 2019-07-22 DIAGNOSIS — Z1231 Encounter for screening mammogram for malignant neoplasm of breast: Secondary | ICD-10-CM | POA: Diagnosis not present

## 2019-07-23 ENCOUNTER — Other Ambulatory Visit: Payer: Self-pay | Admitting: Family Medicine

## 2019-07-23 DIAGNOSIS — R928 Other abnormal and inconclusive findings on diagnostic imaging of breast: Secondary | ICD-10-CM

## 2019-08-05 ENCOUNTER — Encounter: Payer: Self-pay | Admitting: Certified Nurse Midwife

## 2019-08-06 ENCOUNTER — Ambulatory Visit
Admission: RE | Admit: 2019-08-06 | Discharge: 2019-08-06 | Disposition: A | Discharge status: Home / Self Care | Payer: Medicare Other | Source: Ambulatory Visit | Attending: Family Medicine | Admitting: Family Medicine

## 2019-08-06 ENCOUNTER — Other Ambulatory Visit: Payer: Self-pay

## 2019-08-06 DIAGNOSIS — R928 Other abnormal and inconclusive findings on diagnostic imaging of breast: Secondary | ICD-10-CM | POA: Diagnosis not present

## 2019-08-06 DIAGNOSIS — N6489 Other specified disorders of breast: Secondary | ICD-10-CM | POA: Diagnosis not present

## 2019-08-06 DIAGNOSIS — N6012 Diffuse cystic mastopathy of left breast: Secondary | ICD-10-CM | POA: Diagnosis not present

## 2019-08-20 ENCOUNTER — Other Ambulatory Visit: Payer: Self-pay

## 2019-08-20 ENCOUNTER — Ambulatory Visit (INDEPENDENT_AMBULATORY_CARE_PROVIDER_SITE_OTHER): Payer: Medicare Other | Admitting: Family Medicine

## 2019-08-20 ENCOUNTER — Encounter: Payer: Self-pay | Admitting: Family Medicine

## 2019-08-20 VITALS — BP 178/81 | HR 86 | Temp 98.4°F | Ht 63.0 in | Wt 147.6 lb

## 2019-08-20 DIAGNOSIS — R220 Localized swelling, mass and lump, head: Secondary | ICD-10-CM | POA: Diagnosis not present

## 2019-08-20 DIAGNOSIS — Z9181 History of falling: Secondary | ICD-10-CM | POA: Diagnosis not present

## 2019-08-20 DIAGNOSIS — S0083XA Contusion of other part of head, initial encounter: Secondary | ICD-10-CM

## 2019-08-20 NOTE — Patient Instructions (Signed)
Arnica Gel for bruising   Hematoma A hematoma is a collection of blood. A hematoma can happen:  Under the skin.  In an organ.  In a body space.  In a joint space.  In other tissues. The blood can thicken (clot) to form a lump that you can see and feel. The lump is often hard and may become sore and tender. The lump can be very small or very big. Most hematomas get better in a few days to weeks. However, some hematomas may be serious and need medical care. What are the causes? This condition is caused by:  An injury.  Blood that leaks under the skin.  Problems from surgeries.  Medical conditions that cause bleeding or bruising. What increases the risk? You are more likely to develop this condition if:  You are an older adult.  You use medicines that thin your blood. What are the signs or symptoms? Symptoms depend on where the hematoma is in your body.  If the hematoma is under the skin, there is: ? A firm lump on the body. ? Pain and tenderness in the area. ? Bruising. The skin above the lump may be blue, dark blue, purple-red, or yellowish.  If the hematoma is deep in the tissues or body spaces, there may be: ? Blood in the stomach. This may cause pain in the belly (abdomen), weakness, passing out (fainting), and shortness of breath. ? Blood in the head. This may cause a headache, weakness, trouble speaking or understanding speech, or passing out. How is this diagnosed? This condition is diagnosed based on:  Your medical history.  A physical exam.  Imaging tests, such as ultrasound or CT scan.  Blood tests. How is this treated? Treatment depends on the cause, size, and location of the hematoma. Treatment may include:  Doing nothing. Many hematomas go away on their own without treatment.  Surgery or close monitoring. This may be needed for large hematomas or hematomas that affect the body's organs.  Medicines. These may be given if a medical condition  caused the hematoma. Follow these instructions at home: Managing pain, stiffness, and swelling   If told, put ice on the area. ? Put ice in a plastic bag. ? Place a towel between your skin and the bag. ? Leave the ice on for 20 minutes, 2-3 times a day for the first two days.  If told, put heat on the affected area after putting ice on the area for two days. Use the heat source that your doctor tells you to use. This could be a moist heat pack or a heating pad. To do this: ? Place a towel between your skin and the heat source. ? Leave the heat on for 20-30 minutes. ? Remove the heat if your skin turns bright red. This is very important if you are unable to feel pain, heat, or cold. You may have a greater risk of getting burned.  Raise (elevate) the affected area above the level of your heart while you are sitting or lying down.  Wrap the affected area with an elastic bandage, if told by your doctor. Do not wrap the bandage too tightly.  If your hematoma is on a leg or foot and is painful, your doctor may give you crutches. Use them as told by your doctor. General instructions  Take over-the-counter and prescription medicines only as told by your doctor.  Keep all follow-up visits as told by your doctor. This is important. Contact a doctor  if:  You have a fever.  The swelling or bruising gets worse.  You start to get more hematomas. Get help right away if:  Your pain gets worse.  Your pain is not getting better with medicine.  Your skin over the hematoma breaks or starts to bleed.  Your hematoma is in your chest or belly and you: ? Pass out. ? Feel weak. ? Become short of breath.  You have a hematoma on your scalp that is caused by a fall or injury, and you: ? Have a headache that gets worse. ? Have trouble speaking or understanding speech. ? Become less alert or you pass out. Summary  A hematoma is a collection of blood in any part of your body.  Most hematomas  get better on their own in a few days to weeks. Some may need medical care.  Follow instructions from your doctor about how to care for your hematoma.  Contact a doctor if the swelling or bruising gets worse, or if you are short of breath. This information is not intended to replace advice given to you by your health care provider. Make sure you discuss any questions you have with your health care provider. Document Revised: 10/03/2017 Document Reviewed: 10/03/2017 Elsevier Patient Education  2020 [employer].

## 2019-08-20 NOTE — Progress Notes (Signed)
Assessment & Plan:  1-3. Facial hematoma, initial encounter/Localized swelling, mass, and lump of head/History of recent fall - Arnica gel for bruising. Education provided on hematomas.  - CT Maxillofacial WO CM; Future   Follow up plan: Return if symptoms worsen or fail to improve.  Hendricks Limes, MSN, APRN, FNP-C [facility]  Subjective:   Patient ID: Shelly Nelson, female    DOB: 12/31/1929, 86 y.o.   MRN: [id number]  HPI: Shelly Nelson is a 86 y.o. female presenting on 08/20/2019 for Fall (Patient states she had a fall outside on the cement x 2 weeks ago.  States when she fell she hit her face.  Patient still has a knot on her left cheek and the swelling and brusing is now going down her face. )  Patient fell walking through the parking deck to her car ~2 weeks ago. She reports when she turned around to figure out why she fell, the only thing she could come up with was that their was a incline where she was walking. She reports she landed on the left side of her face in the area of bruising/swelling. The bruising is running down her face.    ROS: Negative unless specifically indicated above in HPI.   Relevant past medical history reviewed and updated as indicated.   Allergies and medications reviewed and updated.   Current Outpatient Medications:  .  ALPRAZolam (XANAX) 0.25 MG tablet, Take 1 tablet (0.25 mg total) by mouth at bedtime as needed for anxiety., Disp: 30 tablet, Rfl: 1 .  atenolol (TENORMIN) 25 MG tablet, TAKE 1/2 (ONE-HALF) TABLET BY MOUTH ONCE DAILY, Disp: 75 tablet, Rfl: 3 .  Calcium Carbonate-Vitamin D (CALCIUM-D) 600-400 MG-UNIT TABS, Take 1 tablet by mouth daily., Disp: , Rfl:  .  celecoxib (CELEBREX) 200 MG capsule, Take 1 capsule (200 mg total) by mouth daily. With food, Disp: 30 capsule, Rfl: 5 .  conjugated estrogens (PREMARIN) vaginal cream, Use 0.5 grams vaginally 2 x a week at hs., Disp: 30 g, Rfl: 2 .  docusate  sodium (DULCOLAX) 100 MG capsule, Take 100 mg by mouth daily., Disp: , Rfl:  .  folic acid (FOLVITE) Q000111Q MCG tablet, Take 800 mcg by mouth daily., Disp: , Rfl:  .  Garlic 123XX123 MG CAPS, Take 1 capsule by mouth daily., Disp: , Rfl:  .  loratadine (CLARITIN) 10 MG tablet, Take 10 mg by mouth daily., Disp: , Rfl:  .  losartan (COZAAR) 25 MG tablet, Take 1 tablet (25 mg total) by mouth daily., Disp: 90 tablet, Rfl: 3 .  magnesium oxide (MAG-OX) 400 MG tablet, Take 400 mg by mouth 2 (two) times daily., Disp: , Rfl:  .  Multiple Vitamin (MULTIVITAMIN WITH MINERALS) TABS, Take 1 tablet by mouth daily., Disp: , Rfl:  .  Omega-3 Fatty Acids (FISH OIL) 1200 MG CAPS, Take 1,200 mg by mouth daily., Disp: , Rfl:  .  omeprazole (PRILOSEC) 40 MG capsule, Take 1 capsule (40 mg total) by mouth daily., Disp: 90 capsule, Rfl: 3 .  pravastatin (PRAVACHOL) 40 MG tablet, Take 1 tablet (40 mg total) by mouth daily., Disp: 90 tablet, Rfl: 3 .  psyllium (METAMUCIL) 58.6 % powder, Take 1 packet by mouth 2 (two) times daily., Disp: , Rfl:  .  venlafaxine XR (EFFEXOR-XR) 75 MG 24 hr capsule, TAKE 1 CAPSULE BY MOUTH ONCE DAILY WITH BREAKFAST, Disp: 90 capsule, Rfl: 3  Allergies  Allergen Reactions  . Cephalexin Hives  Objective:   BP (!) 178/81   Pulse 86   Temp 98.4 F (36.9 C) (Temporal)   Ht 5\' 3"  (1.6 m)   Wt 147 lb 9.6 oz (67 kg)   LMP 05/14/2001 (Approximate)   SpO2 97%   BMI 26.15 kg/m    Physical Exam Vitals reviewed.  Constitutional:      General: She is not in acute distress.    Appearance: Normal appearance. She is not ill-appearing, toxic-appearing or diaphoretic.  HENT:     Head: Normocephalic and atraumatic.  Eyes:     General: No scleral icterus.       Right eye: No discharge.        Left eye: No discharge.     Conjunctiva/sclera: Conjunctivae normal.  Cardiovascular:     Rate and Rhythm: Normal rate.  Pulmonary:     Effort: Pulmonary effort is normal. No respiratory distress.    Musculoskeletal:        General: Normal range of motion.     Cervical back: Normal range of motion.  Skin:    General: Skin is warm and dry.     Capillary Refill: Capillary refill takes less than 2 seconds.     Findings: Bruising (under left eye, left cheek - running down left side of face and neck) present.     Comments: Hematoma to left cheek bone.   Neurological:     General: No focal deficit present.     Mental Status: She is alert and oriented to person, place, and time. Mental status is at baseline.  Psychiatric:        Mood and Affect: Mood normal.        Behavior: Behavior normal.        Thought Content: Thought content normal.        Judgment: Judgment normal.

## 2019-08-25 ENCOUNTER — Telehealth: Payer: Self-pay | Admitting: Family Medicine

## 2019-08-25 NOTE — Chronic Care Management (AMB) (Signed)
  Chronic Care Management   Note  08/25/2019 Name: Shelly Nelson MRN: [id number] DOB: [birth year]  Shelly Nelson is a 86 y.o. year old female who is a primary care patient of Dettinger, Fransisca Kaufmann, MD. I reached out to Caffie Pinto by phone today in response to a referral sent by Ms. Binnie Rail [place]forth's health plan.     Ms. Chillemi was given information about Chronic Care Management services today including:  1. CCM service includes personalized support from designated clinical staff supervised by her physician, including individualized plan of care and coordination with other care providers 2. 24/7 contact phone numbers for assistance for urgent and routine care needs. 3. Service will only be billed when office clinical staff spend 20 minutes or more in a month to coordinate care. 4. Only one practitioner may furnish and bill the service in a calendar month. 5. The patient may stop CCM services at any time (effective at the end of the month) by phone call to the office staff. 6. The patient will be responsible for cost sharing (co-pay) of up to 20% of the service fee (after annual deductible is met).  Patient did not agree to enrollment in care management services and does not wish to consider at this time.  Follow up plan: The patient has been provided with contact information for the care management team and has been advised to call with any health related questions or concerns.   Noreene Larsson, [place], [place], [place] [address] Direct Dial: [phone] [email or web address] Website: [email or web address]

## 2019-09-02 ENCOUNTER — Encounter: Payer: Self-pay | Admitting: Family Medicine

## 2019-09-03 ENCOUNTER — Encounter: Payer: Self-pay | Admitting: Family Medicine

## 2019-09-08 ENCOUNTER — Ambulatory Visit (HOSPITAL_COMMUNITY): Payer: Medicare Other

## 2019-10-16 ENCOUNTER — Other Ambulatory Visit: Payer: Self-pay

## 2019-10-16 ENCOUNTER — Ambulatory Visit (INDEPENDENT_AMBULATORY_CARE_PROVIDER_SITE_OTHER): Payer: Medicare Other | Admitting: Family Medicine

## 2019-10-16 ENCOUNTER — Encounter: Payer: Self-pay | Admitting: Family Medicine

## 2019-10-16 VITALS — BP 124/77 | HR 73 | Temp 97.8°F | Ht 63.0 in | Wt 145.0 lb

## 2019-10-16 DIAGNOSIS — M1711 Unilateral primary osteoarthritis, right knee: Secondary | ICD-10-CM | POA: Diagnosis not present

## 2019-10-16 DIAGNOSIS — E039 Hypothyroidism, unspecified: Secondary | ICD-10-CM

## 2019-10-16 DIAGNOSIS — E7800 Pure hypercholesterolemia, unspecified: Secondary | ICD-10-CM

## 2019-10-16 DIAGNOSIS — F419 Anxiety disorder, unspecified: Secondary | ICD-10-CM

## 2019-10-16 DIAGNOSIS — I10 Essential (primary) hypertension: Secondary | ICD-10-CM | POA: Diagnosis not present

## 2019-10-16 DIAGNOSIS — Z79891 Long term (current) use of opiate analgesic: Secondary | ICD-10-CM | POA: Diagnosis not present

## 2019-10-16 MED ORDER — METHYLPREDNISOLONE ACETATE 80 MG/ML IJ SUSP
80.0000 mg | Freq: Once | INTRAMUSCULAR | Status: AC
Start: 2019-10-16 — End: 2019-10-16
  Administered 2019-10-16: 80 mg via INTRAMUSCULAR

## 2019-10-16 NOTE — Progress Notes (Signed)
BP 124/77   Pulse 73   Temp 97.8 F (36.6 C)   Ht '5\' 3"'$  (1.6 m)   Wt 145 lb (65.8 kg)   LMP 05/14/2001 (Approximate)   SpO2 96%   BMI 25.69 kg/m    Subjective:   Patient ID: Shelly Nelson, female    DOB: Oct 09, 1929, 86 y.o.   MRN: [id number]  HPI: Shelly Nelson is a 86 y.o. female presenting on 10/16/2019 for Medical Management of Chronic Issues, Hypertension, and Hypothyroidism   HPI Hypertension Patient is currently on losartan and atenolol, and their blood pressure today is 124/77. Patient denies any lightheadedness or dizziness. Patient denies headaches, blurred vision, chest pains, shortness of breath, or weakness. Denies any side effects from medication and is content with current medication.   Hyperlipidemia Patient is coming in for recheck of his hyperlipidemia. The patient is currently taking fish oil. They deny any issues with myalgias or history of liver damage from it. They deny any focal numbness or weakness or chest pain.   Hypothyroidism recheck Patient is coming in for thyroid recheck today as well. They deny any issues with hair changes or heat or cold problems or diarrhea or constipation. They deny any chest pain or palpitations. They are currently on no medication but weight has been fluctuating up and down we are monitoring.  Anxiety Patient uses the occasional Xanax but not every day, her last 30-day prescription was filled in February 4 months ago.  Mainly to help sleep and calm her nerves at nighttime when it builds up Current rx-alprazolam 0.25 nightly as needed # meds rx-30 Effectiveness of current meds-works well. Adverse reactions form meds-none  Pill count performed-No Last drug screen -N/A ( high risk q12m moderate risk q612mlow risk yearly ) Urine drug screen today- Yes Was the NCDune Acreseviewed-yes  If yes were their any concerning findings? -None  No flowsheet data found.   Controlled substance contract signed on:  Today  Right knee osteoarthritis Patient is coming in complaining of right knee pain that is worsened again and she has some swelling at its been getting them worse over the past few months.  She does have x-rays with [facility] that show osteoarthritis of the right knee and she has had one previous injection but did help.  She denies any popping or catching or giving way.  Relevant past medical, surgical, family and social history reviewed and updated as indicated. Interim medical history since our last visit reviewed. Allergies and medications reviewed and updated.  Review of Systems  Constitutional: Negative for chills and fever.  Eyes: Negative for visual disturbance.  Respiratory: Negative for chest tightness and shortness of breath.   Cardiovascular: Negative for chest pain and leg swelling.  Musculoskeletal: Negative for back pain and gait problem.  Skin: Negative for rash.  Neurological: Negative for light-headedness and headaches.  Psychiatric/Behavioral: Positive for dysphoric mood. Negative for agitation, behavioral problems, self-injury, sleep disturbance and suicidal ideas. The patient is nervous/anxious.   All other systems reviewed and are negative.   Per HPI unless specifically indicated above   Allergies as of 10/16/2019      Reactions   Cephalexin Hives      Medication List       Accurate as of October 16, 2019  8:17 AM. If you have any questions, ask your nurse or doctor.        STOP taking these medications   celecoxib 200 MG capsule Commonly known as: CeleBREX Stopped by: JoVonna Kotyk  A Vasilisa Vore, MD     TAKE these medications   ALPRAZolam 0.25 MG tablet Commonly known as: XANAX Take 1 tablet (0.25 mg total) by mouth at bedtime as needed for anxiety.   atenolol 25 MG tablet Commonly known as: TENORMIN TAKE 1/2 (ONE-HALF) TABLET BY MOUTH ONCE DAILY   Calcium-D 600-400 MG-UNIT Tabs Take 1 tablet by mouth daily.   conjugated estrogens vaginal  cream Commonly known as: Premarin Use 0.5 grams vaginally 2 x a week at hs.   DULCOLAX 100 MG capsule Generic drug: docusate sodium Take 100 mg by mouth daily.   Fish Oil 1200 MG Caps Take 1,200 mg by mouth daily.   folic acid 578 MCG tablet Commonly known as: FOLVITE Take 800 mcg by mouth daily.   Garlic 4696 MG Caps Take 1 capsule by mouth daily.   loratadine 10 MG tablet Commonly known as: CLARITIN Take 10 mg by mouth daily as needed.   losartan 25 MG tablet Commonly known as: COZAAR Take 1 tablet (25 mg total) by mouth daily.   magnesium oxide 400 MG tablet Commonly known as: MAG-OX Take 400 mg by mouth 2 (two) times daily.   multivitamin with minerals Tabs tablet Take 1 tablet by mouth daily.   omeprazole 40 MG capsule Commonly known as: PRILOSEC Take 1 capsule (40 mg total) by mouth daily.   pravastatin 40 MG tablet Commonly known as: PRAVACHOL Take 1 tablet (40 mg total) by mouth daily.   psyllium 58.6 % powder Commonly known as: METAMUCIL Take 1 packet by mouth 2 (two) times daily.   venlafaxine XR 75 MG 24 hr capsule Commonly known as: EFFEXOR-XR TAKE 1 CAPSULE BY MOUTH ONCE DAILY WITH BREAKFAST        Objective:   LMP 05/14/2001 (Approximate)   Wt Readings from Last 3 Encounters:  08/20/19 147 lb 9.6 oz (67 kg)  07/01/19 151 lb 6.4 oz (68.7 kg)  04/17/19 146 lb 12.8 oz (66.6 kg)    Physical Exam Vitals and nursing note reviewed.  Constitutional:      General: She is not in acute distress.    Appearance: She is well-developed. She is not diaphoretic.  Eyes:     Conjunctiva/sclera: Conjunctivae normal.  Cardiovascular:     Rate and Rhythm: Normal rate and regular rhythm.     Heart sounds: Normal heart sounds. No murmur.  Pulmonary:     Effort: Pulmonary effort is normal. No respiratory distress.     Breath sounds: Normal breath sounds. No wheezing.  Musculoskeletal:        General: No tenderness. Normal range of motion.  Skin:     General: Skin is warm and dry.     Findings: No rash.  Neurological:     Mental Status: She is alert and oriented to person, place, and time.     Coordination: Coordination normal.  Psychiatric:        Behavior: Behavior normal.     Knee injection: Consent form signed. Risk factors of bleeding and infection discussed with patient and patient is agreeable towards injection. Patient prepped with Betadine. Lateral approach towards injection used. Injected 80 mg of Depo-Medrol and 1 mL of 2% lidocaine. Patient tolerated procedure well and no side effects from noted. Minimal to no bleeding. Simple bandage applied after.   Assessment & Plan:   Problem List Items Addressed This Visit      Cardiovascular and Mediastinum   HTN (hypertension) - Primary   Relevant Orders   CBC with  Differential/Platelet   CMP14+EGFR     Endocrine   Hypothyroid   Relevant Orders   CBC with Differential/Platelet   TSH     Other   HLD (hyperlipidemia)   Relevant Orders   Lipid panel   Anxiety   Relevant Orders   CBC with Differential/Platelet   ToxASSURE Select 13 (MW), Urine    Other Visit Diagnoses    Primary osteoarthritis of right knee       Relevant Medications   methylPREDNISolone acetate (DEPO-MEDROL) injection 80 mg (Start on 10/16/2019  8:45 AM)      Blood pressure looks great, continue current medication, right knee osteoarthritis, will do injection and see how it does for her.  Has x-rays at orthopedic  Continue alprazolam as needed, use sparingly   Follow up plan: Return in about 6 months (around 04/16/2020), or if symptoms worsen or fail to improve, for Hypertension and thyroid and cholesterol recheck.  Counseling provided for all of the vaccine components No orders of the defined types were placed in this encounter.   Caryl Pina, MD [facility] 10/16/2019, 8:17 AM

## 2019-10-17 LAB — CMP14+EGFR
ALT: 17 IU/L (ref 0–32)
AST: 34 IU/L (ref 0–40)
Albumin/Globulin Ratio: 2 (ref 1.2–2.2)
Albumin: 4.3 g/dL (ref 3.6–4.6)
Alkaline Phosphatase: 73 IU/L (ref 48–121)
BUN/Creatinine Ratio: 14 (ref 12–28)
BUN: 14 mg/dL (ref 8–27)
Bilirubin Total: 0.3 mg/dL (ref 0.0–1.2)
CO2: 25 mmol/L (ref 20–29)
Calcium: 9.5 mg/dL (ref 8.7–10.3)
Chloride: 101 mmol/L (ref 96–106)
Creatinine, Ser: 1.01 mg/dL — ABNORMAL HIGH (ref 0.57–1.00)
GFR calc Af Amer: 58 mL/min/{1.73_m2} — ABNORMAL LOW (ref >59)
GFR calc non Af Amer: 50 mL/min/{1.73_m2} — ABNORMAL LOW (ref >59)
Globulin, Total: 2.2 g/dL (ref 1.5–4.5)
Glucose: 98 mg/dL (ref 65–99)
Potassium: 4.5 mmol/L (ref 3.5–5.2)
Sodium: 138 mmol/L (ref 134–144)
Total Protein: 6.5 g/dL (ref 6.0–8.5)

## 2019-10-17 LAB — CBC WITH DIFFERENTIAL/PLATELET
Basophils Absolute: 0.1 10*3/uL (ref 0.0–0.2)
Basos: 1 %
EOS (ABSOLUTE): 0.3 10*3/uL (ref 0.0–0.4)
Eos: 6 %
Hematocrit: 36.1 % (ref 34.0–46.6)
Hemoglobin: 12 g/dL (ref 11.1–15.9)
Immature Grans (Abs): 0 10*3/uL (ref 0.0–0.1)
Immature Granulocytes: 0 %
Lymphocytes Absolute: 1.6 10*3/uL (ref 0.7–3.1)
Lymphs: 32 %
MCH: 30.7 pg (ref 26.6–33.0)
MCHC: 33.2 g/dL (ref 31.5–35.7)
MCV: 92 fL (ref 79–97)
Monocytes Absolute: 0.4 10*3/uL (ref 0.1–0.9)
Monocytes: 8 %
Neutrophils Absolute: 2.6 10*3/uL (ref 1.4–7.0)
Neutrophils: 53 %
Platelets: 262 10*3/uL (ref 150–450)
RBC: 3.91 x10E6/uL (ref 3.77–5.28)
RDW: 13 % (ref 11.7–15.4)
WBC: 5 10*3/uL (ref 3.4–10.8)

## 2019-10-17 LAB — LIPID PANEL
Chol/HDL Ratio: 2.5 ratio (ref 0.0–4.4)
Cholesterol, Total: 197 mg/dL (ref 100–199)
HDL: 79 mg/dL (ref >39)
LDL Chol Calc (NIH): 100 mg/dL — ABNORMAL HIGH (ref 0–99)
Triglycerides: 102 mg/dL (ref 0–149)
VLDL Cholesterol Cal: 18 mg/dL (ref 5–40)

## 2019-10-17 LAB — TSH: TSH: 1.91 u[IU]/mL (ref 0.450–4.500)

## 2019-10-19 ENCOUNTER — Other Ambulatory Visit: Payer: Self-pay

## 2019-10-21 LAB — TOXASSURE SELECT 13 (MW), URINE

## 2019-11-23 ENCOUNTER — Other Ambulatory Visit: Payer: Self-pay | Admitting: Family Medicine

## 2019-12-06 ENCOUNTER — Other Ambulatory Visit: Payer: Self-pay | Admitting: Family Medicine

## 2019-12-09 ENCOUNTER — Ambulatory Visit (INDEPENDENT_AMBULATORY_CARE_PROVIDER_SITE_OTHER): Payer: Medicare Other

## 2019-12-09 DIAGNOSIS — Z0000 Encounter for general adult medical examination without abnormal findings: Secondary | ICD-10-CM | POA: Diagnosis not present

## 2019-12-09 NOTE — Progress Notes (Signed)
MEDICARE ANNUAL WELLNESS VISIT  12/09/2019  Telephone Visit Disclaimer This Medicare AWV was conducted by telephone due to national recommendations for restrictions regarding the COVID-19 Pandemic (e.g. social distancing).  I verified, using two identifiers, that I am speaking with Shelly Nelson or their authorized healthcare agent. I discussed the limitations, risks, security, and privacy concerns of performing an evaluation and management service by telephone and the potential availability of an in-person appointment in the future. The patient expressed understanding and agreed to proceed.   Subjective:  Shelly Nelson is a 86 y.o. female patient of Dettinger, Fransisca Kaufmann, MD who had a Medicare Annual Wellness Visit today via telephone. Shelly Nelson is Retired and lives with their spouse. She has one child and two grandchildren.  She  reports that she is socially active and does interact with friends/family regularly.She is minimally physically active and enjoys reading and watching tv.  Patient Care Team: Dettinger, Fransisca Kaufmann, MD as PCP - General (Family Medicine) Bjorn Loser, MD as Attending Physician (Urology) Teena Irani, MD (Inactive) as Attending Physician (Gastroenterology) Joan Flores Lubertha South, MD as Attending Physician (Obstetrics and Gynecology)  Advanced Directives 12/09/2019 10/07/2018 06/09/2012 06/04/2012  Does Patient Have a Medical Advance Directive? Yes No Patient does not have advance directive Patient does not have advance directive;Patient would not like information  Type of Advance Directive [place]  Does patient want to make changes to medical advance directive? No - Patient declined - - -  Copy of [place] in Chart? No - copy requested - - -  Would patient like information on creating a medical advance directive? No - Patient declined No - Patient declined - -  Pre-existing out of facility DNR order (yellow form  or pink MOST form) - - - No    Hospital Utilization Over the Past 12 Months: # of hospitalizations or ER visits: 0 # of surgeries: 0  Review of Systems    Patient reports that her overall health is unchanged compared to last year.  History obtained from chart review and the patient  Patient Reported Readings (BP, Pulse, CBG, Weight, etc) none  Pain Assessment Pain : 0-10 Pain Score: 5  Pain Location: Back Pain Orientation: Lower Pain Descriptors / Indicators: Aching Pain Onset: Other (comment) (years) Pain Frequency: Constant Pain Relieving Factors: Advil, rest Effect of Pain on Daily Activities: has to be more careful what she does and take things slower  Pain Relieving Factors: Advil, rest  Current Medications & Allergies (verified) Allergies as of 12/09/2019      Reactions   Cephalexin Hives      Medication List       Accurate as of December 09, 2019  2:30 PM. If you have any questions, ask your nurse or doctor.        ALPRAZolam 0.25 MG tablet Commonly known as: XANAX Take 1 tablet (0.25 mg total) by mouth at bedtime as needed for anxiety.   atenolol 25 MG tablet Commonly known as: TENORMIN TAKE 1/2 (ONE-HALF) TABLET BY MOUTH ONCE DAILY   Calcium-D 600-400 MG-UNIT Tabs Take 1 tablet by mouth daily.   conjugated estrogens vaginal cream Commonly known as: Premarin Use 0.5 grams vaginally 2 x a week at hs.   DULCOLAX 100 MG capsule Generic drug: docusate sodium Take 100 mg by mouth daily.   Fish Oil 1200 MG Caps Take 1,200 mg by mouth daily.   folic acid 409 MCG tablet Commonly known  as: FOLVITE Take 800 mcg by mouth daily.   Garlic 3704 MG Caps Take 1 capsule by mouth daily.   loratadine 10 MG tablet Commonly known as: CLARITIN Take 10 mg by mouth daily as needed.   losartan 25 MG tablet Commonly known as: COZAAR Take 1 tablet (25 mg total) by mouth daily.   magnesium oxide 400 MG tablet Commonly known as: MAG-OX Take 400 mg by mouth 2  (two) times daily.   multivitamin with minerals Tabs tablet Take 1 tablet by mouth daily.   omeprazole 40 MG capsule Commonly known as: PRILOSEC Take 1 capsule by mouth once daily   pravastatin 40 MG tablet Commonly known as: PRAVACHOL Take 1 tablet by mouth once daily   psyllium 58.6 % powder Commonly known as: METAMUCIL Take 1 packet by mouth 2 (two) times daily.   venlafaxine XR 75 MG 24 hr capsule Commonly known as: EFFEXOR-XR TAKE 1 CAPSULE BY MOUTH ONCE DAILY WITH BREAKFAST       History (reviewed): Past Medical History:  Diagnosis Date  . Allergy   . Anxiety   . Arthritis   . Basal cell carcinoma   . GERD (gastroesophageal reflux disease) 2012  . Hypercholesteremia 2000  . Hypertension 2000  . Spinal stenosis, lumbar region, without neurogenic claudication 2012  . Squamous cell carcinoma   . Thyroid disease 2013   thyroid nodule with needle aspiration   Past Surgical History:  Procedure Laterality Date  . BACK SURGERY  11/08/2010   lumbar-bulging disc  . CATARACT EXTRACTION     left eye-2011-Dr Hunt  . CATARACT EXTRACTION W/PHACO  06/09/2012   Procedure: CATARACT EXTRACTION PHACO AND INTRAOCULAR LENS PLACEMENT (IOC);  Surgeon: Tonny Branch, MD;  Location: [facility];  Service: Ophthalmology;  Laterality: Left;  CDE=13.91  . skin cancer removal N/A 09/10/13  . THYROID LOBECTOMY  age 4  . TONSILLECTOMY     age 9  . TUBAL LIGATION     Family History  Problem Relation Age of Onset  . Hypertension Mother   . Anxiety disorder Mother   . Breast cancer Mother 48       metasis to lung and bones  . Hypertension Father   . COPD Father   . Hypertension Maternal Grandmother   . Diabetes Maternal Grandmother        type 2  . Hypertension Paternal Grandmother   . Heart disease Paternal Grandmother   . Stroke Maternal Grandfather   . COPD Paternal Grandfather   . Breast cancer Maternal Aunt   . Cancer Maternal Uncle        X 5 with various cancers   Social  History   Socioeconomic History  . Marital status: Married    Spouse name: Gwyndolyn Saxon  . Number of children: 1  . Years of education: Not on file  . Highest education level: High school graduate  Occupational History    Employer: RETIRED  Tobacco Use  . Smoking status: Never Smoker  . Smokeless tobacco: Never Used  Vaping Use  . Vaping Use: Never used  Substance and Sexual Activity  . Alcohol use: No  . Drug use: No  . Sexual activity: Not Currently    Birth control/protection: Post-menopausal, Surgical    Comment: BTL  Other Topics Concern  . Not on file  Social History Narrative  . Not on file   Social Determinants of Health   Financial Resource Strain:   . Difficulty of Paying Living Expenses:   Food Insecurity:   .  Worried About Charity fundraiser in the Last Year:   . Arboriculturist in the Last Year:   Transportation Needs:   . Film/video editor (Medical):   Marland Kitchen Lack of Transportation (Non-Medical):   Physical Activity:   . Days of Exercise per Week:   . Minutes of Exercise per Session:   Stress:   . Feeling of Stress :   Social Connections:   . Frequency of Communication with Friends and Family:   . Frequency of Social Gatherings with Friends and Family:   . Attends Religious Services:   . Active Member of Clubs or Organizations:   . Attends Archivist Meetings:   Marland Kitchen Marital Status:     Activities of Daily Living In your present state of health, do you have any difficulty performing the following activities: 12/09/2019  Hearing? N  Vision? N  Difficulty concentrating or making decisions? N  Walking or climbing stairs? N  Dressing or bathing? N  Doing errands, shopping? N  Preparing Food and eating ? N  Using the Toilet? N  In the past six months, have you accidently leaked urine? N  Do you have problems with loss of bowel control? N  Managing your Medications? N  Managing your Finances? N  Housekeeping or managing your Housekeeping? N    Some recent data might be hidden    Patient Education/ Literacy How often do you need to have someone help you when you read instructions, pamphlets, or other written materials from your doctor or pharmacy?: 1 - Never What is the last grade level you completed in school?: 12th grade  Exercise Current Exercise Habits: The patient does not participate in regular exercise at present, Exercise limited by: orthopedic condition(s)  Diet Patient reports consuming 3 meals a day and 0 snack(s) a day Patient reports that her primary diet is: Regular Patient reports that she does have regular access to food.   Depression Screen PHQ 2/9 Scores 12/09/2019 10/16/2019 07/01/2019 04/17/2019 01/07/2019 10/07/2018 05/12/2018  PHQ - 2 Score 0 0 0 0 0 0 0     Fall Risk Fall Risk  12/09/2019 10/16/2019 08/20/2019 07/01/2019 04/17/2019  Falls in the past year? 1 1 1 1 1   Number falls in past yr: 1 1 1  0 1  Injury with Fall? 1 1 1  0 0  Comment bruising, scrapes, soreness - - - -  Risk for fall due to : History of fall(s) Impaired balance/gait - - -  Follow up Falls evaluation completed Falls evaluation completed Falls prevention discussed - -     Objective:  Shelly Nelson seemed alert and oriented and she participated appropriately during our telephone visit.  Blood Pressure Weight BMI  BP Readings from Last 3 Encounters:  10/16/19 124/77  08/20/19 (!) 178/81  07/01/19 (!) 173/80   Wt Readings from Last 3 Encounters:  10/16/19 145 lb (65.8 kg)  08/20/19 147 lb 9.6 oz (67 kg)  07/01/19 151 lb 6.4 oz (68.7 kg)   BMI Readings from Last 1 Encounters:  10/16/19 25.69 kg/m    *Unable to obtain current vital signs, weight, and BMI due to telephone visit type  Hearing/Vision  . Shelly Nelson did not seem to have difficulty with hearing/understanding during the telephone conversation . Reports that she has had a formal eye exam by an eye care professional within the past year . Reports that she has not had  a formal hearing evaluation within the past year *Unable to fully  assess hearing and vision during telephone visit type  Cognitive Function: 6CIT Screen 12/09/2019 10/07/2018  What Year? 0 points 0 points  What month? 0 points 0 points  What time? 0 points 0 points  Count back from 20 0 points 0 points  Months in reverse 0 points 0 points  Repeat phrase 0 points 0 points  Total Score 0 0   (Normal:0-7, Significant for Dysfunction: >8)  Normal Cognitive Function Screening: Yes   Immunization & Health Maintenance Record Immunization History  Administered Date(s) Administered  . Fluad Quad(high Dose 65+) 02/27/2019  . Influenza, High Dose Seasonal PF 02/08/2017, 02/20/2018  . Influenza,inj,Quad PF,6+ Mos 02/10/2013, 02/18/2014, 02/16/2015, 02/02/2016  . Moderna SARS-COVID-2 Vaccination 06/01/2019, 06/29/2019  . Pneumococcal Conjugate-13 02/18/2014  . Pneumococcal Polysaccharide-23 05/14/2005  . Tdap 09/10/2012  . Zoster 02/12/2008    Health Maintenance  Topic Date Due  . INFLUENZA VACCINE  12/13/2019  . MAMMOGRAM  07/21/2020  . TETANUS/TDAP  09/11/2022  . DEXA SCAN  Completed  . COVID-19 Vaccine  Completed  . PNA vac Low Risk Adult  Completed       Assessment  This is a routine wellness examination for Shelly Nelson.  Health Maintenance: Due or Overdue There are no preventive care reminders to display for this patient.  Shelly Nelson does not need a referral for Community Assistance: Care Management:   no Social Work:    no Prescription Assistance:  no Nutrition/Diabetes Education:  no   Plan:  Personalized Goals Goals Addressed            This Visit's Progress   . Patient Stated       12/09/2019 AWV Goal: Exercise for General Health   Patient will verbalize understanding of the benefits of increased physical activity:  Exercising regularly is important. It will improve your overall fitness, flexibility, and endurance.  Regular exercise  also will improve your overall health. It can help you control your weight, reduce stress, and improve your bone density.  Over the next year, patient will increase physical activity as tolerated with a goal of at least 150 minutes of moderate physical activity per week.   You can tell that you are exercising at a moderate intensity if your heart starts beating faster and you start breathing faster but can still hold a conversation.  Moderate-intensity exercise ideas include:  Walking 1 mile (1.6 km) in about 15 minutes  Biking  Hiking  Golfing  Dancing  Water aerobics  Patient will verbalize understanding of everyday activities that increase physical activity by providing examples like the following: ? Yard work, such as: ? Pushing a Conservation officer, nature ? Raking and bagging leaves ? Washing your car ? Pushing a stroller ? Shoveling snow ? Gardening ? Washing windows or floors  Patient will be able to explain general safety guidelines for exercising:   Before you start a new exercise program, talk with your health care provider.  Do not exercise so much that you hurt yourself, feel dizzy, or get very short of breath.  Wear comfortable clothes and wear shoes with good support.  Drink plenty of water while you exercise to prevent dehydration or heat stroke.  Work out until your breathing and your heartbeat get faster.       Personalized Health Maintenance & Screening Recommendations  Bone densitometry screening  Lung Cancer Screening Recommended: no (Low Dose CT Chest recommended if Age 75-80 years, 30 pack-year currently smoking OR have quit w/in past 15 years) Hepatitis  C Screening recommended: no HIV Screening recommended: no  Advanced Directives: Written information was not prepared per patient's request.  Referrals & Orders No orders of the defined types were placed in this encounter.   Follow-up Plan . Follow-up with Dettinger, Fransisca Kaufmann, MD as planned . Schedule  dexa scan    I have personally reviewed and noted the following in the patient's chart:   . Medical and social history . Use of alcohol, tobacco or illicit drugs  . Current medications and supplements . Functional ability and status . Nutritional status . Physical activity . Advanced directives . List of other physicians . Hospitalizations, surgeries, and ER visits in previous 12 months . Vitals . Screenings to include cognitive, depression, and falls . Referrals and appointments  In addition, I have reviewed and discussed with Shelly Nelson certain preventive protocols, quality metrics, and best practice recommendations. A written personalized care plan for preventive services as well as general preventive health recommendations is available and can be mailed to the patient at her request.      Felicity Coyer, LPN    [id number]

## 2020-01-11 ENCOUNTER — Other Ambulatory Visit: Payer: Self-pay | Admitting: Family Medicine

## 2020-01-11 DIAGNOSIS — F419 Anxiety disorder, unspecified: Secondary | ICD-10-CM

## 2020-01-17 ENCOUNTER — Other Ambulatory Visit: Payer: Self-pay | Admitting: Family Medicine

## 2020-01-17 DIAGNOSIS — I10 Essential (primary) hypertension: Secondary | ICD-10-CM

## 2020-01-31 ENCOUNTER — Other Ambulatory Visit: Payer: Self-pay | Admitting: Family Medicine

## 2020-01-31 DIAGNOSIS — I10 Essential (primary) hypertension: Secondary | ICD-10-CM

## 2020-02-19 ENCOUNTER — Ambulatory Visit (INDEPENDENT_AMBULATORY_CARE_PROVIDER_SITE_OTHER): Payer: Medicare Other

## 2020-02-19 ENCOUNTER — Other Ambulatory Visit: Payer: Self-pay

## 2020-02-19 DIAGNOSIS — Z23 Encounter for immunization: Secondary | ICD-10-CM

## 2020-02-21 ENCOUNTER — Other Ambulatory Visit: Payer: Self-pay | Admitting: Family Medicine

## 2020-03-03 ENCOUNTER — Ambulatory Visit (INDEPENDENT_AMBULATORY_CARE_PROVIDER_SITE_OTHER): Payer: Medicare Other | Admitting: Family Medicine

## 2020-03-03 ENCOUNTER — Other Ambulatory Visit: Payer: Self-pay

## 2020-03-03 ENCOUNTER — Encounter: Payer: Self-pay | Admitting: Family Medicine

## 2020-03-03 ENCOUNTER — Ambulatory Visit (INDEPENDENT_AMBULATORY_CARE_PROVIDER_SITE_OTHER): Payer: Medicare Other

## 2020-03-03 VITALS — BP 135/77 | HR 89 | Temp 97.7°F | Ht 63.0 in | Wt 144.0 lb

## 2020-03-03 DIAGNOSIS — M1711 Unilateral primary osteoarthritis, right knee: Secondary | ICD-10-CM | POA: Diagnosis not present

## 2020-03-03 DIAGNOSIS — G8929 Other chronic pain: Secondary | ICD-10-CM

## 2020-03-03 DIAGNOSIS — I10 Essential (primary) hypertension: Secondary | ICD-10-CM

## 2020-03-03 DIAGNOSIS — M25561 Pain in right knee: Secondary | ICD-10-CM

## 2020-03-03 NOTE — Progress Notes (Addendum)
Chief Complaint  Patient presents with  . Knee Pain    Right, No injury    HPI  Patient presents today for severe pain in the right knee from arthritis. She needs a knee injection.  She is also followed for HTN. She denies chest pain recently  PMH: Smoking status noted ROS: Per HPI  Objective: BP 135/77   Pulse 89   Temp 97.7 F (36.5 C) (Temporal)   Ht 5\' 3"  (1.6 m)   Wt 144 lb (65.3 kg)   LMP 05/14/2001 (Approximate)   BMI 25.51 kg/m  Gen: NAD, alert, cooperative with exam HEENT: NCAT, EOMI, PERRL CV: RRR, good S1/S2, no murmur Resp: CTABL, no wheezes, non-labored Abd: SNTND, BS present, no guarding or organomegaly Ext: No edema, warm Neuro: Alert and oriented, No gross deficits  Assessment and plan:  1. Chronic pain of right knee   2. Primary hypertension     After obtaining informed consent, A steroid injection was performed at the right  knee. The lateral compartment was cleansed with betadine. Local anesthesia with 2% lidocain at lower lateral compartment. With 1.5 inch, 23 gauge needle using 2.5 ml marcan and 9 mg of Celestone the joint space was entered and injected. This was well tolerated. Simple dressing applied.  Continue BP, cholesterol meds as is.  Orders Placed This Encounter  Procedures  . DG Knee 1-2 Views Right    Order Specific Question:   Reason for Exam (SYMPTOM  OR DIAGNOSIS REQUIRED)    Answer:   chronic right knee pain    Order Specific Question:   Preferred imaging location?    Answer:   Internal     Follow up as needed.  Claretta Fraise, MD

## 2020-03-06 ENCOUNTER — Encounter: Payer: Self-pay | Admitting: Family Medicine

## 2020-04-11 ENCOUNTER — Other Ambulatory Visit: Payer: Self-pay | Admitting: Family Medicine

## 2020-04-11 DIAGNOSIS — F419 Anxiety disorder, unspecified: Secondary | ICD-10-CM

## 2020-04-19 ENCOUNTER — Encounter: Payer: Self-pay | Admitting: Family Medicine

## 2020-04-19 ENCOUNTER — Ambulatory Visit (INDEPENDENT_AMBULATORY_CARE_PROVIDER_SITE_OTHER): Payer: Medicare Other | Admitting: Family Medicine

## 2020-04-19 ENCOUNTER — Other Ambulatory Visit: Payer: Self-pay

## 2020-04-19 VITALS — BP 157/80 | HR 78 | Temp 98.4°F | Ht 63.0 in | Wt 148.0 lb

## 2020-04-19 DIAGNOSIS — F419 Anxiety disorder, unspecified: Secondary | ICD-10-CM

## 2020-04-19 DIAGNOSIS — F411 Generalized anxiety disorder: Secondary | ICD-10-CM

## 2020-04-19 DIAGNOSIS — E039 Hypothyroidism, unspecified: Secondary | ICD-10-CM | POA: Diagnosis not present

## 2020-04-19 DIAGNOSIS — E7800 Pure hypercholesterolemia, unspecified: Secondary | ICD-10-CM | POA: Diagnosis not present

## 2020-04-19 DIAGNOSIS — I10 Essential (primary) hypertension: Secondary | ICD-10-CM

## 2020-04-19 LAB — LIPID PANEL

## 2020-04-19 MED ORDER — LOSARTAN POTASSIUM 25 MG PO TABS: 25.0000 mg | ORAL_TABLET | Freq: Every day | ORAL | 3 refills | Status: DC

## 2020-04-19 MED ORDER — ALPRAZOLAM 0.25 MG PO TABS: 0.2500 mg | ORAL_TABLET | Freq: Every evening | ORAL | 1 refills | Status: DC | PRN

## 2020-04-19 MED ORDER — PRAVASTATIN SODIUM 40 MG PO TABS
40.0000 mg | ORAL_TABLET | Freq: Every day | ORAL | 3 refills | Status: DC
Start: 2020-04-19 — End: 2021-04-19

## 2020-04-19 MED ORDER — VENLAFAXINE HCL ER 75 MG PO CP24: ORAL_CAPSULE | ORAL | 3 refills | Status: DC

## 2020-04-19 MED ORDER — OMEPRAZOLE 40 MG PO CPDR
40.0000 mg | DELAYED_RELEASE_CAPSULE | Freq: Every day | ORAL | 3 refills | Status: DC
Start: 2020-04-19 — End: 2021-04-19

## 2020-04-19 MED ORDER — ATENOLOL 25 MG PO TABS: ORAL_TABLET | ORAL | 3 refills | Status: DC

## 2020-04-19 NOTE — Progress Notes (Signed)
BP (!) 154/80   Pulse 78   Temp 98.4 F (36.9 C)   Ht _0  (1.6 m)   Wt 148 lb (67.1 kg)   LMP 05/14/2001 (Approximate)   SpO2 98%   BMI 26.22 kg/m    Subjective:   Patient ID: Shelly Nelson, female    DOB: 1930-01-05, 86 y.o.   MRN: [id number]  HPI: Shelly Nelson is a 86 y.o. female presenting on 04/19/2020 for Medical Management of Chronic Issues, Hypertension, and Hypothyroidism   HPI Hypothyroidism recheck Patient is coming in for thyroid recheck today as well. They deny any issues with hair changes or heat or cold problems or diarrhea or constipation. They deny any chest pain or palpitations. They are currently on no medication because has been subclinical, will check again  Hypertension Patient is currently on atenolol and losartan, and their blood pressure today is 154/80, at home she is running between the 120s and 160s consistently throughout the day.. Patient denies any lightheadedness or dizziness. Patient denies headaches, blurred vision, chest pains, shortness of breath, or weakness. Denies any side effects from medication and is content with current medication.   Hyperlipidemia Patient is coming in for recheck of his hyperlipidemia. The patient is currently taking fish oil and pravastatin. They deny any issues with myalgias or history of liver damage from it. They deny any focal numbness or weakness or chest pain.   Anxiety recheck Patient is coming in today for anxiety recheck.  She uses the occasional alprazolam very infrequently, her last prescription for 30 was 10 months ago.  We will do refill.  She mostly manages with Effexor.  Patient complains of right knee pain which is from osteoarthritis, she says she has had injections before and has seen orthopedics.  She is not ready for knee replacement takes occasional Advil, recommended with food.  Relevant past medical, surgical, family and social history reviewed and updated as indicated. Interim  medical history since our last visit reviewed. Allergies and medications reviewed and updated.  Review of Systems  Constitutional: Negative for chills and fever.  Eyes: Negative for visual disturbance.  Respiratory: Negative for chest tightness and shortness of breath.   Cardiovascular: Negative for chest pain and leg swelling.  Genitourinary: Negative for difficulty urinating and dysuria.  Musculoskeletal: Negative for back pain and gait problem.  Skin: Negative for rash.  Neurological: Negative for light-headedness and headaches.  Psychiatric/Behavioral: Negative for agitation and behavioral problems. The patient is nervous/anxious.   All other systems reviewed and are negative.   Per HPI unless specifically indicated above   Allergies as of 04/19/2020      Reactions   Cephalexin Hives      Medication List       Accurate as of April 19, 2020  8:34 AM. If you have any questions, ask your nurse or doctor.        ALPRAZolam 0.25 MG tablet Commonly known as: XANAX Take 1 tablet (0.25 mg total) by mouth at bedtime as needed for anxiety.   atenolol 25 MG tablet Commonly known as: TENORMIN Take 1/2 (one-half) tablet by mouth once daily   Calcium-D 600-400 MG-UNIT Tabs Take 1 tablet by mouth daily.   conjugated estrogens vaginal cream Commonly known as: Premarin Use 0.5 grams vaginally 2 x a week at hs.   DULCOLAX 100 MG capsule Generic drug: docusate sodium Take 100 mg by mouth daily.   Fish Oil 1200 MG Caps Take 1,200 mg by mouth daily.  folic acid 597 MCG tablet Commonly known as: FOLVITE Take 800 mcg by mouth daily.   Garlic 4163 MG Caps Take 1 capsule by mouth daily.   loratadine 10 MG tablet Commonly known as: CLARITIN Take 10 mg by mouth daily as needed.   losartan 25 MG tablet Commonly known as: COZAAR Take 1 tablet by mouth once daily   magnesium oxide 400 MG tablet Commonly known as: MAG-OX Take 400 mg by mouth 2 (two) times daily.    multivitamin with minerals Tabs tablet Take 1 tablet by mouth daily.   omeprazole 40 MG capsule Commonly known as: PRILOSEC Take 1 capsule by mouth once daily   pravastatin 40 MG tablet Commonly known as: PRAVACHOL Take 1 tablet by mouth once daily   psyllium 58.6 % powder Commonly known as: METAMUCIL Take 1 packet by mouth 2 (two) times daily.   venlafaxine XR 75 MG 24 hr capsule Commonly known as: EFFEXOR-XR TAKE 1 CAPSULE BY MOUTH ONCE DAILY WITH BREAKFAST        Objective:   BP (!) 154/80   Pulse 78   Temp 98.4 F (36.9 C)   Ht _0  (1.6 m)   Wt 148 lb (67.1 kg)   LMP 05/14/2001 (Approximate)   SpO2 98%   BMI 26.22 kg/m   Wt Readings from Last 3 Encounters:  04/19/20 148 lb (67.1 kg)  03/03/20 144 lb (65.3 kg)  10/16/19 145 lb (65.8 kg)    Physical Exam Vitals and nursing note reviewed.  Constitutional:      General: She is not in acute distress.    Appearance: She is well-developed. She is not diaphoretic.  Eyes:     Conjunctiva/sclera: Conjunctivae normal.  Cardiovascular:     Rate and Rhythm: Normal rate and regular rhythm.     Heart sounds: Normal heart sounds. No murmur heard.   Pulmonary:     Effort: Pulmonary effort is normal. No respiratory distress.     Breath sounds: Normal breath sounds. No wheezing.  Musculoskeletal:        General: No tenderness. Normal range of motion.  Skin:    General: Skin is warm and dry.     Findings: No rash.  Neurological:     Mental Status: She is alert and oriented to person, place, and time.     Coordination: Coordination normal.  Psychiatric:        Behavior: Behavior normal.       Assessment & Plan:   Problem List Items Addressed This Visit      Cardiovascular and Mediastinum   HTN (hypertension)   Relevant Medications   atenolol (TENORMIN) 25 MG tablet   losartan (COZAAR) 25 MG tablet   pravastatin (PRAVACHOL) 40 MG tablet   Other Relevant Orders   CMP14+EGFR   Lipid panel      Endocrine   Hypothyroid - Primary   Relevant Medications   atenolol (TENORMIN) 25 MG tablet   Other Relevant Orders   TSH     Other   HLD (hyperlipidemia)   Relevant Medications   atenolol (TENORMIN) 25 MG tablet   losartan (COZAAR) 25 MG tablet   pravastatin (PRAVACHOL) 40 MG tablet   Other Relevant Orders   Lipid panel   Anxiety   Relevant Medications   venlafaxine XR (EFFEXOR-XR) 75 MG 24 hr capsule   ALPRAZolam (XANAX) 0.25 MG tablet   Other Relevant Orders   CBC with Differential/Platelet   TSH    Other Visit Diagnoses  Essential hypertension       Relevant Medications   atenolol (TENORMIN) 25 MG tablet   losartan (COZAAR) 25 MG tablet   pravastatin (PRAVACHOL) 40 MG tablet   Other Relevant Orders   CMP14+EGFR   Generalized anxiety disorder       Relevant Medications   venlafaxine XR (EFFEXOR-XR) 75 MG 24 hr capsule   ALPRAZolam (XANAX) 0.25 MG tablet   Other Relevant Orders   CBC with Differential/Platelet      Continue current medication, allowing for permissive hypertension because we do not want her to have any falls.  She will continue to work with orthopedic for her knees. Follow up plan: Return in about 6 months (around 10/18/2020), or if symptoms worsen or fail to improve, for Hypertension and hypothyroid and hyperlipidemia and anxiety.  Counseling provided for all of the vaccine components No orders of the defined types were placed in this encounter.   Caryl Pina, MD [facility] 04/19/2020, 8:34 AM

## 2020-04-20 LAB — CBC WITH DIFFERENTIAL/PLATELET
Basophils Absolute: 0.1 10*3/uL (ref 0.0–0.2)
Basos: 1 %
EOS (ABSOLUTE): 0.4 10*3/uL (ref 0.0–0.4)
Eos: 6 %
Hematocrit: 37.2 % (ref 34.0–46.6)
Hemoglobin: 12.2 g/dL (ref 11.1–15.9)
Immature Grans (Abs): 0 10*3/uL (ref 0.0–0.1)
Immature Granulocytes: 0 %
Lymphocytes Absolute: 2 10*3/uL (ref 0.7–3.1)
Lymphs: 30 %
MCH: 30.3 pg (ref 26.6–33.0)
MCHC: 32.8 g/dL (ref 31.5–35.7)
MCV: 92 fL (ref 79–97)
Monocytes Absolute: 0.6 10*3/uL (ref 0.1–0.9)
Monocytes: 9 %
Neutrophils Absolute: 3.5 10*3/uL (ref 1.4–7.0)
Neutrophils: 54 %
Platelets: 268 10*3/uL (ref 150–450)
RBC: 4.03 x10E6/uL (ref 3.77–5.28)
RDW: 12.4 % (ref 11.7–15.4)
WBC: 6.6 10*3/uL (ref 3.4–10.8)

## 2020-04-20 LAB — LIPID PANEL
Chol/HDL Ratio: 2.7 ratio (ref 0.0–4.4)
Cholesterol, Total: 203 mg/dL — ABNORMAL HIGH (ref 100–199)
HDL: 76 mg/dL (ref >39)
LDL Chol Calc (NIH): 105 mg/dL — ABNORMAL HIGH (ref 0–99)
Triglycerides: 127 mg/dL (ref 0–149)
VLDL Cholesterol Cal: 22 mg/dL (ref 5–40)

## 2020-04-20 LAB — CMP14+EGFR
ALT: 23 IU/L (ref 0–32)
AST: 40 IU/L (ref 0–40)
Albumin/Globulin Ratio: 2.4 — ABNORMAL HIGH (ref 1.2–2.2)
Albumin: 4.7 g/dL — ABNORMAL HIGH (ref 3.6–4.6)
Alkaline Phosphatase: 72 IU/L (ref 44–121)
BUN/Creatinine Ratio: 13 (ref 12–28)
BUN: 14 mg/dL (ref 8–27)
Bilirubin Total: 0.3 mg/dL (ref 0.0–1.2)
CO2: 26 mmol/L (ref 20–29)
Calcium: 9.7 mg/dL (ref 8.7–10.3)
Chloride: 100 mmol/L (ref 96–106)
Creatinine, Ser: 1.05 mg/dL — ABNORMAL HIGH (ref 0.57–1.00)
GFR calc Af Amer: 55 mL/min/{1.73_m2} — ABNORMAL LOW (ref >59)
GFR calc non Af Amer: 48 mL/min/{1.73_m2} — ABNORMAL LOW (ref >59)
Globulin, Total: 2 g/dL (ref 1.5–4.5)
Glucose: 100 mg/dL — ABNORMAL HIGH (ref 65–99)
Potassium: 4.9 mmol/L (ref 3.5–5.2)
Sodium: 139 mmol/L (ref 134–144)
Total Protein: 6.7 g/dL (ref 6.0–8.5)

## 2020-04-20 LAB — TSH: TSH: 2.61 u[IU]/mL (ref 0.450–4.500)

## 2020-05-23 ENCOUNTER — Other Ambulatory Visit: Payer: Self-pay | Admitting: Family Medicine

## 2020-06-14 ENCOUNTER — Other Ambulatory Visit: Payer: Self-pay | Admitting: Family Medicine

## 2020-06-14 DIAGNOSIS — N952 Postmenopausal atrophic vaginitis: Secondary | ICD-10-CM

## 2020-06-21 DIAGNOSIS — M5416 Radiculopathy, lumbar region: Secondary | ICD-10-CM | POA: Diagnosis not present

## 2020-06-21 DIAGNOSIS — M419 Scoliosis, unspecified: Secondary | ICD-10-CM | POA: Diagnosis not present

## 2020-06-21 DIAGNOSIS — M5136 Other intervertebral disc degeneration, lumbar region: Secondary | ICD-10-CM | POA: Diagnosis not present

## 2020-07-19 DIAGNOSIS — M5416 Radiculopathy, lumbar region: Secondary | ICD-10-CM | POA: Diagnosis not present

## 2020-07-25 DIAGNOSIS — M545 Low back pain, unspecified: Secondary | ICD-10-CM | POA: Diagnosis not present

## 2020-08-08 DIAGNOSIS — M79661 Pain in right lower leg: Secondary | ICD-10-CM | POA: Diagnosis not present

## 2020-08-08 DIAGNOSIS — M79662 Pain in left lower leg: Secondary | ICD-10-CM | POA: Diagnosis not present

## 2020-08-08 DIAGNOSIS — M5416 Radiculopathy, lumbar region: Secondary | ICD-10-CM | POA: Diagnosis not present

## 2020-08-09 ENCOUNTER — Encounter (HOSPITAL_COMMUNITY): Payer: Medicare Other

## 2020-08-09 ENCOUNTER — Other Ambulatory Visit (HOSPITAL_COMMUNITY): Payer: Self-pay | Admitting: Chiropractic Medicine

## 2020-08-09 DIAGNOSIS — R2241 Localized swelling, mass and lump, right lower limb: Secondary | ICD-10-CM

## 2020-08-10 ENCOUNTER — Other Ambulatory Visit: Payer: Self-pay

## 2020-08-10 ENCOUNTER — Ambulatory Visit (HOSPITAL_COMMUNITY)
Admission: RE | Admit: 2020-08-10 | Discharge: 2020-08-10 | Disposition: A | Discharge status: Home / Self Care | Payer: Medicare Other | Source: Ambulatory Visit | Attending: Physical Medicine and Rehabilitation | Admitting: Physical Medicine and Rehabilitation

## 2020-08-10 DIAGNOSIS — R2241 Localized swelling, mass and lump, right lower limb: Secondary | ICD-10-CM | POA: Diagnosis not present

## 2020-08-18 ENCOUNTER — Other Ambulatory Visit: Payer: Self-pay

## 2020-08-18 ENCOUNTER — Ambulatory Visit (INDEPENDENT_AMBULATORY_CARE_PROVIDER_SITE_OTHER): Payer: Medicare Other | Admitting: Nurse Practitioner

## 2020-08-18 ENCOUNTER — Encounter: Payer: Self-pay | Admitting: Nurse Practitioner

## 2020-08-18 VITALS — BP 151/73 | HR 84 | Temp 98.1°F | Resp 20 | Ht 63.0 in | Wt 150.0 lb

## 2020-08-18 DIAGNOSIS — I80201 Phlebitis and thrombophlebitis of unspecified deep vessels of right lower extremity: Secondary | ICD-10-CM | POA: Diagnosis not present

## 2020-08-18 MED ORDER — SULFAMETHOXAZOLE-TRIMETHOPRIM 800-160 MG PO TABS: 1.0000 | ORAL_TABLET | Freq: Two times a day (BID) | ORAL | 0 refills | Status: DC

## 2020-08-18 MED ORDER — FUROSEMIDE 20 MG PO TABS: 20.0000 mg | ORAL_TABLET | Freq: Every day | ORAL | 3 refills | Status: DC

## 2020-08-18 NOTE — Patient Instructions (Signed)
Phlebitis Phlebitis is soreness and swelling (inflammation) of a vein. In most cases, this condition is not serious and gets better quickly. What are the causes? This condition may be caused by:  Having any of these put in the vein: ? A needle. ? An IV tube. ? A long, thin tube called a catheter.  Getting certain IV medicines or solutions that can irritate the vein.  Having an IV tube for a long time or in a part of the body that moves a lot.  A blood clot.  Infection of the vein.  Surgery on a vein. What increases the risk?  Being overweight or very overweight (obese).  Pregnancy.  Cancer.  Reduced or slowing of blood flow through your veins. This can be caused by: ? Bed rest over a long period of time. ? Long-distance travel. ? Injury. ? Surgery. ? Congestive heart failure. ? Inactivity.  Smoking.  Taking birth control pills or hormone replacement therapy.  Varicose veins.  Diseases or blood disorders that increase clotting.  Taking drugs through the vein.  Having a history of blood clots. What are the signs or symptoms?  A red, tender, swollen, and painful area on your skin. Usually, the area is long and narrow. It may spread.  The affected area feeling warm to the touch.  Firmness along the center of the affected area.  Low fever. How is this treated? Treatment may include:  Using a heating pad or other heat source.  Wearing stockings or bandages that put pressure (compression) on the area.  Taking medicines.  Removing an IV tube.  Changing a medicine or IV solution that is causing the problem. In rare cases, surgery may be needed. Follow these instructions at home: Managing pain, stiffness, and swelling  If told, put heat on the affected area. Do this as often as told by your doctor. Use the heat source that your doctor recommends, such as a moist heat pack or a heating pad. ? Place a towel between your skin and the heat source. ? Leave  the heat on for 20-30 minutes. ? Take off the heat if your skin turns bright red. This is very important. If you cannot feel pain, heat, or cold, you have a greater risk of getting burned.  Raise the affected area above the level of your heart while you are sitting or lying down.   Medicines  Take over-the-counter and prescription medicines only as told by your doctor.  If you were prescribed an antibiotic medicine, take it as told by your doctor. Do not stop taking it even if you start to feel better.  If you are taking blood thinners: ? Talk with your doctor before taking any medicines that have aspirin or NSAIDs, such as ibuprofen. ? Take medicines exactly as told. Take them at the same time each day. ? Avoid doing things that could hurt or bruise you. Take action to prevent falls. ? Wear an alert bracelet or carry a card that shows you are taking blood thinners. General instructions  If you have phlebitis in your legs: ? Do not sit or lie down for a long time. ? Keep your legs moving. ? Get up and take short walks if you have to sit for a long time. ? Try to avoid bed rest that lasts for a long time. Regular sleep is not bed rest.  Wear compression stockings or bandages as told by your doctor. These stockings help: ? To reduce swelling in your legs. ?  To prevent blood clots. ? To stop the condition from coming back.  Do not smoke or use any products that contain nicotine or tobacco. If you need help quitting, ask your doctor.  Keep all follow-up visits. Contact a doctor if:  You have strange bruises.  You have bleeding problems.  Your symptoms do not get better.  Your symptoms get worse.  You are taking medicine to treat swelling (anti-inflammatory medicine) and you get pain in your belly (abdomen). Get help right away if:  You have sudden chest pain.  You suddenly have trouble breathing.  You have a fever and your symptoms get worse.  You cough up blood.  You  feel dizzy or you faint.  You have very bad pain and swelling in the affected arm or leg. These symptoms may be an emergency. Get help right away. Call your local emergency services (911 in the [place]  Do not wait to see if the symptoms will go away.  Do not drive yourself to the hospital. Summary  Phlebitis is soreness and swelling (inflammation) of a vein.  Raise the affected area above the level of your heart while you are sitting or lying down.  If told, put heat on the affected area. Do this as often as told by your doctor.  Take over-the-counter and prescription medicines only as told by your doctor. This information is not intended to replace advice given to you by your health care provider. Make sure you discuss any questions you have with your health care provider. Document Revised: 12/28/2019 Document Reviewed: 12/28/2019 Elsevier Patient Education  2021 [employer].

## 2020-08-18 NOTE — Progress Notes (Signed)
   Subjective:    Patient ID: Shelly Nelson, female    DOB: Jul 11, 1929, 87 y.o.   MRN: [id number]   Chief Complaint: Right leg and foot swollen   HPI Patient comes in c/o swelling of right fooot and ankle. Started 6 weeks ago. She had a doppler study done last week which showed no DVT. She says the swelling is constant in foot and ankle with swelling in calf area as well.   Review of Systems  Constitutional: Negative.   Respiratory: Negative for shortness of breath.   Cardiovascular: Positive for leg swelling (right only). Negative for chest pain and palpitations.  All other systems reviewed and are negative.      Objective:   Physical Exam Constitutional:      Appearance: Normal appearance.  Cardiovascular:     Rate and Rhythm: Normal rate and regular rhythm.     Pulses: Normal pulses.     Heart sounds: Normal heart sounds.  Pulmonary:     Effort: Pulmonary effort is normal.     Breath sounds: Normal breath sounds.  Skin:    General: Skin is warm.     Comments: rightb lower leg erythematous and hot to touch Mld edema of foot , ankle and calf (-) homan sign.  Neurological:     General: No focal deficit present.     Mental Status: She is alert and oriented to person, place, and time.     BP (!) 151/73   Pulse 84   Temp 98.1 F (36.7 C) (Temporal)   Resp 20   Ht 5\' 3"  (1.6 m)   Wt 150 lb (68 kg)   LMP 05/14/2001 (Approximate)   SpO2 96%   BMI 26.57 kg/m        Assessment & Plan:  Shelly Nelson in today with chief complaint of Right leg and foot swollen   1. Phlebitis of deep vein of calf, right (HCC) Elevate when sitting Cool compresses If not resolving RTO  - furosemide (LASIX) 20 MG tablet; Take 1 tablet (20 mg total) by mouth daily.  Dispense: 30 tablet; Refill: 3 - sulfamethoxazole-trimethoprim (BACTRIM DS) 800-160 MG tablet; Take 1 tablet by mouth 2 (two) times daily.  Dispense: 20 tablet; Refill: 0    The above assessment and  management plan was discussed with the patient. The patient verbalized understanding of and has agreed to the management plan. Patient is aware to call the clinic if symptoms persist or worsen. Patient is aware when to return to the clinic for a follow-up visit. Patient educated on when it is appropriate to go to the emergency department.   Shelly Nelson Done, FNP

## 2020-08-19 IMAGING — MG DIGITAL SCREENING BILAT W/ TOMO W/ CAD
8 series · 9 of 24 positions shown · non-contrast
Comparison: Previous exam(s).

CLINICAL DATA: Screening.

EXAM:
DIGITAL SCREENING BILATERAL MAMMOGRAM WITH TOMO AND CAD

[L CC synth-2D]
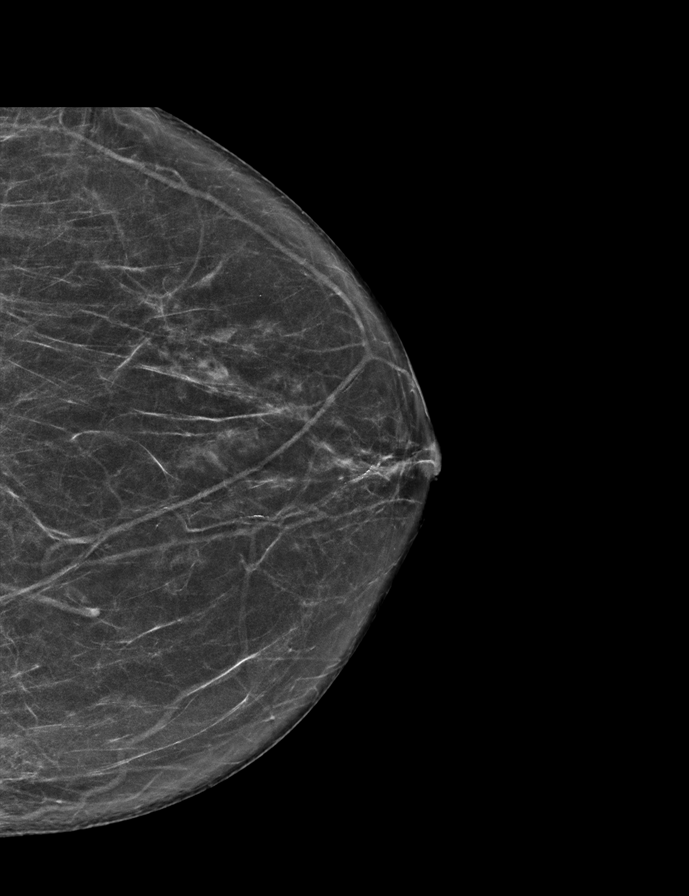

[L MLO synth-2D]
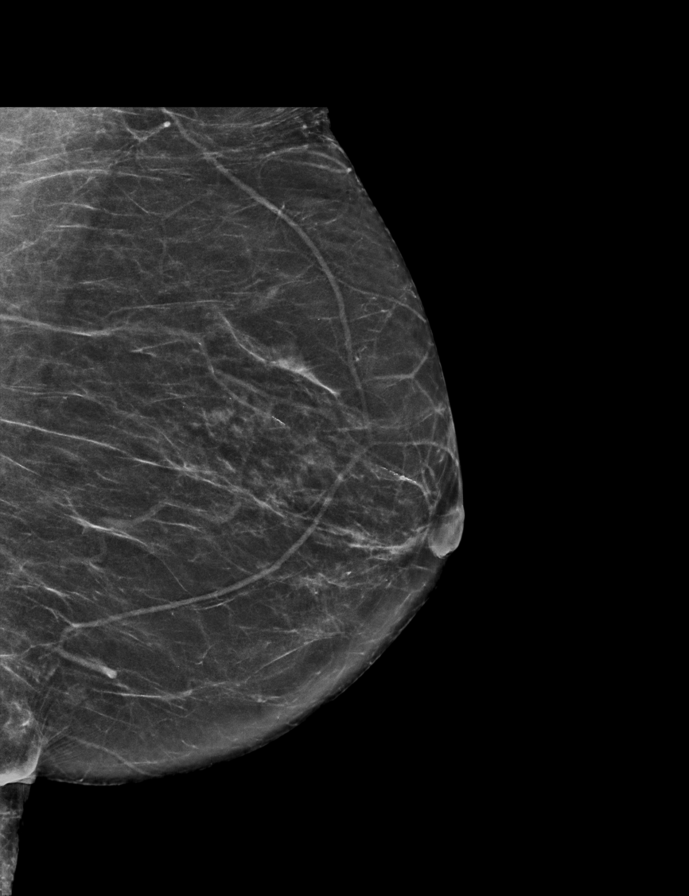

[R CC synth-2D]
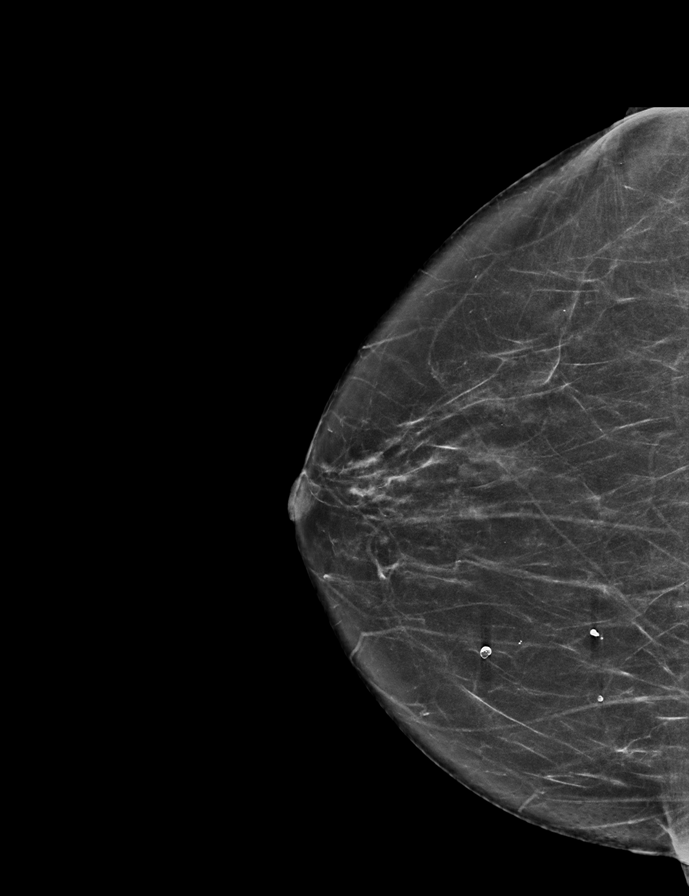

[R MLO synth-2D]
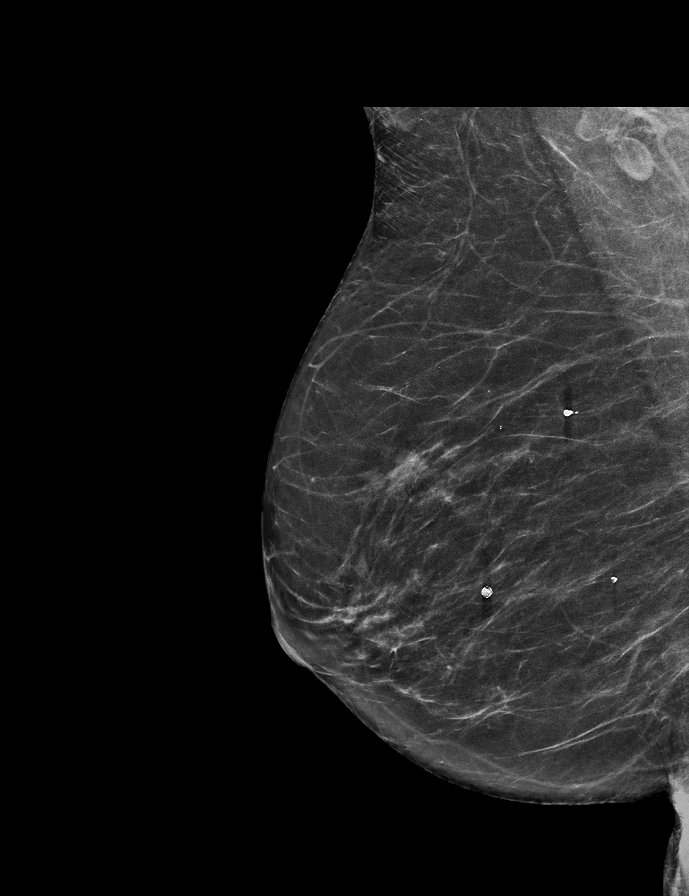

[L CC tomo · 2 of 59 frames shown]
[frame 20/59]
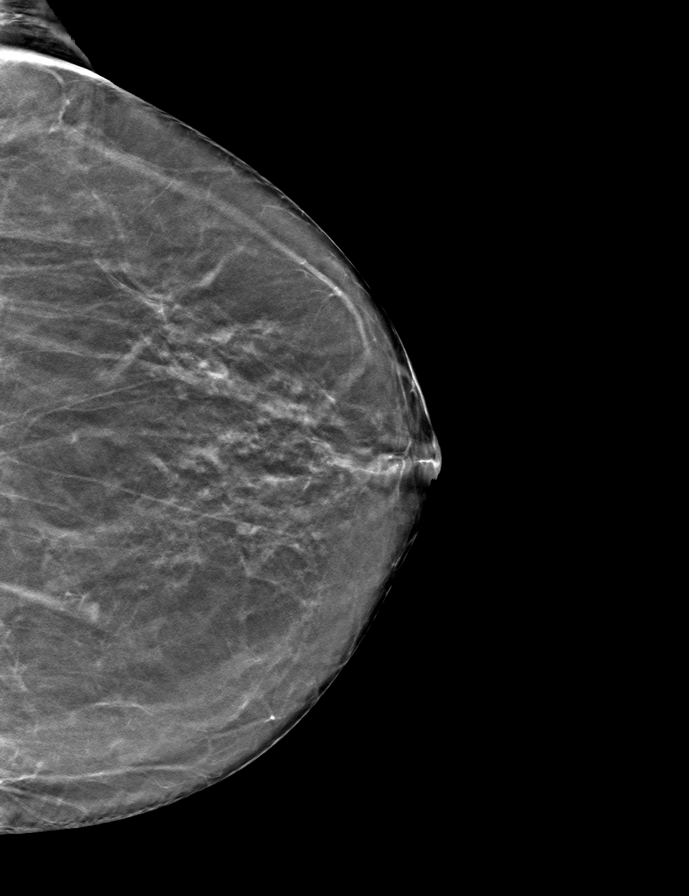
[frame 30/59]
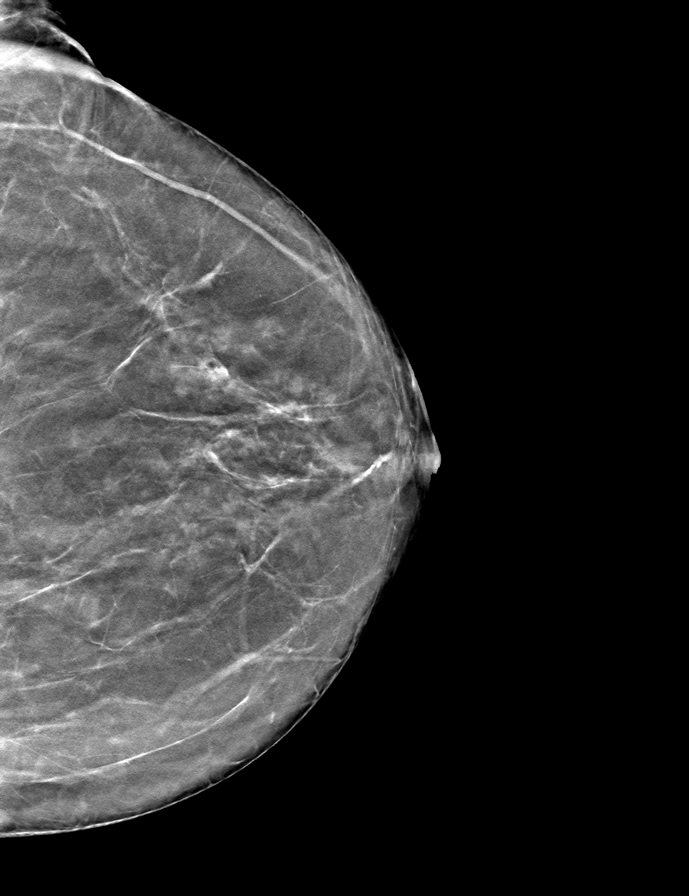

[R CC tomo · tomo slice 31/61.0]
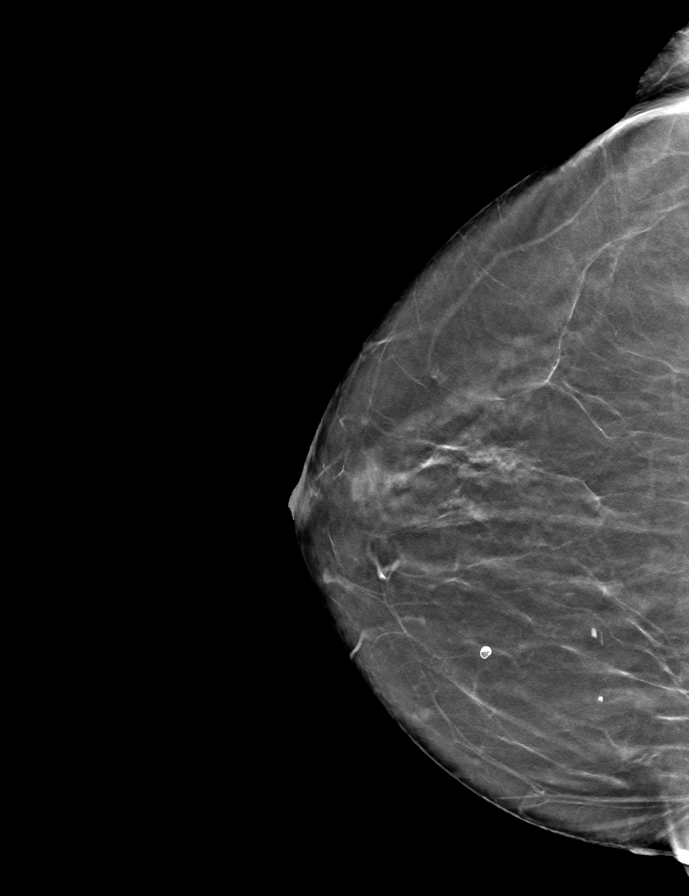

[L MLO tomo · tomo slice 29/56.0]
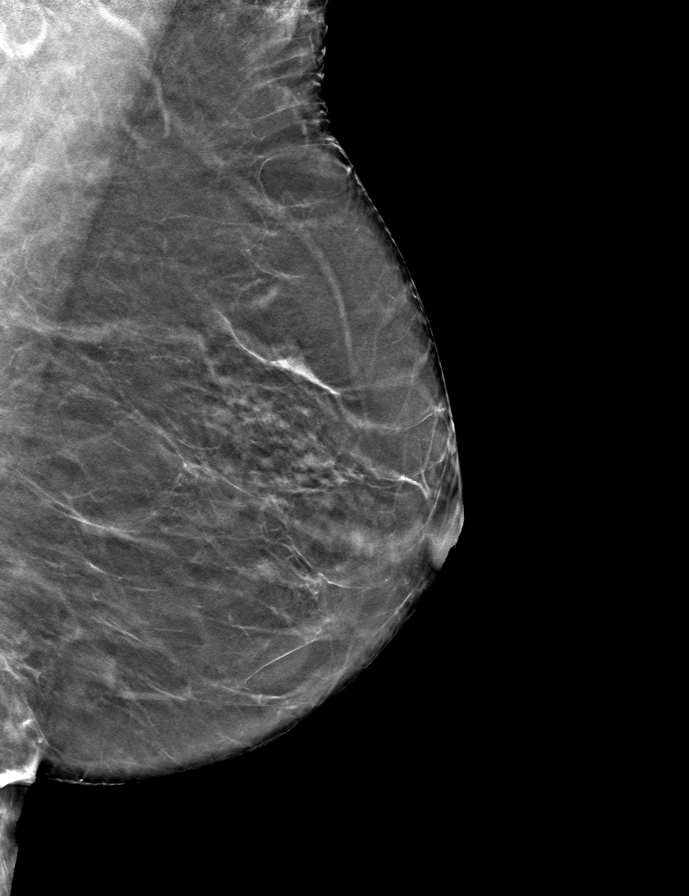

[R MLO tomo · tomo slice 29/58.0]
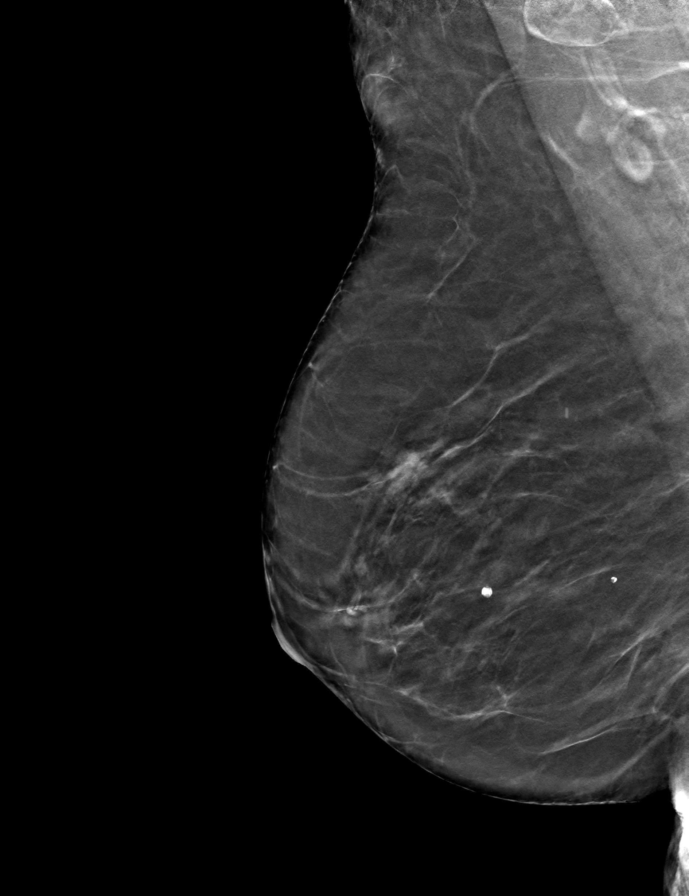

[9 of 24 positions shown; findings below may reference images not displayed]

ACR Breast Density Category b: There are scattered areas of
fibroglandular density.
FINDINGS: In the left breast, a possible asymmetry warrants further
evaluation. In the right breast, no findings suspicious for
malignancy. Images were processed with CAD.
IMPRESSION: Further evaluation is suggested for possible asymmetry in the left
breast.

RECOMMENDATION:
Diagnostic mammogram and possibly ultrasound of the left breast.
(Code:DI-5-LL4)

The patient will be contacted regarding the findings, and additional
imaging will be scheduled.

BI-RADS CATEGORY  0: Incomplete. Need additional imaging evaluation
and/or prior mammograms for comparison.

## 2020-08-26 ENCOUNTER — Other Ambulatory Visit: Payer: Self-pay | Admitting: Family Medicine

## 2020-08-26 DIAGNOSIS — Z1231 Encounter for screening mammogram for malignant neoplasm of breast: Secondary | ICD-10-CM

## 2020-09-01 DIAGNOSIS — M5416 Radiculopathy, lumbar region: Secondary | ICD-10-CM | POA: Diagnosis not present

## 2020-09-14 DIAGNOSIS — Z23 Encounter for immunization: Secondary | ICD-10-CM | POA: Diagnosis not present

## 2020-09-16 DIAGNOSIS — M5416 Radiculopathy, lumbar region: Secondary | ICD-10-CM | POA: Diagnosis not present

## 2020-10-17 ENCOUNTER — Ambulatory Visit: Payer: Medicare Other

## 2020-10-18 ENCOUNTER — Ambulatory Visit
Admission: RE | Admit: 2020-10-18 | Discharge: 2020-10-18 | Disposition: A | Discharge status: Home / Self Care | Payer: Medicare Other | Source: Ambulatory Visit | Attending: Family Medicine | Admitting: Family Medicine

## 2020-10-18 ENCOUNTER — Other Ambulatory Visit: Payer: Self-pay

## 2020-10-18 DIAGNOSIS — Z1231 Encounter for screening mammogram for malignant neoplasm of breast: Secondary | ICD-10-CM | POA: Diagnosis not present

## 2020-10-19 ENCOUNTER — Ambulatory Visit (INDEPENDENT_AMBULATORY_CARE_PROVIDER_SITE_OTHER): Payer: Medicare Other | Admitting: Family Medicine

## 2020-10-19 ENCOUNTER — Encounter: Payer: Self-pay | Admitting: Family Medicine

## 2020-10-19 VITALS — BP 132/66 | HR 73 | Ht 63.0 in | Wt 149.0 lb

## 2020-10-19 DIAGNOSIS — E7800 Pure hypercholesterolemia, unspecified: Secondary | ICD-10-CM | POA: Diagnosis not present

## 2020-10-19 DIAGNOSIS — I10 Essential (primary) hypertension: Secondary | ICD-10-CM

## 2020-10-19 DIAGNOSIS — F419 Anxiety disorder, unspecified: Secondary | ICD-10-CM

## 2020-10-19 DIAGNOSIS — Z23 Encounter for immunization: Secondary | ICD-10-CM | POA: Diagnosis not present

## 2020-10-19 DIAGNOSIS — E039 Hypothyroidism, unspecified: Secondary | ICD-10-CM

## 2020-10-19 MED ORDER — HYDROXYZINE PAMOATE 25 MG PO CAPS: 25.0000 mg | ORAL_CAPSULE | Freq: Every evening | ORAL | 1 refills | Status: DC | PRN

## 2020-10-19 NOTE — Addendum Note (Signed)
Addended by: Alphonzo Dublin on: 10/19/2020 09:23 AM   Modules accepted: Orders

## 2020-10-19 NOTE — Progress Notes (Signed)
BP 132/66   Pulse 73   Ht _0  (1.6 m)   Wt 149 lb (67.6 kg)   LMP 05/14/2001 (Approximate)   SpO2 99%   BMI 26.39 kg/m    Subjective:   Patient ID: Shelly Nelson, female    DOB: 1929/09/23, 87 y.o.   MRN: [id number]  HPI: Shelly Nelson is a 87 y.o. female presenting on 10/19/2020 for Medical Management of Chronic Issues, Hypothyroidism, and Hypertension   HPI Hypothyroidism recheck Patient is coming in for thyroid recheck today as well. They deny any issues with hair changes or heat or cold problems or diarrhea or constipation. They deny any chest pain or palpitations. They are currently on no medication currently but we have been monitoring that she has been borderline.  Hypertension Patient is currently on atenolol and furosemide as needed, and their blood pressure today is 132/66. Patient denies any lightheadedness or dizziness. Patient denies headaches, blurred vision, chest pains, shortness of breath, or weakness. Denies any side effects from medication and is content with current medication.   Hyperlipidemia Patient is coming in for recheck of his hyperlipidemia. The patient is currently taking pravastatin and fish oil. They deny any issues with myalgias or history of liver damage from it. They deny any focal numbness or weakness or chest pain.   Anxiety recheck Patient is coming in today for anxiety recheck.  Patient has been taking Effexor and also the occasional Xanax but now is on pain medicines we discussed difficulty in combining those.  She only took the Xanax every now and then and not very frequently, will switch to hydroxyzine for her.  Depression screen [facility] 2/9 10/19/2020 08/18/2020 04/19/2020 03/03/2020 12/09/2019  Decreased Interest 0 0 0 0 0  Down, Depressed, Hopeless 0 0 0 0 0  PHQ - 2 Score 0 0 0 0 0     Relevant past medical, surgical, family and social history reviewed and updated as indicated. Interim medical history since our last visit  reviewed. Allergies and medications reviewed and updated.  Review of Systems  Constitutional: Negative for chills and fever.  Eyes: Negative for visual disturbance.  Respiratory: Negative for chest tightness and shortness of breath.   Cardiovascular: Negative for chest pain and leg swelling.  Musculoskeletal: Positive for arthralgias and back pain. Negative for gait problem.  Skin: Negative for rash.  Neurological: Negative for light-headedness and headaches.  Psychiatric/Behavioral: Negative for agitation and behavioral problems. The patient is nervous/anxious.   All other systems reviewed and are negative.   Per HPI unless specifically indicated above   Allergies as of 10/19/2020      Reactions   Cephalexin Hives      Medication List       Accurate as of October 19, 2020  8:48 AM. If you have any questions, ask your nurse or doctor.        STOP taking these medications   losartan 25 MG tablet Commonly known as: COZAAR   sulfamethoxazole-trimethoprim 800-160 MG tablet Commonly known as: Bactrim DS     TAKE these medications   ALPRAZolam 0.25 MG tablet Commonly known as: XANAX Take 1 tablet (0.25 mg total) by mouth at bedtime as needed for anxiety.   atenolol 25 MG tablet Commonly known as: TENORMIN 1/2 tablet daily   Calcium-D 600-400 MG-UNIT Tabs Take 1 tablet by mouth daily.   docusate sodium 100 MG capsule Commonly known as: COLACE Take 100 mg by mouth daily.   Fish Oil 1200 MG  Caps Take 1,200 mg by mouth daily.   folic acid 883 MCG tablet Commonly known as: FOLVITE Take 800 mcg by mouth daily.   furosemide 20 MG tablet Commonly known as: LASIX Take 1 tablet (20 mg total) by mouth daily.   Garlic 2549 MG Caps Take 1 capsule by mouth daily.   HYDROcodone-acetaminophen 10-325 MG tablet Commonly known as: NORCO Take 1 tablet by mouth 3 (three) times daily as needed.   loratadine 10 MG tablet Commonly known as: CLARITIN Take 10 mg by mouth daily as  needed.   magnesium oxide 400 MG tablet Commonly known as: MAG-OX Take 400 mg by mouth 2 (two) times daily.   multivitamin with minerals Tabs tablet Take 1 tablet by mouth daily.   omeprazole 40 MG capsule Commonly known as: PRILOSEC Take 1 capsule (40 mg total) by mouth daily.   polyethylene glycol 17 g packet Commonly known as: MIRALAX / GLYCOLAX Take 17 g by mouth daily.   pravastatin 40 MG tablet Commonly known as: PRAVACHOL Take 1 tablet (40 mg total) by mouth daily.   Premarin vaginal cream Generic drug: conjugated estrogens INSERT 0.5 GRAMS VAGINALLY TWICE WEEKLY AT BEDTIME   psyllium 58.6 % powder Commonly known as: METAMUCIL Take 1 packet by mouth 2 (two) times daily.   venlafaxine XR 75 MG 24 hr capsule Commonly known as: EFFEXOR-XR Take one capsule daily with breakfast        Objective:   BP 132/66   Pulse 73   Ht _0  (1.6 m)   Wt 149 lb (67.6 kg)   LMP 05/14/2001 (Approximate)   SpO2 99%   BMI 26.39 kg/m   Wt Readings from Last 3 Encounters:  10/19/20 149 lb (67.6 kg)  08/18/20 150 lb (68 kg)  04/19/20 148 lb (67.1 kg)    Physical Exam Vitals and nursing note reviewed.  Constitutional:      General: She is not in acute distress.    Appearance: She is well-developed. She is not diaphoretic.  Eyes:     Conjunctiva/sclera: Conjunctivae normal.  Cardiovascular:     Rate and Rhythm: Normal rate and regular rhythm.     Heart sounds: Normal heart sounds. No murmur heard.   Pulmonary:     Effort: Pulmonary effort is normal. No respiratory distress.     Breath sounds: Normal breath sounds. No wheezing.  Musculoskeletal:        General: No tenderness. Normal range of motion.  Skin:    General: Skin is warm and dry.     Findings: No rash.  Neurological:     Mental Status: She is alert and oriented to person, place, and time.     Coordination: Coordination normal.  Psychiatric:        Behavior: Behavior normal.       Assessment &  Plan:   Problem List Items Addressed This Visit      Cardiovascular and Mediastinum   HTN (hypertension)   Relevant Orders   CMP14+EGFR     Endocrine   Hypothyroid - Primary   Relevant Orders   TSH     Other   HLD (hyperlipidemia)   Relevant Orders   Lipid panel   Anxiety   Relevant Medications   hydrOXYzine (VISTARIL) 25 MG capsule   Other Relevant Orders   CBC with Differential/Platelet   TSH    Patient has been having continued low back pain sees orthopedic for this.  Because she is on pain medicine.  To Xanax  and switch to hydroxyzine.  She only uses it very infrequently.  We will do blood work today.  Follow up plan: Return in about 6 months (around 04/20/2021), or if symptoms worsen or fail to improve, for Hypothyroidism and hypertension and cholesterol anxiety.  Counseling provided for all of the vaccine components No orders of the defined types were placed in this encounter.   Caryl Pina, MD [facility] 10/19/2020, 8:48 AM

## 2020-10-20 LAB — CBC WITH DIFFERENTIAL/PLATELET
Basophils Absolute: 0.1 10*3/uL (ref 0.0–0.2)
Basos: 1 %
EOS (ABSOLUTE): 0.5 10*3/uL — ABNORMAL HIGH (ref 0.0–0.4)
Eos: 7 %
Hematocrit: 33.7 % — ABNORMAL LOW (ref 34.0–46.6)
Hemoglobin: 11.3 g/dL (ref 11.1–15.9)
Immature Grans (Abs): 0 10*3/uL (ref 0.0–0.1)
Immature Granulocytes: 0 %
Lymphocytes Absolute: 1.6 10*3/uL (ref 0.7–3.1)
Lymphs: 24 %
MCH: 30.9 pg (ref 26.6–33.0)
MCHC: 33.5 g/dL (ref 31.5–35.7)
MCV: 92 fL (ref 79–97)
Monocytes Absolute: 0.7 10*3/uL (ref 0.1–0.9)
Monocytes: 10 %
Neutrophils Absolute: 4 10*3/uL (ref 1.4–7.0)
Neutrophils: 58 %
Platelets: 240 10*3/uL (ref 150–450)
RBC: 3.66 x10E6/uL — ABNORMAL LOW (ref 3.77–5.28)
RDW: 12.4 % (ref 11.7–15.4)
WBC: 6.8 10*3/uL (ref 3.4–10.8)

## 2020-10-20 LAB — LIPID PANEL
Chol/HDL Ratio: 3 ratio (ref 0.0–4.4)
Cholesterol, Total: 190 mg/dL (ref 100–199)
HDL: 64 mg/dL (ref >39)
LDL Chol Calc (NIH): 103 mg/dL — ABNORMAL HIGH (ref 0–99)
Triglycerides: 131 mg/dL (ref 0–149)
VLDL Cholesterol Cal: 23 mg/dL (ref 5–40)

## 2020-10-20 LAB — TSH: TSH: 2.35 u[IU]/mL (ref 0.450–4.500)

## 2020-10-20 LAB — CMP14+EGFR
ALT: 17 IU/L (ref 0–32)
AST: 34 IU/L (ref 0–40)
Albumin/Globulin Ratio: 2 (ref 1.2–2.2)
Albumin: 4.2 g/dL (ref 3.6–4.6)
Alkaline Phosphatase: 68 IU/L (ref 44–121)
BUN/Creatinine Ratio: 18 (ref 12–28)
BUN: 18 mg/dL (ref 8–27)
Bilirubin Total: 0.3 mg/dL (ref 0.0–1.2)
CO2: 26 mmol/L (ref 20–29)
Calcium: 9.5 mg/dL (ref 8.7–10.3)
Chloride: 104 mmol/L (ref 96–106)
Creatinine, Ser: 1 mg/dL (ref 0.57–1.00)
Globulin, Total: 2.1 g/dL (ref 1.5–4.5)
Glucose: 96 mg/dL (ref 65–99)
Potassium: 5 mmol/L (ref 3.5–5.2)
Sodium: 143 mmol/L (ref 134–144)
Total Protein: 6.3 g/dL (ref 6.0–8.5)
eGFR: 54 mL/min/{1.73_m2} — ABNORMAL LOW (ref >59)

## 2020-10-27 DIAGNOSIS — M25561 Pain in right knee: Secondary | ICD-10-CM | POA: Diagnosis not present

## 2020-12-27 DIAGNOSIS — M5416 Radiculopathy, lumbar region: Secondary | ICD-10-CM | POA: Diagnosis not present

## 2021-01-25 ENCOUNTER — Ambulatory Visit (INDEPENDENT_AMBULATORY_CARE_PROVIDER_SITE_OTHER): Payer: Medicare Other

## 2021-01-25 VITALS — Ht 63.0 in | Wt 145.0 lb

## 2021-01-25 DIAGNOSIS — Z0000 Encounter for general adult medical examination without abnormal findings: Secondary | ICD-10-CM | POA: Diagnosis not present

## 2021-01-25 NOTE — Progress Notes (Signed)
Subjective:   Shelly Nelson is a 87 y.o. female who presents for Medicare Annual (Subsequent) preventive examination.  Virtual Visit via Telephone Note  I connected with  Shelly Nelson on 01/25/21 at  4:15 PM EDT by telephone and verified that I am speaking with the correct person using two identifiers.  Location: Patient: Home Provider: [facility] Persons participating in the virtual visit: patient/Nurse Health Advisor   I discussed the limitations, risks, security and privacy concerns of performing an evaluation and management service by telephone and the availability of in person appointments. The patient expressed understanding and agreed to proceed.  Interactive audio and video telecommunications were attempted between this nurse and patient, however failed, due to patient having technical difficulties OR patient did not have access to video capability.  We continued and completed visit with audio only.  Some vital signs may be absent or patient reported.   Shelly Nelson E Marja Adderley, LPN   Review of Systems     Cardiac Risk Factors include: advanced age (>83mn, >>71women);dyslipidemia;hypertension;sedentary lifestyle     Objective:    Today's Vitals   01/25/21 1539  Weight: 145 lb (65.8 kg)  Height: '5\' 3"'$  (1.6 m)  PainSc: 5    Body mass index is 25.69 kg/m.  Advanced Directives 01/25/2021 12/09/2019 10/07/2018 06/09/2012 06/04/2012  Does Patient Have a Medical Advance Directive? Yes Yes No Patient does not have advance directive Patient does not have advance directive;Patient would not like information  Type of Advance Directive Healthcare [place] of [place] Does patient want to make changes to medical advance directive? - No - Patient declined - - -  Copy of H[place]umin Chart? No - copy requested No - copy requested - - -  Would patient like information on creating a medical advance directive? - No - Patient declined  No - Patient declined - -  Pre-existing out of facility DNR order (yellow form or pink MOST form) - - - - No    Current Medications (verified) Outpatient Encounter Medications as of 01/25/2021  Medication Sig   atenolol (TENORMIN) 25 MG tablet 1/2 tablet daily   Calcium Carbonate-Vitamin D (CALCIUM-D) 600-400 MG-UNIT TABS Take 1 tablet by mouth daily.   conjugated estrogens (PREMARIN) vaginal cream INSERT 0.5 GRAMS VAGINALLY TWICE WEEKLY AT BEDTIME   docusate sodium (COLACE) 100 MG capsule Take 100 mg by mouth daily.   folic acid (FOLVITE) 8Q000111QMCG tablet Take 800 mcg by mouth daily.   furosemide (LASIX) 20 MG tablet Take 1 tablet (20 mg total) by mouth daily.   Garlic 1123XX123MG CAPS Take 1 capsule by mouth daily.   HYDROcodone-acetaminophen (NORCO) 10-325 MG tablet Take 1 tablet by mouth 3 (three) times daily as needed.   hydrOXYzine (VISTARIL) 25 MG capsule Take 1 capsule (25 mg total) by mouth at bedtime as needed.   loratadine (CLARITIN) 10 MG tablet Take 10 mg by mouth daily as needed.    losartan (COZAAR) 25 MG tablet Take 25 mg by mouth daily.   magnesium oxide (MAG-OX) 400 MG tablet Take 400 mg by mouth 2 (two) times daily.   Multiple Vitamin (MULTIVITAMIN WITH MINERALS) TABS Take 1 tablet by mouth daily.   Omega-3 Fatty Acids (FISH OIL) 1200 MG CAPS Take 1,200 mg by mouth daily.   omeprazole (PRILOSEC) 40 MG capsule Take 1 capsule (40 mg total) by mouth daily.   polyethylene glycol (MIRALAX / GLYCOLAX) 17 g packet Take  17 g by mouth daily.   pravastatin (PRAVACHOL) 40 MG tablet Take 1 tablet (40 mg total) by mouth daily.   psyllium (METAMUCIL) 58.6 % powder Take 1 packet by mouth 2 (two) times daily.   venlafaxine XR (EFFEXOR-XR) 75 MG 24 hr capsule Take one capsule daily with breakfast   No facility-administered encounter medications on file as of 01/25/2021.    Allergies (verified) Cephalexin   History: Past Medical History:  Diagnosis Date   Allergy    Anxiety     Arthritis    Basal cell carcinoma    GERD (gastroesophageal reflux disease) 2012   Hypercholesteremia 2000   Hypertension 2000   Spinal stenosis, lumbar region, without neurogenic claudication 2012   Squamous cell carcinoma    Thyroid disease 2013   thyroid nodule with needle aspiration   Past Surgical History:  Procedure Laterality Date   BACK SURGERY  11/08/2010   lumbar-bulging disc   CATARACT EXTRACTION     left eye-2011-Dr Hunt   CATARACT EXTRACTION W/PHACO  06/09/2012   Procedure: CATARACT EXTRACTION PHACO AND INTRAOCULAR LENS PLACEMENT ([place]);  Surgeon: Tonny Branch, MD;  Location: [facility];  Service: Ophthalmology;  Laterality: Left;  CDE=13.91   skin cancer removal N/A 09/10/13   THYROID LOBECTOMY  age 38   TONSILLECTOMY     age 73   TUBAL LIGATION     Family History  Problem Relation Age of Onset   Hypertension Mother    Anxiety disorder Mother    Breast cancer Mother 50       metasis to lung and bones   Hypertension Father    COPD Father    Hypertension Maternal Grandmother    Diabetes Maternal Grandmother        type 2   Hypertension Paternal Grandmother    Heart disease Paternal Grandmother    Stroke Maternal Grandfather    COPD Paternal Grandfather    Breast cancer Maternal Aunt    Cancer Maternal Uncle        X 5 with various cancers   Social History   Socioeconomic History   Marital status: Married    Spouse name: Gwyndolyn Saxon   Number of children: 1   Years of education: Not on file   Highest education level: High school graduate  Occupational History    Employer: RETIRED  Tobacco Use   Smoking status: Never   Smokeless tobacco: Never  Vaping Use   Vaping Use: Never used  Substance and Sexual Activity   Alcohol use: No   Drug use: No   Sexual activity: Not Currently    Birth control/protection: Post-menopausal, Surgical    Comment: BTL  Other Topics Concern   Not on file  Social History Narrative   One level living home with her husband.    Daughter and grandchildren all live very close   Social Determinants of Health   Financial Resource Strain: Low Risk    Difficulty of Paying Living Expenses: Not hard at all  Food Insecurity: No Food Insecurity   Worried About Charity fundraiser in the Last Year: Never true   Arboriculturist in the Last Year: Never true  Transportation Needs: No Transportation Needs   Lack of Transportation (Medical): No   Lack of Transportation (Non-Medical): No  Physical Activity: Inactive   Days of Exercise per Week: 0 days   Minutes of Exercise per Session: 0 min  Stress: No Stress Concern Present   Feeling of Stress : Only  a little  Social Connections: Engineer, building services of Communication with Friends and Family: More than three times a week   Frequency of Social Gatherings with Friends and Family: More than three times a week   Attends Religious Services: More than 4 times per year   Active Member of [employer] or Organizations: Yes   Attends Music therapist: More than 4 times per year   Marital Status: Married    Tobacco Counseling Counseling given: Not Answered   Clinical Intake:  Pre-visit preparation completed: Yes  Pain : 0-10 Pain Score: 5  Pain Type: Chronic pain Pain Location: Knee Pain Orientation: Right, Left Pain Descriptors / Indicators: Aching, Sore, Discomfort Pain Onset: More than a month ago Pain Frequency: Intermittent     BMI - recorded: 25.69 Nutritional Status: BMI 25 -29 Overweight Nutritional Risks: None Diabetes: No  How often do you need to have someone help you when you read instructions, pamphlets, or other written materials from your doctor or pharmacy?: 1 - Never  Diabetic? No  Interpreter Needed?: No  Information entered by :: Aura Bibby, LPN   Activities of Daily Living In your present state of health, do you have any difficulty performing the following activities: 01/25/2021  Hearing? N  Vision? N  Difficulty  concentrating or making decisions? N  Walking or climbing stairs? Y  Dressing or bathing? N  Doing errands, shopping? N  Preparing Food and eating ? N  Using the Toilet? N  In the past six months, have you accidently leaked urine? Y  Do you have problems with loss of bowel control? N  Managing your Medications? N  Managing your Finances? N  Housekeeping or managing your Housekeeping? N  Some recent data might be hidden    Patient Care Team: Dettinger, Fransisca Kaufmann, MD as PCP - General (Family Medicine) Bjorn Loser, MD as Attending Physician (Urology) Teena Irani, MD (Inactive) as Attending Physician (Gastroenterology) Romine, Lubertha South, MD as Attending Physician (Obstetrics and Gynecology)  Indicate any recent Medical Services you may have received from other than [facility] providers in the past year (date may be approximate).     Assessment:   This is a routine wellness examination for Shelly Nelson.  Hearing/Vision screen Hearing Screening - Comments:: Denies hearing difficulties  Vision Screening - Comments:: Wears glasses - up to date with annual eye exams at [facility] issues and exercise activities discussed: Current Exercise Habits: The patient does not participate in regular exercise at present, Exercise limited by: orthopedic condition(s)   Goals Addressed               This Visit's Progress     Patient Stated (pt-stated)   On track     Pt states she is trying to limit snacks between meals and to lose a "few pounds"      Patient Stated   Not on track     12/09/2019 AWV Goal: Exercise for General Health  Patient will verbalize understanding of the benefits of increased physical activity: Exercising regularly is important. It will improve your overall fitness, flexibility, and endurance. Regular exercise also will improve your overall health. It can help you control your weight, reduce stress, and improve your bone density. Over the next year, patient  will increase physical activity as tolerated with a goal of at least 150 minutes of moderate physical activity per week.  You can tell that you are exercising at a moderate intensity if your heart starts beating  faster and you start breathing faster but can still hold a conversation. Moderate-intensity exercise ideas include: Walking 1 mile (1.6 km) in about 15 minutes Biking Hiking Golfing Dancing Water aerobics Patient will verbalize understanding of everyday activities that increase physical activity by providing examples like the following: Yard work, such as: Sales promotion account executive Gardening Washing windows or floors Patient will be able to explain general safety guidelines for exercising:  Before you start a new exercise program, talk with your health care provider. Do not exercise so much that you hurt yourself, feel dizzy, or get very short of breath. Wear comfortable clothes and wear shoes with good support. Drink plenty of water while you exercise to prevent dehydration or heat stroke. Work out until your breathing and your heartbeat get faster.        Depression Screen PHQ 2/9 Scores 01/25/2021 10/19/2020 08/18/2020 04/19/2020 03/03/2020 12/09/2019 10/16/2019  PHQ - 2 Score 0 0 0 0 0 0 0    Fall Risk Fall Risk  01/25/2021 10/19/2020 08/18/2020 04/19/2020 03/03/2020  Falls in the past year? 1 1 0 1 0  Number falls in past yr: 0 0 - 0 0  Injury with Fall? 1 1 - 1 0  Comment - - - - -  Risk for fall due to : History of fall(s);Impaired vision;Medication side effect Impaired balance/gait - Impaired balance/gait Impaired balance/gait  Follow up Education provided;Falls prevention discussed Falls evaluation completed - Falls evaluation completed Falls evaluation completed    FALL RISK PREVENTION PERTAINING TO THE HOME:  Any stairs in or around the home? No  If so, are there any without handrails? No  Home  free of loose throw rugs in walkways, pet beds, electrical cords, etc? Yes  Adequate lighting in your home to reduce risk of falls? Yes   ASSISTIVE DEVICES UTILIZED TO PREVENT FALLS:  Life alert? No  Use of a cane, walker or w/c? Yes  Grab bars in the bathroom? Yes  Shower chair or bench in shower? Yes  Elevated toilet seat or a handicapped toilet? Yes   TIMED UP AND GO:  Was the test performed? No . Telephonic visit  Cognitive Function: Normal cognitive status assessed by direct observation by this Nurse Health Advisor. No abnormalities found.       6CIT Screen 12/09/2019 10/07/2018  What Year? 0 points 0 points  What month? 0 points 0 points  What time? 0 points 0 points  Count back from 20 0 points 0 points  Months in reverse 0 points 0 points  Repeat phrase 0 points 0 points  Total Score 0 0    Immunizations Immunization History  Administered Date(s) Administered   Fluad Quad(high Dose 65+) 02/27/2019, 02/19/2020   Influenza, High Dose Seasonal PF 02/08/2017, 02/20/2018   Influenza,inj,Quad PF,6+ Mos 02/10/2013, 02/18/2014, 02/16/2015, 02/02/2016   Moderna Sars-Covid-2 Vaccination 06/01/2019, 06/29/2019, 03/23/2020, 09/14/2020   Pneumococcal Conjugate-13 02/18/2014   Pneumococcal Polysaccharide-23 05/14/2005   Tdap 09/10/2012   Zoster Recombinat (Shingrix) 10/19/2020   Zoster, Live 02/12/2008    TDAP status: Up to date  Flu Vaccine status: Due, Education has been provided regarding the importance of this vaccine. Advised may receive this vaccine at local pharmacy or Health Dept. Aware to provide a copy of the vaccination record if obtained from local pharmacy or Health Dept. Verbalized acceptance and understanding.  Pneumococcal vaccine status: Up to date  Covid-19 vaccine status: Completed vaccines  Qualifies  for Shingles Vaccine? Yes   Zostavax completed Yes   Shingrix Completed?: No.    Education has been provided regarding the importance of this vaccine.  Patient has been advised to call insurance company to determine out of pocket expense if they have not yet received this vaccine. Advised may also receive vaccine at local pharmacy or Health Dept. Verbalized acceptance and understanding.  Screening Tests Health Maintenance  Topic Date Due   INFLUENZA VACCINE  12/12/2020   Zoster Vaccines- Shingrix (2 of 2) 12/14/2020   MAMMOGRAM  10/18/2021   TETANUS/TDAP  09/11/2022   DEXA SCAN  Completed   COVID-19 Vaccine  Completed   PNA vac Low Risk Adult  Completed   HPV VACCINES  Aged Out    Health Maintenance  Health Maintenance Due  Topic Date Due   INFLUENZA VACCINE  12/12/2020   Zoster Vaccines- Shingrix (2 of 2) 12/14/2020    Colorectal cancer screening: No longer required.   Mammogram status: Completed 10/18/2020. Repeat every year  Bone Density status: Completed 05/24/2015. Results reflect: Bone density results: OSTEOPENIA. Repeat every 2 years. She may get this at her next visit  Lung Cancer Screening: (Low Dose CT Chest recommended if Age 69-80 years, 30 pack-year currently smoking OR have quit w/in 15years.) does not qualify.   Additional Screening:  Hepatitis C Screening: does not qualify  Vision Screening: Recommended annual ophthalmology exams for early detection of glaucoma and other disorders of the eye. Is the patient up to date with their annual eye exam?  Yes  Who is the provider or what is the name of the office in which the patient attends annual eye exams? [facility] If pt is not established with a provider, would they like to be referred to a provider to establish care? No .   Dental Screening: Recommended annual dental exams for proper oral hygiene  Community Resource Referral / Chronic Care Management: CRR required this visit?  No   CCM required this visit?  No      Plan:     I have personally reviewed and noted the following in the patient's chart:   Medical and social history Use of alcohol,  tobacco or illicit drugs  Current medications and supplements including opioid prescriptions.  Functional ability and status Nutritional status Physical activity Advanced directives List of other physicians Hospitalizations, surgeries, and ER visits in previous 12 months Vitals Screenings to include cognitive, depression, and falls Referrals and appointments  In addition, I have reviewed and discussed with patient certain preventive protocols, quality metrics, and best practice recommendations. A written personalized care plan for preventive services as well as general preventive health recommendations were provided to patient.     Sandrea Hammond, LPN   [id number]   Nurse Notes: None

## 2021-01-25 NOTE — Patient Instructions (Signed)
Ms. Schuring , Thank you for taking time to come for your Medicare Wellness Visit. I appreciate your ongoing commitment to your health goals. Please review the following plan we discussed and let me know if I can assist you in the future.   Screening recommendations/referrals: Colonoscopy: Done 01/29/2018 - No repeat required Mammogram: Done 10/18/2020 - Repeat annually  Bone Density: Done 05/24/2015 - Repeat every 2 years *get at next visit Recommended yearly ophthalmology/optometry visit for glaucoma screening and checkup Recommended yearly dental visit for hygiene and checkup  Vaccinations: Influenza vaccine: Done 02/19/2020 - Repeat annually Pneumococcal vaccine: Done 07/31/2012 & 02/18/2014 Tdap vaccine: Done 09/10/2012 - Repeat in 10 years Shingles vaccine: Done 10/19/2020 - get second dose at next visit   Covid-19: Done 06/01/2019, 06/29/2019, 03/23/2020, & 09/14/2020  Advanced directives: Please bring a copy of your health care power of attorney and living will to the office to be added to your chart at your convenience.   Conditions/risks identified: Continue using your cane or a walker, careful to prevent falls. Try balance and strength exercises. Deep breathing for anxiety.  Next appointment: Follow up in one year for your annual wellness visit    Preventive Care 65 Years and Older, Female Preventive care refers to lifestyle choices and visits with your health care provider that can promote health and wellness. What does preventive care include? A yearly physical exam. This is also called an annual well check. Dental exams once or twice a year. Routine eye exams. Ask your health care provider how often you should have your eyes checked. Personal lifestyle choices, including: Daily care of your teeth and gums. Regular physical activity. Eating a healthy diet. Avoiding tobacco and drug use. Limiting alcohol use. Practicing safe sex. Taking low-dose aspirin every day. Taking vitamin and  mineral supplements as recommended by your health care provider. What happens during an annual well check? The services and screenings done by your health care provider during your annual well check will depend on your age, overall health, lifestyle risk factors, and family history of disease. Counseling  Your health care provider may ask you questions about your: Alcohol use. Tobacco use. Drug use. Emotional well-being. Home and relationship well-being. Sexual activity. Eating habits. History of falls. Memory and ability to understand (cognition). Work and work Statistician. Reproductive health. Screening  You may have the following tests or measurements: Height, weight, and BMI. Blood pressure. Lipid and cholesterol levels. These may be checked every 5 years, or more frequently if you are over 84 years old. Skin check. Lung cancer screening. You may have this screening every year starting at age 88 if you have a 30-pack-year history of smoking and currently smoke or have quit within the past 15 years. Fecal occult blood test (FOBT) of the stool. You may have this test every year starting at age 14. Flexible sigmoidoscopy or colonoscopy. You may have a sigmoidoscopy every 5 years or a colonoscopy every 10 years starting at age 18. Hepatitis C blood test. Hepatitis B blood test. Sexually transmitted disease (STD) testing. Diabetes screening. This is done by checking your blood sugar (glucose) after you have not eaten for a while (fasting). You may have this done every 1-3 years. Bone density scan. This is done to screen for osteoporosis. You may have this done starting at age 45. Mammogram. This may be done every 1-2 years. Talk to your health care provider about how often you should have regular mammograms. Talk with your health care provider about your  test results, treatment options, and if necessary, the need for more tests. Vaccines  Your health care provider may recommend  certain vaccines, such as: Influenza vaccine. This is recommended every year. Tetanus, diphtheria, and acellular pertussis (Tdap, Td) vaccine. You may need a Td booster every 10 years. Zoster vaccine. You may need this after age 73. Pneumococcal 13-valent conjugate (PCV13) vaccine. One dose is recommended after age 19. Pneumococcal polysaccharide (PPSV23) vaccine. One dose is recommended after age 92. Talk to your health care provider about which screenings and vaccines you need and how often you need them. This information is not intended to replace advice given to you by your health care provider. Make sure you discuss any questions you have with your health care provider. Document Released: 05/27/2015 Document Revised: 01/18/2016 Document Reviewed: 03/01/2015 Elsevier Interactive Patient Education  2017 [place] Prevention in the Home Falls can cause injuries. They can happen to people of all ages. There are many things you can do to make your home safe and to help prevent falls. What can I do on the outside of my home? Regularly fix the edges of walkways and driveways and fix any cracks. Remove anything that might make you trip as you walk through a door, such as a raised step or threshold. Trim any bushes or trees on the path to your home. Use bright outdoor lighting. Clear any walking paths of anything that might make someone trip, such as rocks or tools. Regularly check to see if handrails are loose or broken. Make sure that both sides of any steps have handrails. Any raised decks and porches should have guardrails on the edges. Have any leaves, snow, or ice cleared regularly. Use sand or salt on walking paths during winter. Clean up any spills in your garage right away. This includes oil or grease spills. What can I do in the bathroom? Use night lights. Install grab bars by the toilet and in the tub and shower. Do not use towel bars as grab bars. Use non-skid mats or  decals in the tub or shower. If you need to sit down in the shower, use a plastic, non-slip stool. Keep the floor dry. Clean up any water that spills on the floor as soon as it happens. Remove soap buildup in the tub or shower regularly. Attach bath mats securely with double-sided non-slip rug tape. Do not have throw rugs and other things on the floor that can make you trip. What can I do in the bedroom? Use night lights. Make sure that you have a light by your bed that is easy to reach. Do not use any sheets or blankets that are too big for your bed. They should not hang down onto the floor. Have a firm chair that has side arms. You can use this for support while you get dressed. Do not have throw rugs and other things on the floor that can make you trip. What can I do in the kitchen? Clean up any spills right away. Avoid walking on wet floors. Keep items that you use a lot in easy-to-reach places. If you need to reach something above you, use a strong step stool that has a grab bar. Keep electrical cords out of the way. Do not use floor polish or wax that makes floors slippery. If you must use wax, use non-skid floor wax. Do not have throw rugs and other things on the floor that can make you trip. What can I do with my  stairs? Do not leave any items on the stairs. Make sure that there are handrails on both sides of the stairs and use them. Fix handrails that are broken or loose. Make sure that handrails are as long as the stairways. Check any carpeting to make sure that it is firmly attached to the stairs. Fix any carpet that is loose or worn. Avoid having throw rugs at the top or bottom of the stairs. If you do have throw rugs, attach them to the floor with carpet tape. Make sure that you have a light switch at the top of the stairs and the bottom of the stairs. If you do not have them, ask someone to add them for you. What else can I do to help prevent falls? Wear shoes that: Do not  have high heels. Have rubber bottoms. Are comfortable and fit you well. Are closed at the toe. Do not wear sandals. If you use a stepladder: Make sure that it is fully opened. Do not climb a closed stepladder. Make sure that both sides of the stepladder are locked into place. Ask someone to hold it for you, if possible. Clearly mark and make sure that you can see: Any grab bars or handrails. First and last steps. Where the edge of each step is. Use tools that help you move around (mobility aids) if they are needed. These include: Canes. Walkers. Scooters. Crutches. Turn on the lights when you go into a dark area. Replace any light bulbs as soon as they burn out. Set up your furniture so you have a clear path. Avoid moving your furniture around. If any of your floors are uneven, fix them. If there are any pets around you, be aware of where they are. Review your medicines with your doctor. Some medicines can make you feel dizzy. This can increase your chance of falling. Ask your doctor what other things that you can do to help prevent falls. This information is not intended to replace advice given to you by your health care provider. Make sure you discuss any questions you have with your health care provider. Document Released: 02/24/2009 Document Revised: 10/06/2015 Document Reviewed: 06/04/2014 Elsevier Interactive Patient Education  2017 [employer].

## 2021-02-21 ENCOUNTER — Other Ambulatory Visit: Payer: Self-pay

## 2021-02-21 ENCOUNTER — Ambulatory Visit (INDEPENDENT_AMBULATORY_CARE_PROVIDER_SITE_OTHER): Payer: Medicare Other

## 2021-02-21 DIAGNOSIS — Z23 Encounter for immunization: Secondary | ICD-10-CM | POA: Diagnosis not present

## 2021-03-22 DIAGNOSIS — Z23 Encounter for immunization: Secondary | ICD-10-CM | POA: Diagnosis not present

## 2021-04-07 ENCOUNTER — Other Ambulatory Visit: Payer: Self-pay | Admitting: Family Medicine

## 2021-04-07 NOTE — Telephone Encounter (Signed)
Last office visit 10/19/20 Medication on med history list, not prescribed by [facility] before

## 2021-04-19 ENCOUNTER — Other Ambulatory Visit: Payer: Self-pay

## 2021-04-19 ENCOUNTER — Encounter: Payer: Self-pay | Admitting: Family Medicine

## 2021-04-19 ENCOUNTER — Ambulatory Visit (INDEPENDENT_AMBULATORY_CARE_PROVIDER_SITE_OTHER): Payer: Medicare Other | Admitting: Family Medicine

## 2021-04-19 VITALS — BP 180/67 | HR 82 | Ht 63.0 in | Wt 146.0 lb

## 2021-04-19 DIAGNOSIS — F419 Anxiety disorder, unspecified: Secondary | ICD-10-CM | POA: Diagnosis not present

## 2021-04-19 DIAGNOSIS — E039 Hypothyroidism, unspecified: Secondary | ICD-10-CM | POA: Diagnosis not present

## 2021-04-19 DIAGNOSIS — I10 Essential (primary) hypertension: Secondary | ICD-10-CM

## 2021-04-19 DIAGNOSIS — Z780 Asymptomatic menopausal state: Secondary | ICD-10-CM

## 2021-04-19 DIAGNOSIS — E7800 Pure hypercholesterolemia, unspecified: Secondary | ICD-10-CM

## 2021-04-19 MED ORDER — ATENOLOL 25 MG PO TABS: ORAL_TABLET | ORAL | 3 refills | Status: DC

## 2021-04-19 MED ORDER — OMEPRAZOLE 40 MG PO CPDR: 40.0000 mg | DELAYED_RELEASE_CAPSULE | Freq: Every day | ORAL | 3 refills | Status: DC

## 2021-04-19 MED ORDER — LOSARTAN POTASSIUM 25 MG PO TABS: 25.0000 mg | ORAL_TABLET | Freq: Every day | ORAL | 3 refills | Status: DC

## 2021-04-19 MED ORDER — VENLAFAXINE HCL ER 75 MG PO CP24: ORAL_CAPSULE | ORAL | 3 refills | Status: DC

## 2021-04-19 MED ORDER — PRAVASTATIN SODIUM 40 MG PO TABS: 40.0000 mg | ORAL_TABLET | Freq: Every day | ORAL | 3 refills | Status: DC

## 2021-04-19 NOTE — Progress Notes (Signed)
BP (!) 180/67   Pulse 82   Ht _0  (1.6 m)   Wt 146 lb (66.2 kg)   LMP 05/14/2001 (Approximate)   SpO2 95%   BMI 25.86 kg/m    Subjective:   Patient ID: Shelly Nelson, female    DOB: 10-21-1929, 87 y.o.   MRN: [id number]  HPI: Shelly Nelson is a 87 y.o. female presenting on 04/19/2021 for Medical Management of Chronic Issues, Hypothyroidism, Gastroesophageal Reflux, and Hypertension   HPI Subclinical hypothyroidism recheck Patient is coming in for thyroid recheck today as well. They deny any issues with hair changes or heat or cold problems or diarrhea or constipation. They deny any chest pain or palpitations. They are currently on no medication currently we have been monitoring  Hypertension Patient is currently on losartan and atenolol, and their blood pressure today is 180/67 but multiple home blood pressures show that she has better numbers.  She runs up a little bit in the morning and then in the 130s throughout most of the day and she checks 3 times a day. Patient denies any lightheadedness or dizziness. Patient denies headaches, blurred vision, chest pains, shortness of breath, or weakness. Denies any side effects from medication and is content with current medication.   Hyperlipidemia Patient is coming in for recheck of his hyperlipidemia. The patient is currently taking pravastatin. They deny any issues with myalgias or history of liver damage from it. They deny any focal numbness or weakness or chest pain.   Anxiety and Depression Patient is coming in today for anxiety depression recheck.  She feels like she is doing okay in this department.  Her biggest struggle has been that she is having more pain in her neck and sees Dr. Herma Mering for this and has been working with that.  Relevant past medical, surgical, family and social history reviewed and updated as indicated. Interim medical history since our last visit reviewed. Allergies and medications reviewed and  updated.  Review of Systems  Constitutional:  Negative for chills and fever.  Eyes:  Negative for visual disturbance.  Respiratory:  Negative for chest tightness and shortness of breath.   Cardiovascular:  Negative for chest pain and leg swelling.  Musculoskeletal:  Positive for arthralgias and neck pain. Negative for back pain and gait problem.  Skin:  Negative for rash.  Neurological:  Negative for dizziness, light-headedness and headaches.  Psychiatric/Behavioral:  Positive for dysphoric mood. Negative for agitation, behavioral problems, self-injury, sleep disturbance and suicidal ideas. The patient is nervous/anxious.   All other systems reviewed and are negative.  Per HPI unless specifically indicated above   Allergies as of 04/19/2021       Reactions   Cephalexin Hives        Medication List        Accurate as of April 19, 2021  9:19 AM. If you have any questions, ask your nurse or doctor.          atenolol 25 MG tablet Commonly known as: TENORMIN 1/2 tablet daily   Calcium-D 600-400 MG-UNIT Tabs Take 1 tablet by mouth daily.   docusate sodium 100 MG capsule Commonly known as: COLACE Take 100 mg by mouth daily.   Fish Oil 1200 MG Caps Take 1,200 mg by mouth daily.   folic acid 100 MCG tablet Commonly known as: FOLVITE Take 800 mcg by mouth daily.   furosemide 20 MG tablet Commonly known as: LASIX Take 1 tablet (20 mg total) by mouth daily.  Garlic 0263 MG Caps Take 1 capsule by mouth daily.   HYDROcodone-acetaminophen 10-325 MG tablet Commonly known as: NORCO Take 1 tablet by mouth 3 (three) times daily as needed.   hydrOXYzine 25 MG capsule Commonly known as: VISTARIL Take 1 capsule (25 mg total) by mouth at bedtime as needed.   loratadine 10 MG tablet Commonly known as: CLARITIN Take 10 mg by mouth daily as needed.   losartan 25 MG tablet Commonly known as: COZAAR Take 1 tablet (25 mg total) by mouth daily.   magnesium oxide 400 MG  tablet Commonly known as: MAG-OX Take 400 mg by mouth 2 (two) times daily.   multivitamin with minerals Tabs tablet Take 1 tablet by mouth daily.   omeprazole 40 MG capsule Commonly known as: PRILOSEC Take 1 capsule (40 mg total) by mouth daily.   polyethylene glycol 17 g packet Commonly known as: MIRALAX / GLYCOLAX Take 17 g by mouth daily.   pravastatin 40 MG tablet Commonly known as: PRAVACHOL Take 1 tablet (40 mg total) by mouth daily.   Premarin vaginal cream Generic drug: conjugated estrogens INSERT 0.5 GRAMS VAGINALLY TWICE WEEKLY AT BEDTIME   psyllium 58.6 % powder Commonly known as: METAMUCIL Take 1 packet by mouth 2 (two) times daily.   venlafaxine XR 75 MG 24 hr capsule Commonly known as: EFFEXOR-XR Take one capsule daily with breakfast         Objective:   BP (!) 180/67   Pulse 82   Ht _0  (1.6 m)   Wt 146 lb (66.2 kg)   LMP 05/14/2001 (Approximate)   SpO2 95%   BMI 25.86 kg/m   Wt Readings from Last 3 Encounters:  04/19/21 146 lb (66.2 kg)  01/25/21 145 lb (65.8 kg)  10/19/20 149 lb (67.6 kg)    Physical Exam Vitals and nursing note reviewed.  Constitutional:      General: She is not in acute distress.    Appearance: She is well-developed. She is not diaphoretic.  Eyes:     Conjunctiva/sclera: Conjunctivae normal.     Pupils: Pupils are equal, round, and reactive to light.  Cardiovascular:     Rate and Rhythm: Normal rate and regular rhythm.     Heart sounds: Normal heart sounds. No murmur heard. Pulmonary:     Effort: Pulmonary effort is normal. No respiratory distress.     Breath sounds: Normal breath sounds. No wheezing.  Musculoskeletal:        General: No tenderness. Normal range of motion.  Skin:    General: Skin is warm and dry.     Findings: Bruising (Bruising on left side of the left breast and under her left eye) present. No rash.  Neurological:     Mental Status: She is alert and oriented to person, place, and time.      Coordination: Coordination normal.  Psychiatric:        Behavior: Behavior normal.      Assessment & Plan:   Problem List Items Addressed This Visit       Cardiovascular and Mediastinum   HTN (hypertension)   Relevant Medications   atenolol (TENORMIN) 25 MG tablet   losartan (COZAAR) 25 MG tablet   pravastatin (PRAVACHOL) 40 MG tablet   Other Relevant Orders   CMP14+EGFR     Endocrine   Hypothyroid   Relevant Medications   atenolol (TENORMIN) 25 MG tablet   Other Relevant Orders   TSH     Other   Postmenopausal -  Primary   Relevant Orders   DG [facility] DEXA   HLD (hyperlipidemia)   Relevant Medications   atenolol (TENORMIN) 25 MG tablet   losartan (COZAAR) 25 MG tablet   pravastatin (PRAVACHOL) 40 MG tablet   Other Relevant Orders   Lipid panel   Anxiety   Relevant Medications   venlafaxine XR (EFFEXOR-XR) 75 MG 24 hr capsule   Other Relevant Orders   CBC with Differential/Platelet   Other Visit Diagnoses     Essential hypertension       Relevant Medications   atenolol (TENORMIN) 25 MG tablet   losartan (COZAAR) 25 MG tablet   pravastatin (PRAVACHOL) 40 MG tablet   Other Relevant Orders   CMP14+EGFR       Patient had a recent fall, she says she tripped over a curb and did not know that with step there, I discussed sending her to physical therapy to strengthen so we could prevent future falls and she is going to think about it.  Nothing appears fractured from this time.  Blood pressure, tolerable at home, allowing permissive hypertension because of age.  She also has some form of whitecoat syndrome.  No change in medication, will check blood work Follow up plan: Return in about 6 months (around 10/18/2021), or if symptoms worsen or fail to improve, for Thyroid and hypertension and cholesterol.  Counseling provided for all of the vaccine components Orders Placed This Encounter  Procedures   DG [facility] DEXA   CBC with Differential/Platelet   CMP14+EGFR    Lipid panel   TSH    Caryl Pina, MD [facility] 04/19/2021, 9:19 AM

## 2021-04-20 ENCOUNTER — Telehealth: Payer: Self-pay | Admitting: Family Medicine

## 2021-04-20 LAB — CBC WITH DIFFERENTIAL/PLATELET
Basophils Absolute: 0.1 10*3/uL (ref 0.0–0.2)
Basos: 1 %
EOS (ABSOLUTE): 0.4 10*3/uL (ref 0.0–0.4)
Eos: 5 %
Hematocrit: 36.3 % (ref 34.0–46.6)
Hemoglobin: 12 g/dL (ref 11.1–15.9)
Immature Grans (Abs): 0 10*3/uL (ref 0.0–0.1)
Immature Granulocytes: 0 %
Lymphocytes Absolute: 1.7 10*3/uL (ref 0.7–3.1)
Lymphs: 25 %
MCH: 30.8 pg (ref 26.6–33.0)
MCHC: 33.1 g/dL (ref 31.5–35.7)
MCV: 93 fL (ref 79–97)
Monocytes Absolute: 0.6 10*3/uL (ref 0.1–0.9)
Monocytes: 8 %
Neutrophils Absolute: 4 10*3/uL (ref 1.4–7.0)
Neutrophils: 61 %
Platelets: 247 10*3/uL (ref 150–450)
RBC: 3.89 x10E6/uL (ref 3.77–5.28)
RDW: 13.2 % (ref 11.7–15.4)
WBC: 6.7 10*3/uL (ref 3.4–10.8)

## 2021-04-20 LAB — CMP14+EGFR
ALT: 20 IU/L (ref 0–32)
AST: 35 IU/L (ref 0–40)
Albumin/Globulin Ratio: 2.3 — ABNORMAL HIGH (ref 1.2–2.2)
Albumin: 4.5 g/dL (ref 3.6–4.6)
Alkaline Phosphatase: 69 IU/L (ref 44–121)
BUN/Creatinine Ratio: 13 (ref 12–28)
BUN: 15 mg/dL (ref 8–27)
Bilirubin Total: 0.3 mg/dL (ref 0.0–1.2)
CO2: 26 mmol/L (ref 20–29)
Calcium: 9.4 mg/dL (ref 8.7–10.3)
Chloride: 100 mmol/L (ref 96–106)
Creatinine, Ser: 1.15 mg/dL — ABNORMAL HIGH (ref 0.57–1.00)
Globulin, Total: 2 g/dL (ref 1.5–4.5)
Glucose: 96 mg/dL (ref 70–99)
Potassium: 4.9 mmol/L (ref 3.5–5.2)
Sodium: 140 mmol/L (ref 134–144)
Total Protein: 6.5 g/dL (ref 6.0–8.5)
eGFR: 46 mL/min/{1.73_m2} — ABNORMAL LOW (ref >59)

## 2021-04-20 LAB — LIPID PANEL
Chol/HDL Ratio: 2.6 ratio (ref 0.0–4.4)
Cholesterol, Total: 204 mg/dL — ABNORMAL HIGH (ref 100–199)
HDL: 78 mg/dL (ref >39)
LDL Chol Calc (NIH): 105 mg/dL — ABNORMAL HIGH (ref 0–99)
Triglycerides: 124 mg/dL (ref 0–149)
VLDL Cholesterol Cal: 21 mg/dL (ref 5–40)

## 2021-04-20 LAB — TSH: TSH: 2.97 u[IU]/mL (ref 0.450–4.500)

## 2021-04-21 NOTE — Telephone Encounter (Signed)
Lmtcb.

## 2021-05-02 NOTE — Telephone Encounter (Signed)
Called patient - appt made for 05/19/2021

## 2021-05-19 ENCOUNTER — Ambulatory Visit (INDEPENDENT_AMBULATORY_CARE_PROVIDER_SITE_OTHER): Payer: Medicare Other

## 2021-05-19 DIAGNOSIS — Z780 Asymptomatic menopausal state: Secondary | ICD-10-CM | POA: Diagnosis not present

## 2021-05-19 DIAGNOSIS — M85851 Other specified disorders of bone density and structure, right thigh: Secondary | ICD-10-CM | POA: Diagnosis not present

## 2021-05-21 ENCOUNTER — Other Ambulatory Visit: Payer: Self-pay | Admitting: Family Medicine

## 2021-05-24 ENCOUNTER — Encounter: Payer: Self-pay | Admitting: Family Medicine

## 2021-06-05 ENCOUNTER — Other Ambulatory Visit: Payer: Self-pay | Admitting: Family Medicine

## 2021-06-05 DIAGNOSIS — I10 Essential (primary) hypertension: Secondary | ICD-10-CM

## 2021-06-06 DIAGNOSIS — M5416 Radiculopathy, lumbar region: Secondary | ICD-10-CM | POA: Diagnosis not present

## 2021-06-19 ENCOUNTER — Other Ambulatory Visit: Payer: Self-pay | Admitting: Family Medicine

## 2021-06-19 DIAGNOSIS — F419 Anxiety disorder, unspecified: Secondary | ICD-10-CM

## 2021-08-10 ENCOUNTER — Other Ambulatory Visit: Payer: Self-pay | Admitting: Family Medicine

## 2021-08-10 DIAGNOSIS — N952 Postmenopausal atrophic vaginitis: Secondary | ICD-10-CM

## 2021-08-10 DIAGNOSIS — F419 Anxiety disorder, unspecified: Secondary | ICD-10-CM

## 2021-09-20 DIAGNOSIS — Z79899 Other long term (current) drug therapy: Secondary | ICD-10-CM | POA: Diagnosis not present

## 2021-09-20 DIAGNOSIS — M5416 Radiculopathy, lumbar region: Secondary | ICD-10-CM | POA: Diagnosis not present

## 2021-10-10 ENCOUNTER — Other Ambulatory Visit: Payer: Self-pay | Admitting: Family Medicine

## 2021-10-10 DIAGNOSIS — Z1231 Encounter for screening mammogram for malignant neoplasm of breast: Secondary | ICD-10-CM

## 2021-10-18 ENCOUNTER — Encounter: Payer: Self-pay | Admitting: Family Medicine

## 2021-10-18 ENCOUNTER — Ambulatory Visit (INDEPENDENT_AMBULATORY_CARE_PROVIDER_SITE_OTHER): Payer: Medicare Other | Admitting: Family Medicine

## 2021-10-18 VITALS — BP 143/73 | HR 76 | Temp 97.2°F | Ht 63.0 in | Wt 147.0 lb

## 2021-10-18 DIAGNOSIS — Z23 Encounter for immunization: Secondary | ICD-10-CM

## 2021-10-18 DIAGNOSIS — I10 Essential (primary) hypertension: Secondary | ICD-10-CM | POA: Diagnosis not present

## 2021-10-18 DIAGNOSIS — N952 Postmenopausal atrophic vaginitis: Secondary | ICD-10-CM

## 2021-10-18 DIAGNOSIS — F419 Anxiety disorder, unspecified: Secondary | ICD-10-CM | POA: Diagnosis not present

## 2021-10-18 DIAGNOSIS — E7800 Pure hypercholesterolemia, unspecified: Secondary | ICD-10-CM | POA: Diagnosis not present

## 2021-10-18 MED ORDER — PREMARIN 0.625 MG/GM VA CREA: TOPICAL_CREAM | VAGINAL | 5 refills | Status: DC

## 2021-10-18 MED ORDER — VENLAFAXINE HCL ER 75 MG PO CP24: ORAL_CAPSULE | ORAL | 3 refills | Status: DC

## 2021-10-18 MED ORDER — HYDROXYZINE PAMOATE 25 MG PO CAPS: 25.0000 mg | ORAL_CAPSULE | Freq: Every evening | ORAL | 3 refills | Status: DC | PRN

## 2021-10-18 NOTE — Progress Notes (Signed)
BP (!) 143/73   Pulse 76   Temp (!) 97.2 F (36.2 C)   Ht _0  (1.6 m)   Wt 147 lb (66.7 kg)   LMP 05/14/2001 (Approximate)   SpO2 97%   BMI 26.04 kg/m    Subjective:   Patient ID: Shelly Nelson, female    DOB: [birth year], 88 y.o.   MRN: [id number]  HPI: Shelly Nelson is a 88 y.o. female presenting on 10/18/2021 for Medical Management of Chronic Issues, Hypothyroidism, and Hypertension   HPI Hypertension Patient is currently on atenolol and losartan, and their blood pressure today is 143/73. Patient denies any lightheadedness or dizziness. Patient denies headaches, blurred vision, chest pains, shortness of breath, or weakness. Denies any side effects from medication and is content with current medication.   Hyperlipidemia Patient is coming in for recheck of his hyperlipidemia. The patient is currently taking fish oils and pravastatin. They deny any issues with myalgias or history of liver damage from it. They deny any focal numbness or weakness or chest pain.   Anxiety recheck Venlafaxine is what she takes currently.  She says she is doing okay right now.  She is a little saddened with the loss of her husband a few months ago but she says she is actually doing well with it because he is in a better place but they were together essentially 70+ years so it still tough to be without him.  She denies any suicidal ideations or thoughts of hurting herself.    10/18/2021    9:24 AM 04/19/2021    8:52 AM 04/19/2021    8:51 AM 01/25/2021    3:41 PM 10/19/2020    8:33 AM  Depression screen PHQ 2/9  Decreased Interest 0  0 0 0  Down, Depressed, Hopeless 0  0 0 0  PHQ - 2 Score 0  0 0 0  Altered sleeping 1 1     Tired, decreased energy 1 1     Change in appetite 2 2     Feeling bad or failure about yourself  0 0     Trouble concentrating 0 0     Moving slowly or fidgety/restless 3 0     Suicidal thoughts 0 0     PHQ-9 Score 7      Difficult doing work/chores Not  difficult at all         Postmenopausal atrophic vaginitis Patient uses estrogen cream and it does work well for her for the atrophic vaginitis.  Relevant past medical, surgical, family and social history reviewed and updated as indicated. Interim medical history since our last visit reviewed. Allergies and medications reviewed and updated.  Review of Systems  Constitutional:  Negative for chills and fever.  Eyes:  Negative for visual disturbance.  Respiratory:  Negative for chest tightness and shortness of breath.   Cardiovascular:  Negative for chest pain and leg swelling.  Musculoskeletal:  Negative for back pain and gait problem.  Skin:  Negative for rash.  Neurological:  Negative for dizziness, light-headedness and headaches.  Psychiatric/Behavioral:  Negative for agitation and behavioral problems.   All other systems reviewed and are negative.  Per HPI unless specifically indicated above   Allergies as of 10/18/2021       Reactions   Cephalexin Hives        Medication List        Accurate as of October 18, 2021  9:45 AM. If you have any questions, ask your  nurse or doctor.          STOP taking these medications    docusate sodium 100 MG capsule Commonly known as: COLACE Stopped by: Fransisca Kaufmann Makaylia Hewett, MD   furosemide 20 MG tablet Commonly known as: LASIX Stopped by: Fransisca Kaufmann Riku Buttery, MD       TAKE these medications    atenolol 25 MG tablet Commonly known as: TENORMIN Take 1/2 (one-half) tablet by mouth once daily   Calcium-D 600-400 MG-UNIT Tabs Take 1 tablet by mouth daily.   Fish Oil 1200 MG Caps Take 1,200 mg by mouth daily.   folic acid 829 MCG tablet Commonly known as: FOLVITE Take 800 mcg by mouth daily.   Garlic 9371 MG Caps Take 1 capsule by mouth daily.   HYDROcodone-acetaminophen 10-325 MG tablet Commonly known as: NORCO Take 1 tablet by mouth 3 (three) times daily as needed.   hydrOXYzine 25 MG capsule Commonly known as:  VISTARIL Take 1 capsule (25 mg total) by mouth at bedtime as needed.   loratadine 10 MG tablet Commonly known as: CLARITIN Take 10 mg by mouth daily as needed.   losartan 25 MG tablet Commonly known as: COZAAR Take 1 tablet (25 mg total) by mouth daily.   magnesium oxide 400 MG tablet Commonly known as: MAG-OX Take 400 mg by mouth 2 (two) times daily.   multivitamin with minerals Tabs tablet Take 1 tablet by mouth daily.   omeprazole 40 MG capsule Commonly known as: PRILOSEC Take 1 capsule (40 mg total) by mouth daily.   polyethylene glycol 17 g packet Commonly known as: MIRALAX / GLYCOLAX Take 17 g by mouth daily.   pravastatin 40 MG tablet Commonly known as: PRAVACHOL Take 1 tablet (40 mg total) by mouth daily.   Premarin vaginal cream Generic drug: conjugated estrogens INSERT 1/2 (ONE-HALF) GRAM VAGINALLY TWICE A WEEK AT BEDTIME   psyllium 58.6 % powder Commonly known as: METAMUCIL Take 1 packet by mouth 2 (two) times daily.   venlafaxine XR 75 MG 24 hr capsule Commonly known as: EFFEXOR-XR TAKE 1 CAPSULE BY MOUTH ONCE DAILY WITH BREAKFAST         Objective:   BP (!) 143/73   Pulse 76   Temp (!) 97.2 F (36.2 C)   Ht _0  (1.6 m)   Wt 147 lb (66.7 kg)   LMP 05/14/2001 (Approximate)   SpO2 97%   BMI 26.04 kg/m   Wt Readings from Last 3 Encounters:  10/18/21 147 lb (66.7 kg)  04/19/21 146 lb (66.2 kg)  01/25/21 145 lb (65.8 kg)    Physical Exam Vitals and nursing note reviewed.  Constitutional:      General: She is not in acute distress.    Appearance: She is well-developed. She is not diaphoretic.  Eyes:     Conjunctiva/sclera: Conjunctivae normal.  Cardiovascular:     Rate and Rhythm: Normal rate and regular rhythm.     Heart sounds: Normal heart sounds. No murmur heard. Pulmonary:     Effort: Pulmonary effort is normal. No respiratory distress.     Breath sounds: Normal breath sounds. No wheezing.  Musculoskeletal:        General: No  swelling or tenderness. Normal range of motion.  Skin:    General: Skin is warm and dry.     Findings: No rash.  Neurological:     Mental Status: She is alert and oriented to person, place, and time.     Coordination: Coordination normal.  Psychiatric:        Behavior: Behavior normal.      Assessment & Plan:   Problem List Items Addressed This Visit       Cardiovascular and Mediastinum   HTN (hypertension)   Relevant Orders   CBC with Differential/Platelet   CMP14+EGFR   White coat syndrome with hypertension     Genitourinary   Postmenopausal atrophic vaginitis     Other   HLD (hyperlipidemia)   Relevant Orders   CBC with Differential/Platelet   CMP14+EGFR   Lipid panel   Anxiety   Relevant Medications   hydrOXYzine (VISTARIL) 25 MG capsule   venlafaxine XR (EFFEXOR-XR) 75 MG 24 hr capsule   Other Relevant Orders   CBC with Differential/Platelet   CMP14+EGFR   TSH   Other Visit Diagnoses     Post-menopausal atrophic vaginitis    -  Primary   Relevant Medications   conjugated estrogens (PREMARIN) vaginal cream   Need for shingles vaccine       Relevant Orders   Varicella-zoster vaccine IM (Shingrix) (Completed)       We will check blood work, blood pressures look decent both here and at home.  No changes. Follow up plan: Return in about 6 months (around 04/19/2022), or if symptoms worsen or fail to improve, for Hypertension anxiety and hyperlipidemia.  Counseling provided for all of the vaccine components Orders Placed This Encounter  Procedures   Varicella-zoster vaccine IM (Shingrix)   CBC with Differential/Platelet   CMP14+EGFR   Lipid panel   TSH    Caryl Pina, MD [facility] 10/18/2021, 9:45 AM    Thanks Delsa Sale

## 2021-10-19 LAB — LIPID PANEL
Chol/HDL Ratio: 2.4 ratio (ref 0.0–4.4)
Cholesterol, Total: 201 mg/dL — ABNORMAL HIGH (ref 100–199)
HDL: 83 mg/dL (ref >39)
LDL Chol Calc (NIH): 99 mg/dL (ref 0–99)
Triglycerides: 108 mg/dL (ref 0–149)
VLDL Cholesterol Cal: 19 mg/dL (ref 5–40)

## 2021-10-19 LAB — CBC WITH DIFFERENTIAL/PLATELET
Basophils Absolute: 0.1 10*3/uL (ref 0.0–0.2)
Basos: 1 %
EOS (ABSOLUTE): 0.4 10*3/uL (ref 0.0–0.4)
Eos: 4 %
Hematocrit: 35.1 % (ref 34.0–46.6)
Hemoglobin: 12.3 g/dL (ref 11.1–15.9)
Immature Grans (Abs): 0 10*3/uL (ref 0.0–0.1)
Immature Granulocytes: 0 %
Lymphocytes Absolute: 1.8 10*3/uL (ref 0.7–3.1)
Lymphs: 21 %
MCH: 32 pg (ref 26.6–33.0)
MCHC: 35 g/dL (ref 31.5–35.7)
MCV: 91 fL (ref 79–97)
Monocytes Absolute: 0.6 10*3/uL (ref 0.1–0.9)
Monocytes: 8 %
Neutrophils Absolute: 5.6 10*3/uL (ref 1.4–7.0)
Neutrophils: 66 %
Platelets: 261 10*3/uL (ref 150–450)
RBC: 3.84 x10E6/uL (ref 3.77–5.28)
RDW: 12.3 % (ref 11.7–15.4)
WBC: 8.5 10*3/uL (ref 3.4–10.8)

## 2021-10-19 LAB — CMP14+EGFR
ALT: 23 IU/L (ref 0–32)
AST: 34 IU/L (ref 0–40)
Albumin/Globulin Ratio: 2.4 — ABNORMAL HIGH (ref 1.2–2.2)
Albumin: 4.5 g/dL (ref 3.6–4.6)
Alkaline Phosphatase: 77 IU/L (ref 44–121)
BUN/Creatinine Ratio: 17 (ref 12–28)
BUN: 18 mg/dL (ref 8–27)
Bilirubin Total: 0.3 mg/dL (ref 0.0–1.2)
CO2: 25 mmol/L (ref 20–29)
Calcium: 9.2 mg/dL (ref 8.7–10.3)
Chloride: 101 mmol/L (ref 96–106)
Creatinine, Ser: 1.03 mg/dL — ABNORMAL HIGH (ref 0.57–1.00)
Globulin, Total: 1.9 g/dL (ref 1.5–4.5)
Glucose: 96 mg/dL (ref 70–99)
Potassium: 4.4 mmol/L (ref 3.5–5.2)
Sodium: 140 mmol/L (ref 134–144)
Total Protein: 6.4 g/dL (ref 6.0–8.5)
eGFR: 52 mL/min/{1.73_m2} — ABNORMAL LOW (ref >59)

## 2021-10-19 LAB — TSH: TSH: 2.09 u[IU]/mL (ref 0.450–4.500)

## 2021-11-06 ENCOUNTER — Ambulatory Visit
Admission: RE | Admit: 2021-11-06 | Discharge: 2021-11-06 | Disposition: A | Discharge status: Home / Self Care | Payer: Medicare Other | Source: Ambulatory Visit | Attending: Family Medicine | Admitting: Family Medicine

## 2021-11-06 DIAGNOSIS — Z1231 Encounter for screening mammogram for malignant neoplasm of breast: Secondary | ICD-10-CM | POA: Diagnosis not present

## 2022-01-24 DIAGNOSIS — M5416 Radiculopathy, lumbar region: Secondary | ICD-10-CM | POA: Diagnosis not present

## 2022-01-26 ENCOUNTER — Ambulatory Visit (INDEPENDENT_AMBULATORY_CARE_PROVIDER_SITE_OTHER): Payer: Medicare Other

## 2022-01-26 VITALS — Wt 147.0 lb

## 2022-01-26 DIAGNOSIS — Z0000 Encounter for general adult medical examination without abnormal findings: Secondary | ICD-10-CM | POA: Diagnosis not present

## 2022-01-26 NOTE — Progress Notes (Signed)
Subjective:   Shelly Nelson is a 88 y.o. female who presents for Medicare Annual (Subsequent) preventive examination.  Virtual Visit via Telephone Note  I connected with  Shelly Nelson on 01/26/22 at  3:30 PM EDT by telephone and verified that I am speaking with the correct person using two identifiers.  Location: Patient: Home Provider: [facility] Persons participating in the virtual visit: patient/Nurse Health Advisor   I discussed the limitations, risks, security and privacy concerns of performing an evaluation and management service by telephone and the availability of in person appointments. The patient expressed understanding and agreed to proceed.  Interactive audio and video telecommunications were attempted between this nurse and patient, however failed, due to patient having technical difficulties OR patient did not have access to video capability.  We continued and completed visit with audio only.  Some vital signs may be absent or patient reported.   Joanny Dupree E Jaxson Keener, LPN   Review of Systems     Cardiac Risk Factors include: advanced age (>45mn, >>47women);dyslipidemia;hypertension;sedentary lifestyle     Objective:    Today's Vitals   01/26/22 1429 01/26/22 1430  Weight: 147 lb (66.7 kg)   PainSc:  5    Body mass index is 26.04 kg/m.     01/26/2022    2:36 PM 01/25/2021    3:47 PM 12/09/2019    2:25 PM 10/07/2018    2:31 PM 06/09/2012   10:11 AM 06/04/2012   11:01 AM  Advanced Directives  Does Patient Have a Medical Advance Directive? No Yes Yes No Patient does not have advance directive Patient does not have advance directive;Patient would not like information  Type of Advance Directive  [place]    Does patient want to make changes to medical advance directive?   No - Patient declined     Copy of [place] Chart?  No - copy requested No - copy requested     Would patient like  information on creating a medical advance directive? No - Patient declined  No - Patient declined No - Patient declined    Pre-existing out of facility DNR order (yellow form or pink MOST form)      No    Current Medications (verified) Outpatient Encounter Medications as of 01/26/2022  Medication Sig   atenolol (TENORMIN) 25 MG tablet Take 1/2 (one-half) tablet by mouth once daily   Calcium Carbonate-Vitamin D (CALCIUM-D) 600-400 MG-UNIT TABS Take 1 tablet by mouth daily.   conjugated estrogens (PREMARIN) vaginal cream INSERT 1/2 (ONE-HALF) GRAM VAGINALLY TWICE A WEEK AT BEDTIME   folic acid (FOLVITE) 8662MCG tablet Take 800 mcg by mouth daily.   Garlic 19476MG CAPS Take 1 capsule by mouth daily.   HYDROcodone-acetaminophen (NORCO) 10-325 MG tablet Take 1 tablet by mouth 3 (three) times daily as needed.   hydrOXYzine (VISTARIL) 25 MG capsule Take 1 capsule (25 mg total) by mouth at bedtime as needed.   loratadine (CLARITIN) 10 MG tablet Take 10 mg by mouth daily as needed.    losartan (COZAAR) 25 MG tablet Take 1 tablet (25 mg total) by mouth daily.   magnesium oxide (MAG-OX) 400 MG tablet Take 400 mg by mouth 2 (two) times daily.   Multiple Vitamin (MULTIVITAMIN WITH MINERALS) TABS Take 1 tablet by mouth daily.   Omega-3 Fatty Acids (FISH OIL) 1200 MG CAPS Take 1,200 mg by mouth daily.   omeprazole (PRILOSEC) 40 MG capsule  Take 1 capsule (40 mg total) by mouth daily.   polyethylene glycol (MIRALAX / GLYCOLAX) 17 g packet Take 17 g by mouth daily.   pravastatin (PRAVACHOL) 40 MG tablet Take 1 tablet (40 mg total) by mouth daily.   psyllium (METAMUCIL) 58.6 % powder Take 1 packet by mouth 2 (two) times daily.   venlafaxine XR (EFFEXOR-XR) 75 MG 24 hr capsule TAKE 1 CAPSULE BY MOUTH ONCE DAILY WITH BREAKFAST   No facility-administered encounter medications on file as of 01/26/2022.    Allergies (verified) Cephalexin   History: Past Medical History:  Diagnosis Date   Allergy    Anxiety     Arthritis    Basal cell carcinoma    GERD (gastroesophageal reflux disease) 2012   Hypercholesteremia 2000   Hypertension 2000   Spinal stenosis, lumbar region, without neurogenic claudication 2012   Squamous cell carcinoma    Thyroid disease 2013   thyroid nodule with needle aspiration   Past Surgical History:  Procedure Laterality Date   BACK SURGERY  11/08/2010   lumbar-bulging disc   CATARACT EXTRACTION     left eye-2011-Dr Hunt   CATARACT EXTRACTION W/PHACO  06/09/2012   Procedure: CATARACT EXTRACTION PHACO AND INTRAOCULAR LENS PLACEMENT ([place]);  Surgeon: Tonny Branch, MD;  Location: [facility];  Service: Ophthalmology;  Laterality: Left;  CDE=13.91   skin cancer removal N/A 09/10/13   THYROID LOBECTOMY  age 80   TONSILLECTOMY     age 58   TUBAL LIGATION     Family History  Problem Relation Age of Onset   Hypertension Mother    Anxiety disorder Mother    Breast cancer Mother 10       metasis to lung and bones   Hypertension Father    COPD Father    Hypertension Maternal Grandmother    Diabetes Maternal Grandmother        type 2   Hypertension Paternal Grandmother    Heart disease Paternal Grandmother    Stroke Maternal Grandfather    COPD Paternal Grandfather    Breast cancer Maternal Aunt    Cancer Maternal Uncle        X 5 with various cancers   Social History   Socioeconomic History   Marital status: Widowed    Spouse name: Gwyndolyn Saxon   Number of children: 1   Years of education: Not on file   Highest education level: High school graduate  Occupational History    Employer: RETIRED  Tobacco Use   Smoking status: Never   Smokeless tobacco: Never  Vaping Use   Vaping Use: Never used  Substance and Sexual Activity   Alcohol use: No   Drug use: No   Sexual activity: Not Currently    Birth control/protection: Post-menopausal, Surgical    Comment: BTL  Other Topics Concern   Not on file  Social History Narrative   Lives in one level home alone   Daughter and  grandchildren all live very close   Social Determinants of Health   Financial Resource Strain: Low Risk  (01/26/2022)   Overall Financial Resource Strain (CARDIA)    Difficulty of Paying Living Expenses: Not hard at all  Food Insecurity: No Food Insecurity (01/26/2022)   Hunger Vital Sign    Worried About Running Out of Food in the Last Year: Never true    Ran Out of Food in the Last Year: Never true  Transportation Needs: No Transportation Needs (01/26/2022)   PRAPARE - Transportation    Lack  of Transportation (Medical): No    Lack of Transportation (Non-Medical): No  Physical Activity: Insufficiently Active (01/26/2022)   Exercise Vital Sign    Days of Exercise per Week: 7 days    Minutes of Exercise per Session: 10 min  Stress: No Stress Concern Present (01/26/2022)   [place]    Feeling of Stress : Only a little  Social Connections: Moderately Integrated (01/26/2022)   Social Connection and Isolation Panel [NHANES]    Frequency of Communication with Friends and Family: More than three times a week    Frequency of Social Gatherings with Friends and Family: More than three times a week    Attends Religious Services: More than 4 times per year    Active Member of [employer] or Organizations: Yes    Attends Archivist Meetings: More than 4 times per year    Marital Status: Widowed    Tobacco Counseling Counseling given: Not Answered   Clinical Intake:  Pre-visit preparation completed: Yes  Pain : 0-10 Pain Score: 5  Pain Type: Chronic pain Pain Location: Back Pain Orientation: Left, Right Pain Radiating Towards: legs Pain Descriptors / Indicators: Aching, Sore, Discomfort Pain Onset: More than a month ago Pain Frequency: Intermittent     BMI - recorded: 26.04 Nutritional Status: BMI 25 -29 Overweight Nutritional Risks: None Diabetes: No  How often do you need to have someone help you when you  read instructions, pamphlets, or other written materials from your doctor or pharmacy?: 1 - Never  Diabetic? no  Interpreter Needed?: No  Information entered by :: Ravenne Wayment, LPN   Activities of Daily Living    01/26/2022    2:35 PM  In your present state of health, do you have any difficulty performing the following activities:  Hearing? 0  Vision? 0  Difficulty concentrating or making decisions? 0  Walking or climbing stairs? 0  Dressing or bathing? 0  Doing errands, shopping? 0  Preparing Food and eating ? N  Using the Toilet? N  In the past six months, have you accidently leaked urine? Y  Comment wears pads for protection  Do you have problems with loss of bowel control? N  Managing your Medications? N  Managing your Finances? N  Housekeeping or managing your Housekeeping? Y    Patient Care Team: Dettinger, Fransisca Kaufmann, MD as PCP - General (Family Medicine) Bjorn Loser, MD as Attending Physician (Urology) Teena Irani, MD (Inactive) as Attending Physician (Gastroenterology) Romine, Lubertha South, MD as Attending Physician (Obstetrics and Gynecology)  Indicate any recent Medical Services you may have received from other than [facility] providers in the past year (date may be approximate).     Assessment:   This is a routine wellness examination for Shelly Nelson.  Hearing/Vision screen Hearing Screening - Comments:: Denies hearing difficulties   Vision Screening - Comments:: Wears rx glasses - up to date with routine eye exams with [facility]  Dietary issues and exercise activities discussed: Current Exercise Habits: The patient does not participate in regular exercise at present, Exercise limited by: orthopedic condition(s)   Goals Addressed               This Visit's Progress     COMPLETED: Patient Stated (pt-stated)        Pt states she is trying to limit snacks between meals and to lose a "few pounds"      Patient Stated        Hopes  to maintain her  independence        Depression Screen    01/26/2022    2:34 PM 10/18/2021    9:24 AM 04/19/2021    8:51 AM 01/25/2021    3:41 PM 10/19/2020    8:33 AM 08/18/2020    3:09 PM 04/19/2020    8:26 AM  PHQ 2/9 Scores  PHQ - 2 Score 0 0 0 0 0 0 0  PHQ- 9 Score  7         Fall Risk    01/26/2022    2:31 PM 10/18/2021    9:24 AM 04/19/2021    8:51 AM 01/25/2021    3:43 PM 10/19/2020    8:33 AM  Fall Risk   Falls in the past year? 0 '1 1 1 1  '$ Number falls in past yr: 0 0 1 0 0  Injury with Fall? 0 0 '1 1 1  '$ Risk for fall due to : Orthopedic patient;Impaired balance/gait Impaired balance/gait Impaired balance/gait History of fall(s);Impaired vision;Medication side effect Impaired balance/gait  Follow up Falls prevention discussed;Education provided Falls evaluation completed Falls evaluation completed Education provided;Falls prevention discussed Falls evaluation completed    FALL RISK PREVENTION PERTAINING TO THE HOME:  Any stairs in or around the home? No  If so, are there any without handrails? No  Home free of loose throw rugs in walkways, pet beds, electrical cords, etc? Yes  Adequate lighting in your home to reduce risk of falls? Yes   ASSISTIVE DEVICES UTILIZED TO PREVENT FALLS:  Life alert? No  Use of a cane, walker or w/c? Yes  Grab bars in the bathroom? No  Shower chair or bench in shower? Yes  Elevated toilet seat or a handicapped toilet? No   TIMED UP AND GO:  Was the test performed? No . Telephonic visit   Cognitive Function:        01/26/2022    2:37 PM 12/09/2019    2:27 PM 10/07/2018    2:34 PM  6CIT Screen  What Year? 0 points 0 points 0 points  What month? 0 points 0 points 0 points  What time? 0 points 0 points 0 points  Count back from 20 0 points 0 points 0 points  Months in reverse 0 points 0 points 0 points  Repeat phrase 2 points 0 points 0 points  Total Score 2 points 0 points 0 points    Immunizations Immunization History  Administered Date(s)  Administered   Fluad Quad(high Dose 65+) 02/27/2019, 02/19/2020, 02/21/2021   Influenza, High Dose Seasonal PF 02/08/2017, 02/20/2018   Influenza,inj,Quad PF,6+ Mos 02/10/2013, 02/18/2014, 02/16/2015, 02/02/2016   Moderna Covid-19 Vaccine Bivalent Booster 79yr & up 03/22/2021   Moderna Sars-Covid-2 Vaccination 06/01/2019, 06/29/2019, 03/23/2020, 09/14/2020   Pneumococcal Conjugate-13 02/18/2014   Pneumococcal Polysaccharide-23 05/14/2005   Pneumococcal-Unspecified 01/27/2021   Tdap 09/10/2012   Unspecified SARS-COV-2 Vaccination 01/18/2021   Zoster Recombinat (Shingrix) 10/19/2020, 10/18/2021   Zoster, Live 02/12/2008    TDAP status: Up to date  Flu Vaccine status: Up to date  Pneumococcal vaccine status: Up to date  Covid-19 vaccine status: Completed vaccines  Qualifies for Shingles Vaccine? Yes   Zostavax completed Yes   Shingrix Completed?: Yes  Screening Tests Health Maintenance  Topic Date Due   COVID-19 Vaccine (7 - Moderna risk series) 05/17/2021   INFLUENZA VACCINE  12/12/2021   TETANUS/TDAP  09/11/2022   MAMMOGRAM  11/07/2022   DEXA SCAN  05/20/2023   Pneumonia Vaccine 88 Years old  Completed   Zoster Vaccines- Shingrix  Completed   HPV VACCINES  Aged Out    Health Maintenance  Health Maintenance Due  Topic Date Due   COVID-19 Vaccine (7 - Moderna risk series) 05/17/2021   INFLUENZA VACCINE  12/12/2021    Colorectal cancer screening: No longer required.   Mammogram status: Completed 11/06/2021. Repeat every year  Bone Density status: Completed 05/19/2021. Results reflect: Bone density results: OSTEOPENIA. Repeat every 2 years.  Lung Cancer Screening: (Low Dose CT Chest recommended if Age 64-80 years, 30 pack-year currently smoking OR have quit w/in 15years.) does not qualify.  Additional Screening:  Hepatitis C Screening: does not qualify  Vision Screening: Recommended annual ophthalmology exams for early detection of glaucoma and other disorders of  the eye. Is the patient up to date with their annual eye exam?  Yes  Who is the provider or what is the name of the office in which the patient attends annual eye exams? [facility] If pt is not established with a provider, would they like to be referred to a provider to establish care? No .   Dental Screening: Recommended annual dental exams for proper oral hygiene  Community Resource Referral / Chronic Care Management: CRR required this visit?  No   CCM required this visit?  No      Plan:     I have personally reviewed and noted the following in the patient's chart:   Medical and social history Use of alcohol, tobacco or illicit drugs  Current medications and supplements including opioid prescriptions. Patient is currently taking opioid prescriptions. Information provided to patient regarding non-opioid alternatives. Patient advised to discuss non-opioid treatment plan with their provider. Functional ability and status Nutritional status Physical activity Advanced directives List of other physicians Hospitalizations, surgeries, and ER visits in previous 12 months Vitals Screenings to include cognitive, depression, and falls Referrals and appointments  In addition, I have reviewed and discussed with patient certain preventive protocols, quality metrics, and best practice recommendations. A written personalized care plan for preventive services as well as general preventive health recommendations were provided to patient.     Sandrea Hammond, LPN   [id number]   Nurse Notes: None

## 2022-01-26 NOTE — Patient Instructions (Addendum)
Shelly Nelson , Thank you for taking time to come for your Medicare Wellness Visit. I appreciate your ongoing commitment to your health goals. Please review the following plan we discussed and let me know if I can assist you in the future.   Screening recommendations/referrals: Colonoscopy: Done 01/29/2018 - no repeat required Mammogram: Done 11/06/2021 - Repeat annually Bone Density: Done 05/19/2021 - Repeat every 2 years Recommended yearly ophthalmology/optometry visit for glaucoma screening and checkup Recommended yearly dental visit for hygiene and checkup  Vaccinations: Influenza vaccine: Done 02/21/2021 - Repeat annually  Pneumococcal vaccine: Done 02/18/2014 & 01/27/2021 Tdap vaccine: Done 09/10/2012 - Repeat in 10 years  Shingles vaccine: Done 10/19/2020 & 10/18/2021   Covid-19: Complete - consider getting a booster Fall 2023  Advanced directives: Advance directive discussed with you today. Even though you declined this today, please call our office should you change your mind, and we can give you the proper paperwork for you to fill out.   Conditions/risks identified: Each day, aim for 6 glasses of water, plenty of protein in your diet and try to get up and walk/ stretch every hour for 5-10 minutes at a time.    Next appointment: Follow up in one year for your annual wellness visit   Managing Pain Without Opioids Opioids are strong medicines used to treat moderate to severe pain. For some people, especially those who have long-term (chronic) pain, opioids may not be the best choice for pain management due to: Side effects like nausea, constipation, and sleepiness. The risk of addiction (opioid use disorder). The longer you take opioids, the greater your risk of addiction. Pain that lasts for more than 3 months is called chronic pain. Managing chronic pain usually requires more than one approach and is often provided by a team of health care providers working together (multidisciplinary  approach). Pain management may be done at a pain management center or pain clinic. How to manage pain without the use of opioids Use non-opioid medicines Non-opioid medicines for pain may include: Over-the-counter or prescription non-steroidal anti-inflammatory drugs (NSAIDs). These may be the first medicines used for pain. They work well for muscle and bone pain, and they reduce swelling. Acetaminophen. This over-the-counter medicine may work well for milder pain but not swelling. Antidepressants. These may be used to treat chronic pain. A certain type of antidepressant (tricyclics) is often used. These medicines are given in lower doses for pain than when used for depression. Anticonvulsants. These are usually used to treat seizures but may also reduce nerve (neuropathic) pain. Muscle relaxants. These relieve pain caused by sudden muscle tightening (spasms). You may also use a pain medicine that is applied to the skin as a patch, cream, or gel (topical analgesic), such as a numbing medicine. These may cause fewer side effects than medicines taken by mouth. Do certain therapies as directed Some therapies can help with pain management. They include: Physical therapy. You will do exercises to gain strength and flexibility. A physical therapist may teach you exercises to move and stretch parts of your body that are weak, stiff, or painful. You can learn these exercises at physical therapy visits and practice them at home. Physical therapy may also involve: Massage. Heat wraps or applying heat or cold to affected areas. Electrical signals that interrupt pain signals (transcutaneous electrical nerve stimulation, TENS). Weak lasers that reduce pain and swelling (low-level laser therapy). Signals from your body that help you learn to regulate pain (biofeedback). Occupational therapy. This helps you to learn ways  to function at home and work with less pain. Recreational therapy. This involves trying new  activities or hobbies, such as a physical activity or drawing. Mental health therapy, including: Cognitive behavioral therapy (CBT). This helps you learn coping skills for dealing with pain. Acceptance and commitment therapy (ACT) to change the way you think and react to pain. Relaxation therapies, including muscle relaxation exercises and mindfulness-based stress reduction. Pain management counseling. This may be individual, family, or group counseling.  Receive medical treatments Medical treatments for pain management include: Nerve block injections. These may include a pain blocker and anti-inflammatory medicines. You may have injections: Near the spine to relieve chronic back or neck pain. Into joints to relieve back or joint pain. Into nerve areas that supply a painful area to relieve body pain. Into muscles (trigger point injections) to relieve some painful muscle conditions. A medical device placed near your spine to help block pain signals and relieve nerve pain or chronic back pain (spinal cord stimulation device). Acupuncture. Follow these instructions at home Medicines Take over-the-counter and prescription medicines only as told by your health care provider. If you are taking pain medicine, ask your health care providers about possible side effects to watch out for. Do not drive or use heavy machinery while taking prescription opioid pain medicine. Lifestyle  Do not use drugs or alcohol to reduce pain. If you drink alcohol, limit how much you have to: 0-1 drink a day for women who are not pregnant. 0-2 drinks a day for men. Know how much alcohol is in a drink. In the [place] one drink equals one 12 oz bottle of beer (355 mL), one 5 oz glass of wine (148 mL), or one 1 oz glass of hard liquor (44 mL). Do not use any products that contain nicotine or tobacco. These products include cigarettes, chewing tobacco, and vaping devices, such as e-cigarettes. If you need help quitting, ask  your health care provider. Eat a healthy diet and maintain a healthy weight. Poor diet and excess weight may make pain worse. Eat foods that are high in fiber. These include fresh fruits and vegetables, whole grains, and beans. Limit foods that are high in fat and processed sugars, such as fried and sweet foods. Exercise regularly. Exercise lowers stress and may help relieve pain. Ask your health care provider what activities and exercises are safe for you. If your health care provider approves, join an exercise class that combines movement and stress reduction. Examples include yoga and tai chi. Get enough sleep. Lack of sleep may make pain worse. Lower stress as much as possible. Practice stress reduction techniques as told by your therapist. General instructions Work with all your pain management providers to find the treatments that work best for you. You are an important member of your pain management team. There are many things you can do to reduce pain on your own. Consider joining an online or in-person support group for people who have chronic pain. Keep all follow-up visits. This is important. Where to find more information You can find more information about managing pain without opioids from: American Academy of Pain Medicine: painmed.[place] for Chronic Pain: instituteforchronicpain.org American Chronic Pain Association: theacpa.org Contact a health care provider if: You have side effects from pain medicine. Your pain gets worse or does not get better with treatments or home therapy. You are struggling with anxiety or depression. Summary Many types of pain can be managed without opioids. Chronic pain may respond better to pain management  without opioids. Pain is best managed when you and a team of health care providers work together. Pain management without opioids may include non-opioid medicines, medical treatments, physical therapy, mental health therapy, and lifestyle  changes. Tell your health care providers if your pain gets worse or is not being managed well enough. This information is not intended to replace advice given to you by your health care provider. Make sure you discuss any questions you have with your health care provider. Document Revised: 08/10/2020 Document Reviewed: 08/10/2020 Elsevier Patient Education  [place] 65 Years and Older, Female Preventive care refers to lifestyle choices and visits with your health care provider that can promote health and wellness. What does preventive care include? A yearly physical exam. This is also called an annual well check. Dental exams once or twice a year. Routine eye exams. Ask your health care provider how often you should have your eyes checked. Personal lifestyle choices, including: Daily care of your teeth and gums. Regular physical activity. Eating a healthy diet. Avoiding tobacco and drug use. Limiting alcohol use. Practicing safe sex. Taking low-dose aspirin every day. Taking vitamin and mineral supplements as recommended by your health care provider. What happens during an annual well check? The services and screenings done by your health care provider during your annual well check will depend on your age, overall health, lifestyle risk factors, and family history of disease. Counseling  Your health care provider may ask you questions about your: Alcohol use. Tobacco use. Drug use. Emotional well-being. Home and relationship well-being. Sexual activity. Eating habits. History of falls. Memory and ability to understand (cognition). Work and work Statistician. Reproductive health. Screening  You may have the following tests or measurements: Height, weight, and BMI. Blood pressure. Lipid and cholesterol levels. These may be checked every 5 years, or more frequently if you are over 51 years old. Skin check. Lung cancer screening. You may have this  screening every year starting at age 51 if you have a 30-pack-year history of smoking and currently smoke or have quit within the past 15 years. Fecal occult blood test (FOBT) of the stool. You may have this test every year starting at age 4. Flexible sigmoidoscopy or colonoscopy. You may have a sigmoidoscopy every 5 years or a colonoscopy every 10 years starting at age 2. Hepatitis C blood test. Hepatitis B blood test. Sexually transmitted disease (STD) testing. Diabetes screening. This is done by checking your blood sugar (glucose) after you have not eaten for a while (fasting). You may have this done every 1-3 years. Bone density scan. This is done to screen for osteoporosis. You may have this done starting at age 37. Mammogram. This may be done every 1-2 years. Talk to your health care provider about how often you should have regular mammograms. Talk with your health care provider about your test results, treatment options, and if necessary, the need for more tests. Vaccines  Your health care provider may recommend certain vaccines, such as: Influenza vaccine. This is recommended every year. Tetanus, diphtheria, and acellular pertussis (Tdap, Td) vaccine. You may need a Td booster every 10 years. Zoster vaccine. You may need this after age 57. Pneumococcal 13-valent conjugate (PCV13) vaccine. One dose is recommended after age 7. Pneumococcal polysaccharide (PPSV23) vaccine. One dose is recommended after age 67. Talk to your health care provider about which screenings and vaccines you need and how often you need them. This information is not intended to replace advice given  to you by your health care provider. Make sure you discuss any questions you have with your health care provider. Document Released: 05/27/2015 Document Revised: 01/18/2016 Document Reviewed: 03/01/2015 Elsevier Interactive Patient Education  2017 [place] Prevention in the Home Falls can cause injuries.  They can happen to people of all ages. There are many things you can do to make your home safe and to help prevent falls. What can I do on the outside of my home? Regularly fix the edges of walkways and driveways and fix any cracks. Remove anything that might make you trip as you walk through a door, such as a raised step or threshold. Trim any bushes or trees on the path to your home. Use bright outdoor lighting. Clear any walking paths of anything that might make someone trip, such as rocks or tools. Regularly check to see if handrails are loose or broken. Make sure that both sides of any steps have handrails. Any raised decks and porches should have guardrails on the edges. Have any leaves, snow, or ice cleared regularly. Use sand or salt on walking paths during winter. Clean up any spills in your garage right away. This includes oil or grease spills. What can I do in the bathroom? Use night lights. Install grab bars by the toilet and in the tub and shower. Do not use towel bars as grab bars. Use non-skid mats or decals in the tub or shower. If you need to sit down in the shower, use a plastic, non-slip stool. Keep the floor dry. Clean up any water that spills on the floor as soon as it happens. Remove soap buildup in the tub or shower regularly. Attach bath mats securely with double-sided non-slip rug tape. Do not have throw rugs and other things on the floor that can make you trip. What can I do in the bedroom? Use night lights. Make sure that you have a light by your bed that is easy to reach. Do not use any sheets or blankets that are too big for your bed. They should not hang down onto the floor. Have a firm chair that has side arms. You can use this for support while you get dressed. Do not have throw rugs and other things on the floor that can make you trip. What can I do in the kitchen? Clean up any spills right away. Avoid walking on wet floors. Keep items that you use a lot  in easy-to-reach places. If you need to reach something above you, use a strong step stool that has a grab bar. Keep electrical cords out of the way. Do not use floor polish or wax that makes floors slippery. If you must use wax, use non-skid floor wax. Do not have throw rugs and other things on the floor that can make you trip. What can I do with my stairs? Do not leave any items on the stairs. Make sure that there are handrails on both sides of the stairs and use them. Fix handrails that are broken or loose. Make sure that handrails are as long as the stairways. Check any carpeting to make sure that it is firmly attached to the stairs. Fix any carpet that is loose or worn. Avoid having throw rugs at the top or bottom of the stairs. If you do have throw rugs, attach them to the floor with carpet tape. Make sure that you have a light switch at the top of the stairs and the bottom of the  stairs. If you do not have them, ask someone to add them for you. What else can I do to help prevent falls? Wear shoes that: Do not have high heels. Have rubber bottoms. Are comfortable and fit you well. Are closed at the toe. Do not wear sandals. If you use a stepladder: Make sure that it is fully opened. Do not climb a closed stepladder. Make sure that both sides of the stepladder are locked into place. Ask someone to hold it for you, if possible. Clearly mark and make sure that you can see: Any grab bars or handrails. First and last steps. Where the edge of each step is. Use tools that help you move around (mobility aids) if they are needed. These include: Canes. Walkers. Scooters. Crutches. Turn on the lights when you go into a dark area. Replace any light bulbs as soon as they burn out. Set up your furniture so you have a clear path. Avoid moving your furniture around. If any of your floors are uneven, fix them. If there are any pets around you, be aware of where they are. Review your medicines  with your doctor. Some medicines can make you feel dizzy. This can increase your chance of falling. Ask your doctor what other things that you can do to help prevent falls. This information is not intended to replace advice given to you by your health care provider. Make sure you discuss any questions you have with your health care provider. Document Released: 02/24/2009 Document Revised: 10/06/2015 Document Reviewed: 06/04/2014 Elsevier Interactive Patient Education  2017 [employer].

## 2022-03-12 ENCOUNTER — Ambulatory Visit (INDEPENDENT_AMBULATORY_CARE_PROVIDER_SITE_OTHER): Payer: Medicare Other

## 2022-03-12 DIAGNOSIS — Z23 Encounter for immunization: Secondary | ICD-10-CM | POA: Diagnosis not present

## 2022-03-30 ENCOUNTER — Ambulatory Visit (INDEPENDENT_AMBULATORY_CARE_PROVIDER_SITE_OTHER): Payer: Medicare Other | Admitting: Nurse Practitioner

## 2022-03-30 ENCOUNTER — Other Ambulatory Visit: Payer: Self-pay

## 2022-03-30 ENCOUNTER — Encounter: Payer: Self-pay | Admitting: Nurse Practitioner

## 2022-03-30 DIAGNOSIS — Z20822 Contact with and (suspected) exposure to covid-19: Secondary | ICD-10-CM

## 2022-03-30 DIAGNOSIS — J069 Acute upper respiratory infection, unspecified: Secondary | ICD-10-CM

## 2022-03-30 MED ORDER — GUAIFENESIN ER 600 MG PO TB12: 600.0000 mg | ORAL_TABLET | Freq: Two times a day (BID) | ORAL | 0 refills | Status: DC

## 2022-03-30 MED ORDER — NIRMATRELVIR/RITONAVIR (PAXLOVID)TABLET
3.0000 | ORAL_TABLET | Freq: Two times a day (BID) | ORAL | 0 refills | Status: AC
Start: 2022-03-30 — End: 2022-04-04

## 2022-03-30 MED ORDER — MOLNUPIRAVIR EUA 200MG CAPSULE: 4.0000 | ORAL_CAPSULE | Freq: Two times a day (BID) | ORAL | 0 refills | Status: DC

## 2022-03-30 NOTE — Addendum Note (Signed)
Addended by: Ivy Lynn on: 03/30/2022 11:36 AM   Modules accepted: Orders

## 2022-03-30 NOTE — Progress Notes (Addendum)
   Virtual Visit  Note Due to COVID-19 pandemic this visit was conducted virtually. This visit type was conducted due to national recommendations for restrictions regarding the COVID-19 Pandemic (e.g. social distancing, sheltering in place) in an effort to limit this patient's exposure and mitigate transmission in our community. All issues noted in this document were discussed and addressed.  A physical exam was not performed with this format.  I connected with Shelly Nelson on 03/30/22 at 9 AM by telephone and verified that I am speaking with the correct person using two identifiers. Shelly Nelson is currently located at home during visit. The provider, Ivy Lynn, NP is located in their office at time of visit.  I discussed the limitations, risks, security and privacy concerns of performing an evaluation and management service by telephone and the availability of in person appointments. I also discussed with the patient that there may be a patient responsible charge related to this service. The patient expressed understanding and agreed to proceed.   History and Present Illness:  URI  This is a new problem. The current episode started yesterday. The problem has been unchanged. There has been no fever. Associated symptoms include congestion, coughing and headaches. Pertinent negatives include no abdominal pain, diarrhea, joint swelling, nausea, rash, sinus pain, sneezing, sore throat or vomiting. She has tried nothing for the symptoms.      Review of Systems  Constitutional:  Positive for malaise/fatigue. Negative for chills and fever.  HENT:  Positive for congestion. Negative for sinus pain, sneezing and sore throat.   Respiratory:  Positive for cough.   Gastrointestinal:  Negative for abdominal pain, diarrhea, nausea and vomiting.  Skin: Negative.  Negative for itching and rash.  Neurological:  Positive for headaches.  All other systems reviewed and are  negative.    Observations/Objective: Televisit patient not in distress.  Assessment and Plan: Patient exposed to confirmed COVID 19.  Presenting with cough, body aches, headache and congestion.  Symptoms present in the past 24 hours.  Am treating patient with molnupiravir, as renal dosing for Paxlovid cannot be given due to patient GFR lab values longer than 21 days. Advised patient to take meds as prescribed, education provided to patient over the phone. - Use a cool mist humidifier  -Use saline nose sprays frequently -Force fluids -For fever or aches or pains- take Tylenol   Follow up with worsening unresolved symptoms    Follow Up Instructions: Follow-up with unresolved symptoms.    I discussed the assessment and treatment plan with the patient. The patient was provided an opportunity to ask questions and all were answered. The patient agreed with the plan and demonstrated an understanding of the instructions.   The patient was advised to call back or seek an in-person evaluation if the symptoms worsen or if the condition fails to improve as anticipated.  The above assessment and management plan was discussed with the patient. The patient verbalized understanding of and has agreed to the management plan. Patient is aware to call the clinic if symptoms persist or worsen. Patient is aware when to return to the clinic for a follow-up visit. Patient educated on when it is appropriate to go to the emergency department.   Time call ended:  9:12 am   I provided 12 minutes of  non face-to-face time during this encounter.    Ivy Lynn, NP

## 2022-03-30 NOTE — Patient Instructions (Signed)

## 2022-03-31 LAB — CMP14+EGFR
ALT: 25 IU/L (ref 0–32)
AST: 37 IU/L (ref 0–40)
Albumin/Globulin Ratio: 2.2 (ref 1.2–2.2)
Albumin: 4.4 g/dL (ref 3.6–4.6)
Alkaline Phosphatase: 78 IU/L (ref 44–121)
BUN/Creatinine Ratio: 11 — ABNORMAL LOW (ref 12–28)
BUN: 12 mg/dL (ref 10–36)
Bilirubin Total: <0.2 mg/dL (ref 0.0–1.2)
CO2: 23 mmol/L (ref 20–29)
Calcium: 9.1 mg/dL (ref 8.7–10.3)
Chloride: 101 mmol/L (ref 96–106)
Creatinine, Ser: 1.12 mg/dL — ABNORMAL HIGH (ref 0.57–1.00)
Globulin, Total: 2 g/dL (ref 1.5–4.5)
Glucose: 104 mg/dL — ABNORMAL HIGH (ref 70–99)
Potassium: 4.3 mmol/L (ref 3.5–5.2)
Sodium: 139 mmol/L (ref 134–144)
Total Protein: 6.4 g/dL (ref 6.0–8.5)
eGFR: 47 mL/min/{1.73_m2} — ABNORMAL LOW (ref >59)

## 2022-04-19 ENCOUNTER — Encounter: Payer: Self-pay | Admitting: Family Medicine

## 2022-04-19 ENCOUNTER — Ambulatory Visit (INDEPENDENT_AMBULATORY_CARE_PROVIDER_SITE_OTHER): Payer: Medicare Other | Admitting: Family Medicine

## 2022-04-19 VITALS — BP 169/73 | HR 78 | Ht 63.0 in | Wt 149.0 lb

## 2022-04-19 DIAGNOSIS — E039 Hypothyroidism, unspecified: Secondary | ICD-10-CM

## 2022-04-19 DIAGNOSIS — I10 Essential (primary) hypertension: Secondary | ICD-10-CM | POA: Diagnosis not present

## 2022-04-19 DIAGNOSIS — N952 Postmenopausal atrophic vaginitis: Secondary | ICD-10-CM

## 2022-04-19 DIAGNOSIS — E7800 Pure hypercholesterolemia, unspecified: Secondary | ICD-10-CM | POA: Diagnosis not present

## 2022-04-19 MED ORDER — PREMARIN 0.625 MG/GM VA CREA: TOPICAL_CREAM | VAGINAL | 5 refills | Status: DC

## 2022-04-19 MED ORDER — PRAVASTATIN SODIUM 40 MG PO TABS: 40.0000 mg | ORAL_TABLET | Freq: Every day | ORAL | 3 refills | Status: DC

## 2022-04-19 MED ORDER — OMEPRAZOLE 40 MG PO CPDR: 40.0000 mg | DELAYED_RELEASE_CAPSULE | Freq: Every day | ORAL | 3 refills | Status: DC

## 2022-04-19 MED ORDER — LOSARTAN POTASSIUM 25 MG PO TABS: 25.0000 mg | ORAL_TABLET | Freq: Every day | ORAL | 3 refills | Status: DC

## 2022-04-19 MED ORDER — ATENOLOL 25 MG PO TABS: ORAL_TABLET | ORAL | 3 refills | Status: DC

## 2022-04-19 NOTE — Progress Notes (Signed)
BP (!) 169/73   Pulse 78   Ht 5' 3" (1.6 m)   Wt 149 lb (67.6 kg)   LMP 05/14/2001 (Approximate)   SpO2 95%   BMI 26.39 kg/m    Subjective:   Patient ID: Shelly Nelson, female    DOB: 13-Oct-1929, 88 y.o.   MRN: [id number]  HPI: Nil Bolser is a 88 y.o. female presenting on 04/19/2022 for Medical Management of Chronic Issues, Hypertension, and Hypothyroidism   HPI Hypertension Patient is currently on Atenolol and losartan, and their blood pressure today is 169/73, at home she typically runs in the 140s and 130s and has multiple home readings on paper with her today. Patient denies any lightheadedness or dizziness. Patient denies headaches, blurred vision, chest pains, shortness of breath, or weakness. Denies any side effects from medication and is content with current medication.   Hyperlipidemia Patient is coming in for recheck of his hyperlipidemia. The patient is currently taking pravastatin and fish oil. They deny any issues with myalgias or history of liver damage from it. They deny any focal numbness or weakness or chest pain.   Hypothyroidism recheck Patient is coming in for thyroid recheck today as well. They deny any issues with hair changes or heat or cold problems or diarrhea or constipation. They deny any chest pain or palpitations. They are currently on no medicine right now and has been subclinical and we are monitoring  Relevant past medical, surgical, family and social history reviewed and updated as indicated. Interim medical history since our last visit reviewed. Allergies and medications reviewed and updated.  Review of Systems  Constitutional:  Negative for chills and fever.  Eyes:  Negative for visual disturbance.  Respiratory:  Negative for chest tightness and shortness of breath.   Cardiovascular:  Negative for chest pain and leg swelling.  Genitourinary:  Negative for difficulty urinating and dysuria.  Musculoskeletal:  Negative for back  pain and gait problem.  Skin:  Negative for rash.  Neurological:  Negative for dizziness, light-headedness and headaches.  Psychiatric/Behavioral:  Negative for agitation and behavioral problems.   All other systems reviewed and are negative.   Per HPI unless specifically indicated above   Allergies as of 04/19/2022       Reactions   Cephalexin Hives        Medication List        Accurate as of April 19, 2022  9:44 AM. If you have any questions, ask your nurse or doctor.          atenolol 25 MG tablet Commonly known as: TENORMIN Take 1/2 (one-half) tablet by mouth once daily   Calcium-D 600-400 MG-UNIT Tabs Take 1 tablet by mouth daily.   Fish Oil 1200 MG Caps Take 1,200 mg by mouth daily.   folic acid 003 MCG tablet Commonly known as: FOLVITE Take 800 mcg by mouth daily.   Garlic 7048 MG Caps Take 1 capsule by mouth daily.   guaiFENesin 600 MG 12 hr tablet Commonly known as: Mucinex Take 1 tablet (600 mg total) by mouth 2 (two) times daily.   HYDROcodone-acetaminophen 10-325 MG tablet Commonly known as: NORCO Take 1 tablet by mouth 3 (three) times daily as needed.   hydrOXYzine 25 MG capsule Commonly known as: VISTARIL Take 1 capsule (25 mg total) by mouth at bedtime as needed.   loratadine 10 MG tablet Commonly known as: CLARITIN Take 10 mg by mouth daily as needed.   losartan 25 MG tablet Commonly known  as: COZAAR Take 1 tablet (25 mg total) by mouth daily.   magnesium oxide 400 MG tablet Commonly known as: MAG-OX Take 400 mg by mouth 2 (two) times daily.   multivitamin with minerals Tabs tablet Take 1 tablet by mouth daily.   omeprazole 40 MG capsule Commonly known as: PRILOSEC Take 1 capsule (40 mg total) by mouth daily.   polyethylene glycol 17 g packet Commonly known as: MIRALAX / GLYCOLAX Take 17 g by mouth daily.   pravastatin 40 MG tablet Commonly known as: PRAVACHOL Take 1 tablet (40 mg total) by mouth daily.   Premarin  vaginal cream Generic drug: conjugated estrogens INSERT 1/2 (ONE-HALF) GRAM VAGINALLY TWICE A WEEK AT BEDTIME   psyllium 58.6 % powder Commonly known as: METAMUCIL Take 1 packet by mouth 2 (two) times daily.   venlafaxine XR 75 MG 24 hr capsule Commonly known as: EFFEXOR-XR TAKE 1 CAPSULE BY MOUTH ONCE DAILY WITH BREAKFAST         Objective:   BP (!) 169/73   Pulse 78   Ht 5' 3" (1.6 m)   Wt 149 lb (67.6 kg)   LMP 05/14/2001 (Approximate)   SpO2 95%   BMI 26.39 kg/m   Wt Readings from Last 3 Encounters:  04/19/22 149 lb (67.6 kg)  01/26/22 147 lb (66.7 kg)  10/18/21 147 lb (66.7 kg)    Physical Exam Vitals and nursing note reviewed.  Constitutional:      General: She is not in acute distress.    Appearance: She is well-developed. She is not diaphoretic.  Eyes:     Conjunctiva/sclera: Conjunctivae normal.  Cardiovascular:     Rate and Rhythm: Normal rate and regular rhythm.     Heart sounds: Normal heart sounds. No murmur heard. Pulmonary:     Effort: Pulmonary effort is normal. No respiratory distress.     Breath sounds: Normal breath sounds. No wheezing.  Musculoskeletal:        General: No tenderness. Normal range of motion.  Skin:    General: Skin is warm and dry.     Findings: No rash.  Neurological:     Mental Status: She is alert and oriented to person, place, and time.     Coordination: Coordination normal.  Psychiatric:        Behavior: Behavior normal.       Assessment & Plan:   Problem List Items Addressed This Visit       Cardiovascular and Mediastinum   White coat syndrome with diagnosis of hypertension   Relevant Medications   atenolol (TENORMIN) 25 MG tablet   pravastatin (PRAVACHOL) 40 MG tablet   losartan (COZAAR) 25 MG tablet   Essential hypertension - Primary   Relevant Medications   atenolol (TENORMIN) 25 MG tablet   pravastatin (PRAVACHOL) 40 MG tablet   losartan (COZAAR) 25 MG tablet   Other Relevant Orders   CBC with  Differential/Platelet   CMP14+EGFR     Endocrine   Hypothyroid   Relevant Medications   atenolol (TENORMIN) 25 MG tablet   Other Relevant Orders   TSH     Other   HLD (hyperlipidemia)   Relevant Medications   atenolol (TENORMIN) 25 MG tablet   pravastatin (PRAVACHOL) 40 MG tablet   losartan (COZAAR) 25 MG tablet   Other Relevant Orders   Lipid panel   Other Visit Diagnoses     Post-menopausal atrophic vaginitis       Relevant Medications   conjugated estrogens (PREMARIN) vaginal  cream       Continue current medicine, no changes.  Follow up plan: Return in about 6 months (around 10/19/2022), or if symptoms worsen or fail to improve, for Hypertension and cholesterol recheck.  Counseling provided for all of the vaccine components Orders Placed This Encounter  Procedures   CBC with Differential/Platelet   CMP14+EGFR   Lipid panel   TSH    Caryl Pina, MD [facility] 04/19/2022, 9:44 AM

## 2022-04-20 LAB — LIPID PANEL
Chol/HDL Ratio: 2.6 ratio (ref 0.0–4.4)
Cholesterol, Total: 183 mg/dL (ref 100–199)
HDL: 70 mg/dL (ref >39)
LDL Chol Calc (NIH): 91 mg/dL (ref 0–99)
Triglycerides: 126 mg/dL (ref 0–149)
VLDL Cholesterol Cal: 22 mg/dL (ref 5–40)

## 2022-04-20 LAB — CMP14+EGFR
ALT: 17 IU/L (ref 0–32)
AST: 29 IU/L (ref 0–40)
Albumin/Globulin Ratio: 2 (ref 1.2–2.2)
Albumin: 4.3 g/dL (ref 3.6–4.6)
Alkaline Phosphatase: 70 IU/L (ref 44–121)
BUN/Creatinine Ratio: 16 (ref 12–28)
BUN: 18 mg/dL (ref 10–36)
Bilirubin Total: 0.3 mg/dL (ref 0.0–1.2)
CO2: 25 mmol/L (ref 20–29)
Calcium: 9.3 mg/dL (ref 8.7–10.3)
Chloride: 100 mmol/L (ref 96–106)
Creatinine, Ser: 1.14 mg/dL — ABNORMAL HIGH (ref 0.57–1.00)
Globulin, Total: 2.2 g/dL (ref 1.5–4.5)
Glucose: 106 mg/dL — ABNORMAL HIGH (ref 70–99)
Potassium: 4 mmol/L (ref 3.5–5.2)
Sodium: 140 mmol/L (ref 134–144)
Total Protein: 6.5 g/dL (ref 6.0–8.5)
eGFR: 46 mL/min/{1.73_m2} — ABNORMAL LOW (ref >59)

## 2022-04-20 LAB — CBC WITH DIFFERENTIAL/PLATELET
Basophils Absolute: 0.1 10*3/uL (ref 0.0–0.2)
Basos: 1 %
EOS (ABSOLUTE): 0.2 10*3/uL (ref 0.0–0.4)
Eos: 2 %
Hematocrit: 34.7 % (ref 34.0–46.6)
Hemoglobin: 11.7 g/dL (ref 11.1–15.9)
Immature Grans (Abs): 0 10*3/uL (ref 0.0–0.1)
Immature Granulocytes: 0 %
Lymphocytes Absolute: 1.7 10*3/uL (ref 0.7–3.1)
Lymphs: 20 %
MCH: 31 pg (ref 26.6–33.0)
MCHC: 33.7 g/dL (ref 31.5–35.7)
MCV: 92 fL (ref 79–97)
Monocytes Absolute: 0.5 10*3/uL (ref 0.1–0.9)
Monocytes: 6 %
Neutrophils Absolute: 6.1 10*3/uL (ref 1.4–7.0)
Neutrophils: 71 %
Platelets: 281 10*3/uL (ref 150–450)
RBC: 3.78 x10E6/uL (ref 3.77–5.28)
RDW: 12.5 % (ref 11.7–15.4)
WBC: 8.6 10*3/uL (ref 3.4–10.8)

## 2022-04-20 LAB — TSH: TSH: 1.2 u[IU]/mL (ref 0.450–4.500)

## 2022-05-29 DIAGNOSIS — M5416 Radiculopathy, lumbar region: Secondary | ICD-10-CM | POA: Diagnosis not present

## 2022-08-24 DIAGNOSIS — M170 Bilateral primary osteoarthritis of knee: Secondary | ICD-10-CM | POA: Diagnosis not present

## 2022-09-25 ENCOUNTER — Other Ambulatory Visit: Payer: Self-pay | Admitting: Family Medicine

## 2022-09-25 DIAGNOSIS — Z1231 Encounter for screening mammogram for malignant neoplasm of breast: Secondary | ICD-10-CM

## 2022-09-27 DIAGNOSIS — M25561 Pain in right knee: Secondary | ICD-10-CM | POA: Diagnosis not present

## 2022-09-27 DIAGNOSIS — Z79899 Other long term (current) drug therapy: Secondary | ICD-10-CM | POA: Diagnosis not present

## 2022-09-27 DIAGNOSIS — M25562 Pain in left knee: Secondary | ICD-10-CM | POA: Diagnosis not present

## 2022-09-27 DIAGNOSIS — Z5181 Encounter for therapeutic drug level monitoring: Secondary | ICD-10-CM | POA: Diagnosis not present

## 2022-09-27 DIAGNOSIS — M5416 Radiculopathy, lumbar region: Secondary | ICD-10-CM | POA: Diagnosis not present

## 2022-10-18 DIAGNOSIS — M170 Bilateral primary osteoarthritis of knee: Secondary | ICD-10-CM | POA: Diagnosis not present

## 2022-10-19 ENCOUNTER — Ambulatory Visit (INDEPENDENT_AMBULATORY_CARE_PROVIDER_SITE_OTHER): Payer: Medicare Other | Admitting: Family Medicine

## 2022-10-19 ENCOUNTER — Encounter: Payer: Self-pay | Admitting: Family Medicine

## 2022-10-19 VITALS — BP 135/73 | HR 78 | Ht 63.0 in | Wt 149.0 lb

## 2022-10-19 DIAGNOSIS — I10 Essential (primary) hypertension: Secondary | ICD-10-CM | POA: Diagnosis not present

## 2022-10-19 DIAGNOSIS — E039 Hypothyroidism, unspecified: Secondary | ICD-10-CM | POA: Diagnosis not present

## 2022-10-19 DIAGNOSIS — E7800 Pure hypercholesterolemia, unspecified: Secondary | ICD-10-CM

## 2022-10-19 DIAGNOSIS — F419 Anxiety disorder, unspecified: Secondary | ICD-10-CM | POA: Diagnosis not present

## 2022-10-19 LAB — TSH

## 2022-10-19 MED ORDER — VENLAFAXINE HCL ER 75 MG PO CP24: ORAL_CAPSULE | ORAL | 3 refills | Status: DC

## 2022-10-19 MED ORDER — HYDROXYZINE PAMOATE 25 MG PO CAPS: 25.0000 mg | ORAL_CAPSULE | Freq: Every evening | ORAL | 3 refills | Status: DC | PRN

## 2022-10-19 NOTE — Progress Notes (Signed)
BP 135/73   Pulse 78   Ht 5\' 3"  (1.6 m)   Wt 149 lb (67.6 kg)   LMP 05/14/2001 (Approximate)   SpO2 95%   BMI 26.39 kg/m    Subjective:   Patient ID: Shelly Nelson, female    DOB: 12-24-1929, 89 y.o.   MRN: [id number]  HPI: Shelly Nelson is a 89 y.o. female presenting on 10/19/2022 for Medical Management of Chronic Issues, Hypertension, and Hyperlipidemia   HPI Hyperlipidemia Patient is coming in for recheck of his hyperlipidemia. The patient is currently taking fish oils and pravastatin. They deny any issues with myalgias or history of liver damage from it. They deny any focal numbness or weakness or chest pain.   Hypertension Patient is currently on atenolol and losartan, and their blood pressure today is 135/73. Patient denies any lightheadedness or dizziness. Patient denies headaches, blurred vision, chest pains, shortness of breath, or weakness. Denies any side effects from medication and is content with current medication.   Hypothyroidism recheck Patient is coming in for thyroid recheck today as well. They deny any issues with hair changes or heat or cold problems or diarrhea or constipation. They deny any chest pain or palpitations. They are currently on no medicine currently, has been subclinical, monitoring  Anxiety recheck Patient is coming in for anxiety recheck.  She currently takes Effexor and uses hydroxyzine as needed and feels like he is doing very well on it.  She recently medication and is very happy and content with how she is doing in life.  Relevant past medical, surgical, family and social history reviewed and updated as indicated. Interim medical history since our last visit reviewed. Allergies and medications reviewed and updated.  Review of Systems  Constitutional:  Negative for chills and fever.  Eyes:  Negative for redness and visual disturbance.  Respiratory:  Negative for chest tightness and shortness of breath.   Cardiovascular:   Negative for chest pain and leg swelling.  Genitourinary:  Negative for difficulty urinating and dysuria.  Musculoskeletal:  Negative for back pain and gait problem.  Skin:  Negative for rash.  Neurological:  Negative for dizziness, light-headedness and headaches.  Psychiatric/Behavioral:  Negative for agitation and behavioral problems.   All other systems reviewed and are negative.   Per HPI unless specifically indicated above   Allergies as of 10/19/2022       Reactions   Cephalexin Hives        Medication List        Accurate as of October 19, 2022  9:40 AM. If you have any questions, ask your nurse or doctor.          STOP taking these medications    folic acid 800 MCG tablet Commonly known as: FOLVITE Stopped by: Elige Radon Jaidan Prevette, MD   guaiFENesin 600 MG 12 hr tablet Commonly known as: Mucinex Stopped by: Elige Radon Montanna Mcbain, MD   loratadine 10 MG tablet Commonly known as: CLARITIN Stopped by: Nils Pyle, MD       TAKE these medications    atenolol 25 MG tablet Commonly known as: TENORMIN Take 1/2 (one-half) tablet by mouth once daily   Calcium-D 600-400 MG-UNIT Tabs Take 1 tablet by mouth daily.   Fish Oil 1200 MG Caps Take 1,200 mg by mouth daily.   Garlic 1000 MG Caps Take 1 capsule by mouth daily.   HYDROcodone-acetaminophen 10-325 MG tablet Commonly known as: NORCO Take 1 tablet by mouth 3 (three) times daily  as needed.   hydrOXYzine 25 MG capsule Commonly known as: VISTARIL Take 1 capsule (25 mg total) by mouth at bedtime as needed.   losartan 25 MG tablet Commonly known as: COZAAR Take 1 tablet (25 mg total) by mouth daily.   magnesium oxide 400 MG tablet Commonly known as: MAG-OX Take 400 mg by mouth 2 (two) times daily.   multivitamin with minerals Tabs tablet Take 1 tablet by mouth daily.   omeprazole 40 MG capsule Commonly known as: PRILOSEC Take 1 capsule (40 mg total) by mouth daily.   polyethylene glycol 17 g  packet Commonly known as: MIRALAX / GLYCOLAX Take 17 g by mouth daily.   pravastatin 40 MG tablet Commonly known as: PRAVACHOL Take 1 tablet (40 mg total) by mouth daily.   Premarin vaginal cream Generic drug: conjugated estrogens INSERT 1/2 (ONE-HALF) GRAM VAGINALLY TWICE A WEEK AT BEDTIME   psyllium 58.6 % powder Commonly known as: METAMUCIL Take 1 packet by mouth 2 (two) times daily.   venlafaxine XR 75 MG 24 hr capsule Commonly known as: EFFEXOR-XR TAKE 1 CAPSULE BY MOUTH ONCE DAILY WITH BREAKFAST         Objective:   BP 135/73   Pulse 78   Ht 5\' 3"  (1.6 m)   Wt 149 lb (67.6 kg)   LMP 05/14/2001 (Approximate)   SpO2 95%   BMI 26.39 kg/m   Wt Readings from Last 3 Encounters:  10/19/22 149 lb (67.6 kg)  04/19/22 149 lb (67.6 kg)  01/26/22 147 lb (66.7 kg)    Physical Exam Vitals and nursing note reviewed.  Constitutional:      General: She is not in acute distress.    Appearance: She is well-developed. She is not diaphoretic.  Eyes:     Conjunctiva/sclera: Conjunctivae normal.  Cardiovascular:     Rate and Rhythm: Normal rate and regular rhythm.     Heart sounds: Normal heart sounds. No murmur heard. Pulmonary:     Effort: Pulmonary effort is normal. No respiratory distress.     Breath sounds: Normal breath sounds. No wheezing.  Musculoskeletal:        General: No swelling or tenderness. Normal range of motion.  Skin:    General: Skin is warm and dry.     Findings: No rash.  Neurological:     Mental Status: She is alert and oriented to person, place, and time.     Coordination: Coordination normal.  Psychiatric:        Behavior: Behavior normal.       Assessment & Plan:   Problem List Items Addressed This Visit       Cardiovascular and Mediastinum   White coat syndrome with diagnosis of hypertension   Essential hypertension   Relevant Orders   CBC with Differential/Platelet   CMP14+EGFR     Endocrine   Hypothyroid   Relevant Orders    TSH     Other   HLD (hyperlipidemia) - Primary   Relevant Orders   Lipid panel   Anxiety   Relevant Medications   venlafaxine XR (EFFEXOR-XR) 75 MG 24 hr capsule   hydrOXYzine (VISTARIL) 25 MG capsule   Other Relevant Orders   CBC with Differential/Platelet    Seems to be doing well, continue current medicine, no changes.  Will check blood work today. Follow up plan: Return in about 6 months (around 04/20/2023), or if symptoms worsen or fail to improve, for Thyroid and hypertension recheck.  Counseling provided for all of  the vaccine components Orders Placed This Encounter  Procedures   CBC with Differential/Platelet   CMP14+EGFR   Lipid panel   TSH    Arville Care, MD [facility] 10/19/2022, 9:40 AM

## 2022-10-20 LAB — CBC WITH DIFFERENTIAL/PLATELET
Basophils Absolute: 0.1 10*3/uL (ref 0.0–0.2)
Basos: 1 %
EOS (ABSOLUTE): 0.3 10*3/uL (ref 0.0–0.4)
Eos: 4 %
Hematocrit: 37.2 % (ref 34.0–46.6)
Hemoglobin: 12.3 g/dL (ref 11.1–15.9)
Immature Grans (Abs): 0 10*3/uL (ref 0.0–0.1)
Immature Granulocytes: 0 %
Lymphocytes Absolute: 1.9 10*3/uL (ref 0.7–3.1)
Lymphs: 29 %
MCH: 31.1 pg (ref 26.6–33.0)
MCHC: 33.1 g/dL (ref 31.5–35.7)
MCV: 94 fL (ref 79–97)
Monocytes Absolute: 0.5 10*3/uL (ref 0.1–0.9)
Monocytes: 8 %
Neutrophils Absolute: 3.8 10*3/uL (ref 1.4–7.0)
Neutrophils: 58 %
Platelets: 283 10*3/uL (ref 150–450)
RBC: 3.96 x10E6/uL (ref 3.77–5.28)
RDW: 12.9 % (ref 11.7–15.4)
WBC: 6.6 10*3/uL (ref 3.4–10.8)

## 2022-10-20 LAB — CMP14+EGFR
ALT: 19 IU/L (ref 0–32)
AST: 33 IU/L (ref 0–40)
Albumin/Globulin Ratio: 2 (ref 1.2–2.2)
Albumin: 4.2 g/dL (ref 3.6–4.6)
Alkaline Phosphatase: 78 IU/L (ref 44–121)
BUN/Creatinine Ratio: 16 (ref 12–28)
BUN: 17 mg/dL (ref 10–36)
Bilirubin Total: 0.3 mg/dL (ref 0.0–1.2)
CO2: 25 mmol/L (ref 20–29)
Calcium: 9.4 mg/dL (ref 8.7–10.3)
Chloride: 103 mmol/L (ref 96–106)
Creatinine, Ser: 1.09 mg/dL — ABNORMAL HIGH (ref 0.57–1.00)
Globulin, Total: 2.1 g/dL (ref 1.5–4.5)
Glucose: 104 mg/dL — ABNORMAL HIGH (ref 70–99)
Potassium: 4.7 mmol/L (ref 3.5–5.2)
Sodium: 142 mmol/L (ref 134–144)
Total Protein: 6.3 g/dL (ref 6.0–8.5)
eGFR: 48 mL/min/{1.73_m2} — ABNORMAL LOW (ref >59)

## 2022-10-20 LAB — LIPID PANEL
Chol/HDL Ratio: 2.5 ratio (ref 0.0–4.4)
Cholesterol, Total: 198 mg/dL (ref 100–199)
HDL: 80 mg/dL (ref >39)
LDL Chol Calc (NIH): 102 mg/dL — ABNORMAL HIGH (ref 0–99)
Triglycerides: 91 mg/dL (ref 0–149)
VLDL Cholesterol Cal: 16 mg/dL (ref 5–40)

## 2022-10-25 DIAGNOSIS — M170 Bilateral primary osteoarthritis of knee: Secondary | ICD-10-CM | POA: Diagnosis not present

## 2022-11-01 DIAGNOSIS — M170 Bilateral primary osteoarthritis of knee: Secondary | ICD-10-CM | POA: Diagnosis not present

## 2022-12-05 ENCOUNTER — Ambulatory Visit
Admission: RE | Admit: 2022-12-05 | Discharge: 2022-12-05 | Disposition: A | Discharge status: Home / Self Care | Payer: Medicare Other | Source: Ambulatory Visit | Attending: Family Medicine | Admitting: Family Medicine

## 2022-12-05 DIAGNOSIS — Z1231 Encounter for screening mammogram for malignant neoplasm of breast: Secondary | ICD-10-CM

## 2022-12-17 DIAGNOSIS — M25561 Pain in right knee: Secondary | ICD-10-CM | POA: Diagnosis not present

## 2022-12-17 DIAGNOSIS — M25562 Pain in left knee: Secondary | ICD-10-CM | POA: Diagnosis not present

## 2023-01-22 DIAGNOSIS — M5416 Radiculopathy, lumbar region: Secondary | ICD-10-CM | POA: Diagnosis not present

## 2023-01-22 DIAGNOSIS — M25561 Pain in right knee: Secondary | ICD-10-CM | POA: Diagnosis not present

## 2023-01-22 DIAGNOSIS — M25562 Pain in left knee: Secondary | ICD-10-CM | POA: Diagnosis not present

## 2023-02-20 ENCOUNTER — Ambulatory Visit (INDEPENDENT_AMBULATORY_CARE_PROVIDER_SITE_OTHER): Payer: Medicare Other

## 2023-02-20 DIAGNOSIS — Z23 Encounter for immunization: Secondary | ICD-10-CM | POA: Diagnosis not present

## 2023-03-01 ENCOUNTER — Ambulatory Visit: Payer: Medicare Other

## 2023-03-01 VITALS — Ht 64.0 in | Wt 152.0 lb

## 2023-03-01 DIAGNOSIS — Z0000 Encounter for general adult medical examination without abnormal findings: Secondary | ICD-10-CM

## 2023-03-01 NOTE — Patient Instructions (Signed)
Ms. Meckstroth , Thank you for taking time to come for your Medicare Wellness Visit. I appreciate your ongoing commitment to your health goals. Please review the following plan we discussed and let me know if I can assist you in the future.   Referrals/Orders/Follow-Ups/Clinician Recommendations: Aim for 30 minutes of exercise or brisk walking, 6-8 glasses of water, and 5 servings of fruits and vegetables each day.   This is a list of the screening recommended for you and due dates:  Health Maintenance  Topic Date Due   COVID-19 Vaccine (7 - 2023-24 season) 01/13/2023   DEXA scan (bone density measurement)  05/20/2023   Mammogram  12/05/2023   Medicare Annual Wellness Visit  02/29/2024   DTaP/Tdap/Td vaccine (3 - Td or Tdap) 02/19/2033   Pneumonia Vaccine  Completed   Flu Shot  Completed   Zoster (Shingles) Vaccine  Completed   HPV Vaccine  Aged Out    Advanced directives: (Copy Requested) Please bring a copy of your health care power of attorney and living will to the office to be added to your chart at your convenience.  Next Medicare Annual Wellness Visit scheduled for next year: Yes  Insert Preventive Care attachment Insert FALL PREVENTION attachment if needed

## 2023-03-01 NOTE — Progress Notes (Signed)
Subjective:   Shelly Nelson is a 89 y.o. female who presents for Medicare Annual (Subsequent) preventive examination.  Visit Complete: Virtual I connected with  Nicia Lavigna on 03/01/23 by a audio enabled telemedicine application and verified that I am speaking with the correct person using two identifiers.  Patient Location: Home  Provider Location: Home Office  I discussed the limitations of evaluation and management by telemedicine. The patient expressed understanding and agreed to proceed.  Vital Signs: Because this visit was a virtual/telehealth visit, some criteria may be missing or patient reported. Any vitals not documented were not able to be obtained and vitals that have been documented are patient reported.  Patient Medicare AWV questionnaire was completed by the patient on 03/01/2023; I have confirmed that all information answered by patient is correct and no changes since this date.  Cardiac Risk Factors include: advanced age (>9men, >5 women);dyslipidemia;hypertension     Objective:    Today's Vitals   03/01/23 1430  Weight: 152 lb (68.9 kg)  Height: 5\' 4"  (1.626 m)   Body mass index is 26.09 kg/m.     03/01/2023    2:33 PM 01/26/2022    2:36 PM 01/25/2021    3:47 PM 12/09/2019    2:25 PM 10/07/2018    2:31 PM 06/09/2012   10:11 AM 06/04/2012   11:01 AM  Advanced Directives  Does Patient Have a Medical Advance Directive? Yes No Yes Yes No Patient does not have advance directive Patient does not have advance directive;Patient would not like information  Type of Public librarian Power of [place];Living will  Healthcare Power of [employer] Power of Attorney     Does patient want to make changes to medical advance directive?    No - Patient declined     Copy of Healthcare Power of Attorney in Chart? No - copy requested  No - copy requested No - copy requested     Would patient like information on creating a medical advance  directive?  No - Patient declined  No - Patient declined No - Patient declined    Pre-existing out of facility DNR order (yellow form or pink MOST form)       No    Current Medications (verified) Outpatient Encounter Medications as of 03/01/2023  Medication Sig   atenolol (TENORMIN) 25 MG tablet Take 1/2 (one-half) tablet by mouth once daily   Calcium Carbonate-Vitamin D (CALCIUM-D) 600-400 MG-UNIT TABS Take 1 tablet by mouth daily.   conjugated estrogens (PREMARIN) vaginal cream INSERT 1/2 (ONE-HALF) GRAM VAGINALLY TWICE A WEEK AT BEDTIME   Garlic 1000 MG CAPS Take 1 capsule by mouth daily.   HYDROcodone-acetaminophen (NORCO) 10-325 MG tablet Take 1 tablet by mouth 3 (three) times daily as needed.   hydrOXYzine (VISTARIL) 25 MG capsule Take 1 capsule (25 mg total) by mouth at bedtime as needed.   losartan (COZAAR) 25 MG tablet Take 1 tablet (25 mg total) by mouth daily.   magnesium oxide (MAG-OX) 400 MG tablet Take 400 mg by mouth 2 (two) times daily.   Multiple Vitamin (MULTIVITAMIN WITH MINERALS) TABS Take 1 tablet by mouth daily.   Omega-3 Fatty Acids (FISH OIL) 1200 MG CAPS Take 1,200 mg by mouth daily.   omeprazole (PRILOSEC) 40 MG capsule Take 1 capsule (40 mg total) by mouth daily.   polyethylene glycol (MIRALAX / GLYCOLAX) 17 g packet Take 17 g by mouth daily.   pravastatin (PRAVACHOL) 40 MG tablet Take 1 tablet (40 mg  total) by mouth daily.   psyllium (METAMUCIL) 58.6 % powder Take 1 packet by mouth 2 (two) times daily.   venlafaxine XR (EFFEXOR-XR) 75 MG 24 hr capsule TAKE 1 CAPSULE BY MOUTH ONCE DAILY WITH BREAKFAST   No facility-administered encounter medications on file as of 03/01/2023.    Allergies (verified) Cephalexin   History: Past Medical History:  Diagnosis Date   Allergy    Anxiety    Arthritis    Basal cell carcinoma    GERD (gastroesophageal reflux disease) 2012   Hypercholesteremia 2000   Hypertension 2000   Spinal stenosis, lumbar region, without  neurogenic claudication 2012   Squamous cell carcinoma    Thyroid disease 2013   thyroid nodule with needle aspiration   Past Surgical History:  Procedure Laterality Date   BACK SURGERY  11/08/2010   lumbar-bulging disc   CATARACT EXTRACTION     left eye-2011-Dr Hunt   CATARACT EXTRACTION W/PHACO  06/09/2012   Procedure: CATARACT EXTRACTION PHACO AND INTRAOCULAR LENS PLACEMENT (IOC);  Surgeon: Gemma Payor, MD;  Location: [facility];  Service: Ophthalmology;  Laterality: Left;  CDE=13.91   skin cancer removal N/A 09/10/13   THYROID LOBECTOMY  age 89   TONSILLECTOMY     age 74   TUBAL LIGATION     Family History  Problem Relation Age of Onset   Hypertension Mother    Anxiety disorder Mother    Breast cancer Mother 73       metasis to lung and bones   Hypertension Father    COPD Father    Hypertension Maternal Grandmother    Diabetes Maternal Grandmother        type 2   Hypertension Paternal Grandmother    Heart disease Paternal Grandmother    Stroke Maternal Grandfather    COPD Paternal Grandfather    Breast cancer Maternal Aunt    Cancer Maternal Uncle        X 5 with various cancers   Social History   Socioeconomic History   Marital status: Widowed    Spouse name: Chrissie Noa   Number of children: 1   Years of education: Not on file   Highest education level: High school graduate  Occupational History    Employer: RETIRED  Tobacco Use   Smoking status: Never   Smokeless tobacco: Never  Vaping Use   Vaping status: Never Used  Substance and Sexual Activity   Alcohol use: No   Drug use: No   Sexual activity: Not Currently    Birth control/protection: Post-menopausal, Surgical    Comment: BTL  Other Topics Concern   Not on file  Social History Narrative   Lives in one level home alone   Daughter and grandchildren all live very close   Social Determinants of Health   Financial Resource Strain: Low Risk  (03/01/2023)   Overall Financial Resource Strain (CARDIA)     Difficulty of Paying Living Expenses: Not hard at all  Food Insecurity: No Food Insecurity (03/01/2023)   Hunger Vital Sign    Worried About Running Out of Food in the Last Year: Never true    Ran Out of Food in the Last Year: Never true  Transportation Needs: No Transportation Needs (03/01/2023)   PRAPARE - Administrator, Civil Service (Medical): No    Lack of Transportation (Non-Medical): No  Physical Activity: Inactive (03/01/2023)   Exercise Vital Sign    Days of Exercise per Week: 0 days    Minutes of  Exercise per Session: 0 min  Stress: No Stress Concern Present (03/01/2023)   [employer] of Occupational Health - Occupational Stress Questionnaire    Feeling of Stress : Not at all  Social Connections: Moderately Isolated (03/01/2023)   Social Connection and Isolation Panel [NHANES]    Frequency of Communication with Friends and Family: More than three times a week    Frequency of Social Gatherings with Friends and Family: More than three times a week    Attends Religious Services: More than 4 times per year    Active Member of [employer] or Organizations: No    Attends Banker Meetings: Never    Marital Status: Widowed    Tobacco Counseling Counseling given: Not Answered   Clinical Intake:  Pre-visit preparation completed: Yes  Pain : No/denies pain     Nutritional Risks: None Diabetes: No  How often do you need to have someone help you when you read instructions, pamphlets, or other written materials from your doctor or pharmacy?: 1 - Never  Interpreter Needed?: No  Information entered by :: Renie Ora, LPN   Activities of Daily Living    03/01/2023    2:33 PM  In your present state of health, do you have any difficulty performing the following activities:  Hearing? 0  Vision? 0  Difficulty concentrating or making decisions? 0  Walking or climbing stairs? 0  Dressing or bathing? 0  Doing errands, shopping? 0  Preparing Food  and eating ? N  Using the Toilet? N  In the past six months, have you accidently leaked urine? N  Do you have problems with loss of bowel control? N  Managing your Medications? N  Managing your Finances? N  Housekeeping or managing your Housekeeping? N    Patient Care Team: Dettinger, Elige Radon, MD as PCP - General (Family Medicine) Alfredo Martinez, MD as Attending Physician (Urology) Dorena Cookey, MD (Inactive) as Attending Physician (Gastroenterology) Romine, Edwena Felty, MD as Attending Physician (Obstetrics and Gynecology)  Indicate any recent Medical Services you may have received from other than [facility] providers in the past year (date may be approximate).     Assessment:   This is a routine wellness examination for Lynze.  Hearing/Vision screen Vision Screening - Comments:: Wears rx glasses - up to date with routine eye exams with  Dr.Johnson    Goals Addressed             This Visit's Progress    DIET - INCREASE WATER INTAKE         Depression Screen    03/01/2023    2:32 PM 10/19/2022    9:30 AM 04/19/2022    9:28 AM 01/26/2022    2:34 PM 10/18/2021    9:24 AM 04/19/2021    8:51 AM 01/25/2021    3:41 PM  PHQ 2/9 Scores  PHQ - 2 Score 0 0 0 0 0 0 0  PHQ- 9 Score     7      Fall Risk    03/01/2023    2:31 PM 10/19/2022    9:30 AM 04/19/2022    9:31 AM 04/19/2022    9:27 AM 01/26/2022    2:31 PM  Fall Risk   Falls in the past year? 0 0 0 0 0  Number falls in past yr: 0    0  Injury with Fall? 0    0  Risk for fall due to : No Fall Risks    Orthopedic patient;Impaired  balance/gait  Follow up Falls prevention discussed    Falls prevention discussed;Education provided    MEDICARE RISK AT HOME: Medicare Risk at Home Any stairs in or around the home?: No If so, are there any without handrails?: No Home free of loose throw rugs in walkways, pet beds, electrical cords, etc?: Yes Adequate lighting in your home to reduce risk of falls?: Yes Life alert?: No Use of  a cane, walker or w/c?: No Grab bars in the bathroom?: Yes Shower chair or bench in shower?: Yes Elevated toilet seat or a handicapped toilet?: Yes  TIMED UP AND GO:  Was the test performed?  No    Cognitive Function:        03/01/2023    2:33 PM 01/26/2022    2:37 PM 12/09/2019    2:27 PM 10/07/2018    2:34 PM  6CIT Screen  What Year? 0 points 0 points 0 points 0 points  What month? 0 points 0 points 0 points 0 points  What time? 0 points 0 points 0 points 0 points  Count back from 20 0 points 0 points 0 points 0 points  Months in reverse 0 points 0 points 0 points 0 points  Repeat phrase 0 points 2 points 0 points 0 points  Total Score 0 points 2 points 0 points 0 points    Immunizations Immunization History  Administered Date(s) Administered   Fluad Quad(high Dose 65+) 02/27/2019, 02/19/2020, 02/21/2021, 03/12/2022   Fluad Trivalent(High Dose 65+) 02/20/2023   Influenza, High Dose Seasonal PF 02/08/2017, 02/20/2018   Influenza,inj,Quad PF,6+ Mos 02/10/2013, 02/18/2014, 02/16/2015, 02/02/2016   Moderna Covid-19 Vaccine Bivalent Booster 5yrs & up 03/22/2021   Moderna Sars-Covid-2 Vaccination 06/01/2019, 06/29/2019, 03/23/2020, 09/14/2020   Pneumococcal Conjugate-13 02/18/2014   Pneumococcal Polysaccharide-23 05/14/2005   Pneumococcal-Unspecified 01/27/2021   Tdap 09/10/2012, 02/20/2023   Unspecified SARS-COV-2 Vaccination 01/18/2021   Zoster Recombinant(Shingrix) 10/19/2020, 10/18/2021   Zoster, Live 02/12/2008    TDAP status: Up to date  Flu Vaccine status: Due, Education has been provided regarding the importance of this vaccine. Advised may receive this vaccine at local pharmacy or Health Dept. Aware to provide a copy of the vaccination record if obtained from local pharmacy or Health Dept. Verbalized acceptance and understanding.  Pneumococcal vaccine status: Up to date  Covid-19 vaccine status: Completed vaccines  Qualifies for Shingles Vaccine? Yes    Zostavax completed Yes   Shingrix Completed?: Yes  Screening Tests Health Maintenance  Topic Date Due   COVID-19 Vaccine (7 - 2023-24 season) 01/13/2023   DEXA SCAN  05/20/2023   MAMMOGRAM  12/05/2023   Medicare Annual Wellness (AWV)  02/29/2024   DTaP/Tdap/Td (3 - Td or Tdap) 02/19/2033   Pneumonia Vaccine 70+ Years old  Completed   INFLUENZA VACCINE  Completed   Zoster Vaccines- Shingrix  Completed   HPV VACCINES  Aged Out    Health Maintenance  Health Maintenance Due  Topic Date Due   COVID-19 Vaccine (7 - 2023-24 season) 01/13/2023    Colorectal cancer screening: No longer required.   Mammogram status: No longer required due to age .  Bone Density status: Completed 05/19/2021. Results reflect: Bone density results: OSTEOPOROSIS. Repeat every 2 years.  Lung Cancer Screening: (Low Dose CT Chest recommended if Age 16-80 years, 20 pack-year currently smoking OR have quit w/in 15years.) does not qualify.   Lung Cancer Screening Referral: n/a  Additional Screening:  Hepatitis C Screening: does not qualify;  Vision Screening: Recommended annual ophthalmology exams for early  detection of glaucoma and other disorders of the eye. Is the patient up to date with their annual eye exam?  Yes  Who is the provider or what is the name of the office in which the patient attends annual eye exams? Dr Laural Benes  If pt is not established with a provider, would they like to be referred to a provider to establish care? No .   Dental Screening: Recommended annual dental exams for proper oral hygiene   Community Resource Referral / Chronic Care Management: CRR required this visit?  No   CCM required this visit?  No     Plan:     I have personally reviewed and noted the following in the patient's chart:   Medical and social history Use of alcohol, tobacco or illicit drugs  Current medications and supplements including opioid prescriptions. Patient is not currently taking opioid  prescriptions. Functional ability and status Nutritional status Physical activity Advanced directives List of other physicians Hospitalizations, surgeries, and ER visits in previous 12 months Vitals Screenings to include cognitive, depression, and falls Referrals and appointments  In addition, I have reviewed and discussed with patient certain preventive protocols, quality metrics, and best practice recommendations. A written personalized care plan for preventive services as well as general preventive health recommendations were provided to patient.     Lorrene Reid, LPN   [id number]/02/[id number]   After Visit Summary: (MyChart) Due to this being a telephonic visit, the after visit summary with patients personalized plan was offered to patient via MyChart   Nurse Notes: none

## 2023-03-11 DIAGNOSIS — L570 Actinic keratosis: Secondary | ICD-10-CM | POA: Diagnosis not present

## 2023-03-11 DIAGNOSIS — X32XXXA Exposure to sunlight, initial encounter: Secondary | ICD-10-CM | POA: Diagnosis not present

## 2023-03-11 DIAGNOSIS — L821 Other seborrheic keratosis: Secondary | ICD-10-CM | POA: Diagnosis not present

## 2023-04-16 ENCOUNTER — Other Ambulatory Visit: Payer: Self-pay | Admitting: Family Medicine

## 2023-04-22 ENCOUNTER — Ambulatory Visit (INDEPENDENT_AMBULATORY_CARE_PROVIDER_SITE_OTHER): Payer: Medicare Other | Admitting: Family Medicine

## 2023-04-22 ENCOUNTER — Encounter: Payer: Self-pay | Admitting: Family Medicine

## 2023-04-22 VITALS — BP 173/74 | HR 73 | Ht 64.0 in | Wt 154.0 lb

## 2023-04-22 DIAGNOSIS — I10 Essential (primary) hypertension: Secondary | ICD-10-CM | POA: Diagnosis not present

## 2023-04-22 DIAGNOSIS — E039 Hypothyroidism, unspecified: Secondary | ICD-10-CM

## 2023-04-22 DIAGNOSIS — F419 Anxiety disorder, unspecified: Secondary | ICD-10-CM

## 2023-04-22 DIAGNOSIS — E7800 Pure hypercholesterolemia, unspecified: Secondary | ICD-10-CM | POA: Diagnosis not present

## 2023-04-22 DIAGNOSIS — K219 Gastro-esophageal reflux disease without esophagitis: Secondary | ICD-10-CM

## 2023-04-22 MED ORDER — PRAVASTATIN SODIUM 40 MG PO TABS: 40.0000 mg | ORAL_TABLET | Freq: Every day | ORAL | 3 refills | Status: DC

## 2023-04-22 MED ORDER — ATENOLOL 25 MG PO TABS: ORAL_TABLET | ORAL | 3 refills | Status: DC

## 2023-04-22 MED ORDER — OMEPRAZOLE 40 MG PO CPDR: 40.0000 mg | DELAYED_RELEASE_CAPSULE | Freq: Every day | ORAL | 3 refills | Status: DC

## 2023-04-22 MED ORDER — LOSARTAN POTASSIUM 25 MG PO TABS: 25.0000 mg | ORAL_TABLET | Freq: Every day | ORAL | 3 refills | Status: DC

## 2023-04-22 NOTE — Progress Notes (Signed)
BP (!) 173/74   Pulse 73   Ht 5\' 4"  (1.626 m)   Wt 154 lb (69.9 kg)   LMP 05/14/2001 (Approximate)   SpO2 97%   BMI 26.43 kg/m    Subjective:   Patient ID: Shelly Nelson, female    DOB: 1929/05/23, 89 y.o.   MRN: [id number]  HPI: Shelly Nelson is a 89 y.o. female presenting on 04/22/2023 for Medical Management of Chronic Issues, Hypothyroidism, Hyperlipidemia, and Hypertension  Hyperlipidemia Patient is currently taking pravastatin and fish oil. She denies myalgias or weakness. She does not have a history of liver damage from it.   Hypertension Patient is currently taking atenolol and losartan. Her blood pressure today is 173/74. Her blood pressure at home over the past week has ranged between 132-172 systolic and 54-74 diastolic. She denies lightheadedness or dizziness, headaches, chest pain, or shortness of breath. She does have occasional floaters.  Subclinical hypothyroidism recheck Patient is not currently taking any medications. She denies any changes in hair or skin, heat or cold intolerance, diarrhea or constipation, and chest pain or palpitations.   Anxiety Patient is currently taking venlafaxine daily and hydroxyzine as needed. She feels her anxiety is well-controlled with this regimen. She occasionally uses the hydroxyzine if she is having difficulties falling asleep at night.  Patient reports left sciatica pain that has recurred over the past two weeks. She takes one hydrocodone every afternoon to help control the pain while she cooks food and does laundry. She will also take ibuprofen 400 mg every now and then, and she feels like the ibuprofen helps more than the oxycodone. She has not fallen from pain or weakness.  Relevant past medical, surgical, family and social history reviewed and updated as indicated. Interim medical history since our last visit reviewed. Allergies and medications reviewed and updated.  Review of Systems  Constitutional:   Negative for chills and fever.  HENT:  Negative for congestion, sinus pain and sore throat.   Eyes:  Negative for visual disturbance.  Respiratory:  Negative for cough, chest tightness and shortness of breath.   Cardiovascular:  Negative for chest pain, palpitations and leg swelling.  Gastrointestinal:  Positive for anal bleeding. Negative for abdominal pain, constipation and diarrhea.       Hemorrhoids bleed with defecation, normally a small amount is noticeable on the tissue after wiping  Endocrine: Negative for cold intolerance and heat intolerance.  Genitourinary:  Positive for enuresis. Negative for difficulty urinating, frequency and hematuria.  Musculoskeletal:  Positive for arthralgias and back pain. Negative for joint swelling and myalgias.  Neurological:  Positive for tremors. Negative for dizziness, syncope, weakness, light-headedness, numbness and headaches.  Psychiatric/Behavioral:  Positive for sleep disturbance. Negative for dysphoric mood. The patient is not nervous/anxious.     Per HPI unless specifically indicated above   Allergies as of 04/22/2023       Reactions   Cephalexin Hives        Medication List        Accurate as of April 22, 2023 11:21 AM. If you have any questions, ask your nurse or doctor.          atenolol 25 MG tablet Commonly known as: TENORMIN Take 1/2 (one-half) tablet by mouth once daily   Calcium-D 600-400 MG-UNIT Tabs Take 1 tablet by mouth daily.   Fish Oil 1200 MG Caps Take 1,200 mg by mouth daily.   Garlic 1000 MG Caps Take 1 capsule by mouth daily.   HYDROcodone-acetaminophen  10-325 MG tablet Commonly known as: NORCO Take 1 tablet by mouth 3 (three) times daily as needed.   hydrOXYzine 25 MG capsule Commonly known as: VISTARIL Take 1 capsule (25 mg total) by mouth at bedtime as needed.   losartan 25 MG tablet Commonly known as: COZAAR Take 1 tablet (25 mg total) by mouth daily.   magnesium oxide 400 MG  tablet Commonly known as: MAG-OX Take 400 mg by mouth 2 (two) times daily.   multivitamin with minerals Tabs tablet Take 1 tablet by mouth daily.   omeprazole 40 MG capsule Commonly known as: PRILOSEC Take 1 capsule (40 mg total) by mouth daily.   polyethylene glycol 17 g packet Commonly known as: MIRALAX / GLYCOLAX Take 17 g by mouth daily.   pravastatin 40 MG tablet Commonly known as: PRAVACHOL Take 1 tablet (40 mg total) by mouth daily.   Premarin vaginal cream Generic drug: conjugated estrogens INSERT 1/2 (ONE-HALF) GRAM VAGINALLY TWICE A WEEK AT BEDTIME   psyllium 58.6 % powder Commonly known as: METAMUCIL Take 1 packet by mouth 2 (two) times daily.   venlafaxine XR 75 MG 24 hr capsule Commonly known as: EFFEXOR-XR TAKE 1 CAPSULE BY MOUTH ONCE DAILY WITH BREAKFAST         Objective:   BP (!) 173/74   Pulse 73   Ht 5\' 4"  (1.626 m)   Wt 154 lb (69.9 kg)   LMP 05/14/2001 (Approximate)   SpO2 97%   BMI 26.43 kg/m   Wt Readings from Last 3 Encounters:  04/22/23 154 lb (69.9 kg)  03/01/23 152 lb (68.9 kg)  10/19/22 149 lb (67.6 kg)    Physical Exam Vitals and nursing note reviewed.  Constitutional:      General: She is not in acute distress.    Appearance: Normal appearance.  HENT:     Head: Normocephalic and atraumatic.     Right Ear: External ear normal.     Left Ear: External ear normal.  Eyes:     Conjunctiva/sclera: Conjunctivae normal.  Cardiovascular:     Rate and Rhythm: Normal rate and regular rhythm.     Heart sounds: Normal heart sounds.  Pulmonary:     Effort: Pulmonary effort is normal.     Breath sounds: Normal breath sounds. No wheezing or rales.  Abdominal:     General: Abdomen is flat. There is no distension.     Palpations: Abdomen is soft.     Tenderness: There is no abdominal tenderness.  Musculoskeletal:     Cervical back: Normal range of motion and neck supple. No tenderness.     Right lower leg: No edema.     Left lower  leg: No edema.  Skin:    General: Skin is warm and dry.  Neurological:     Mental Status: She is alert and oriented to person, place, and time.  Psychiatric:        Mood and Affect: Mood normal.        Behavior: Behavior normal.        Thought Content: Thought content normal.        Judgment: Judgment normal.    Assessment & Plan:   Problem List Items Addressed This Visit       Cardiovascular and Mediastinum   White coat syndrome with diagnosis of hypertension   Relevant Medications   losartan (COZAAR) 25 MG tablet   atenolol (TENORMIN) 25 MG tablet   pravastatin (PRAVACHOL) 40 MG tablet   Essential hypertension  Relevant Medications   losartan (COZAAR) 25 MG tablet   atenolol (TENORMIN) 25 MG tablet   pravastatin (PRAVACHOL) 40 MG tablet   Other Relevant Orders   CBC with Differential/Platelet   CMP14+EGFR   Lipid panel     Digestive   GERD (gastroesophageal reflux disease)   Relevant Medications   omeprazole (PRILOSEC) 40 MG capsule     Endocrine   Hypothyroid   Relevant Medications   atenolol (TENORMIN) 25 MG tablet     Other   HLD (hyperlipidemia) - Primary   Relevant Medications   losartan (COZAAR) 25 MG tablet   atenolol (TENORMIN) 25 MG tablet   pravastatin (PRAVACHOL) 40 MG tablet   Other Relevant Orders   CBC with Differential/Platelet   CMP14+EGFR   Lipid panel   Anxiety    Patient seems to be doing well. Blood pressure elevated at 173/74. Will not make any changes to antihypertensive regimen given better systolic pressures and low-normal diastolic pressures at home. Patient is tolerating statin well. Will recheck lipid panel today. Patient denies any symptoms of hypothyroidism. Will recheck TSH for subclinical hypothyroidism. Anxiety is well-controlled. Can consider increasing venlafaxine to 150 mg in future to help with nerve pain.   Follow up plan: Return in about 6 months (around 10/21/2023), or if symptoms worsen or fail to improve, for  Hypertension hyperlipidemia and thyroid recheck.  Counseling provided for all of the vaccine components Orders Placed This Encounter  Procedures   CBC with Differential/Platelet   CMP14+EGFR   Lipid panel    Gillermina Phy, Medical Student [facility] 04/22/2023, 11:21 AM  Patient seen and examined with Gillermina Phy, medical student.  Agree with assessment and plan above.  Patient declined hemorrhoid exam.  Anxiety and everything else seems to be under control except the blood pressure which comes up because of anxiety.  She says she is getting normal and low blood pressures at home and brings her chart with those. Arville Care, MD [facility] 04/26/2023, 12:45 PM

## 2023-04-23 LAB — CBC WITH DIFFERENTIAL/PLATELET
Basophils Absolute: 0.1 10*3/uL (ref 0.0–0.2)
Basos: 1 %
EOS (ABSOLUTE): 0.4 10*3/uL (ref 0.0–0.4)
Eos: 5 %
Hematocrit: 38.2 % (ref 34.0–46.6)
Hemoglobin: 12.6 g/dL (ref 11.1–15.9)
Immature Grans (Abs): 0 10*3/uL (ref 0.0–0.1)
Immature Granulocytes: 0 %
Lymphocytes Absolute: 2.2 10*3/uL (ref 0.7–3.1)
Lymphs: 26 %
MCH: 31.7 pg (ref 26.6–33.0)
MCHC: 33 g/dL (ref 31.5–35.7)
MCV: 96 fL (ref 79–97)
Monocytes Absolute: 0.6 10*3/uL (ref 0.1–0.9)
Monocytes: 8 %
Neutrophils Absolute: 5 10*3/uL (ref 1.4–7.0)
Neutrophils: 60 %
Platelets: 265 10*3/uL (ref 150–450)
RBC: 3.98 x10E6/uL (ref 3.77–5.28)
RDW: 12.5 % (ref 11.7–15.4)
WBC: 8.4 10*3/uL (ref 3.4–10.8)

## 2023-04-23 LAB — CMP14+EGFR
ALT: 21 [IU]/L (ref 0–32)
AST: 38 [IU]/L (ref 0–40)
Albumin: 4.4 g/dL (ref 3.6–4.6)
Alkaline Phosphatase: 77 [IU]/L (ref 44–121)
BUN/Creatinine Ratio: 20 (ref 12–28)
BUN: 23 mg/dL (ref 10–36)
Bilirubin Total: 0.3 mg/dL (ref 0.0–1.2)
CO2: 24 mmol/L (ref 20–29)
Calcium: 9.5 mg/dL (ref 8.7–10.3)
Chloride: 101 mmol/L (ref 96–106)
Creatinine, Ser: 1.15 mg/dL — ABNORMAL HIGH (ref 0.57–1.00)
Globulin, Total: 2.1 g/dL (ref 1.5–4.5)
Glucose: 101 mg/dL — ABNORMAL HIGH (ref 70–99)
Potassium: 5.2 mmol/L (ref 3.5–5.2)
Sodium: 141 mmol/L (ref 134–144)
Total Protein: 6.5 g/dL (ref 6.0–8.5)
eGFR: 45 mL/min/{1.73_m2} — ABNORMAL LOW (ref >59)

## 2023-04-23 LAB — LIPID PANEL
Chol/HDL Ratio: 2.4 {ratio} (ref 0.0–4.4)
Cholesterol, Total: 200 mg/dL — ABNORMAL HIGH (ref 100–199)
HDL: 84 mg/dL (ref >39)
LDL Chol Calc (NIH): 98 mg/dL (ref 0–99)
Triglycerides: 105 mg/dL (ref 0–149)
VLDL Cholesterol Cal: 18 mg/dL (ref 5–40)

## 2023-05-03 ENCOUNTER — Other Ambulatory Visit: Payer: Self-pay | Admitting: Family Medicine

## 2023-05-03 DIAGNOSIS — N952 Postmenopausal atrophic vaginitis: Secondary | ICD-10-CM

## 2023-10-01 DIAGNOSIS — M5416 Radiculopathy, lumbar region: Secondary | ICD-10-CM | POA: Diagnosis not present

## 2023-10-01 DIAGNOSIS — M25561 Pain in right knee: Secondary | ICD-10-CM | POA: Diagnosis not present

## 2023-10-01 DIAGNOSIS — M25562 Pain in left knee: Secondary | ICD-10-CM | POA: Diagnosis not present

## 2023-10-01 DIAGNOSIS — Z79899 Other long term (current) drug therapy: Secondary | ICD-10-CM | POA: Diagnosis not present

## 2023-10-21 ENCOUNTER — Encounter: Payer: Self-pay | Admitting: Family Medicine

## 2023-10-21 ENCOUNTER — Ambulatory Visit (INDEPENDENT_AMBULATORY_CARE_PROVIDER_SITE_OTHER): Payer: Medicare Other | Admitting: Family Medicine

## 2023-10-21 ENCOUNTER — Ambulatory Visit (INDEPENDENT_AMBULATORY_CARE_PROVIDER_SITE_OTHER)

## 2023-10-21 VITALS — BP 173/79 | HR 89 | Ht 64.0 in | Wt 157.0 lb

## 2023-10-21 DIAGNOSIS — K649 Unspecified hemorrhoids: Secondary | ICD-10-CM | POA: Diagnosis not present

## 2023-10-21 DIAGNOSIS — Z780 Asymptomatic menopausal state: Secondary | ICD-10-CM

## 2023-10-21 DIAGNOSIS — E039 Hypothyroidism, unspecified: Secondary | ICD-10-CM

## 2023-10-21 DIAGNOSIS — I10 Essential (primary) hypertension: Secondary | ICD-10-CM

## 2023-10-21 DIAGNOSIS — F419 Anxiety disorder, unspecified: Secondary | ICD-10-CM

## 2023-10-21 DIAGNOSIS — E7800 Pure hypercholesterolemia, unspecified: Secondary | ICD-10-CM | POA: Diagnosis not present

## 2023-10-21 MED ORDER — VENLAFAXINE HCL ER 75 MG PO CP24: ORAL_CAPSULE | ORAL | 1 refills | Status: DC

## 2023-10-21 MED ORDER — HYDROCORTISONE ACETATE 25 MG RE SUPP: 25.0000 mg | Freq: Two times a day (BID) | RECTAL | 1 refills | Status: AC

## 2023-10-21 NOTE — Progress Notes (Signed)
 BP (!) 173/79   Pulse 89   Ht 5\' 4"  (1.626 m)   Wt 157 lb (71.2 kg)   LMP 05/14/2001 (Approximate)   SpO2 95%   BMI 26.95 kg/m    Subjective:   Patient ID: Shelly Nelson, female    DOB: 1929/12/29, 89 y.o.   MRN: [id number]  HPI: Shelly Nelson is a 89 y.o. female presenting on 10/21/2023 for Medical Management of Chronic Issues, Hypothyroidism, and Hypertension   HPI Hypertension Patient is currently on losartan  and atenolol , and their blood pressure today is 173/79 but she usually runs high here.  At home she has been getting between the 120s and 150s consistently at home.. Patient denies any lightheadedness or dizziness. Patient denies headaches, blurred vision, chest pains, shortness of breath, or weakness. Denies any side effects from medication and is content with current medication.   Hyperlipidemia Patient is coming in for recheck of his hyperlipidemia. The patient is currently taking fish oils and pravastatin . They deny any issues with myalgias or history of liver damage from it. They deny any focal numbness or weakness or chest pain.   Hypothyroidism recheck Patient is coming in for thyroid  recheck today as well. They deny any issues with hair changes or heat or cold problems or diarrhea or constipation. They deny any chest pain or palpitations. They are currently on no medicine currently, has been subclinical and we are monitoring.  Patient has been that she has been get a little blood when she wipes and her hemorrhoids are flared up a little bit.  She is using the MiraLAX to make sure she gets regular bowel movements but she still has been getting a little bit more sore down there and some little spotty blood when she wipes or when she has a bowel movement.  Anxiety recheck Patient is coming in today for anxiety recheck and currently uses Effexor  and hydroxyzine  as needed.  She feels like she is doing well for the most part.  Still has some anxiety but for  the most part feels like it is helping her still.    10/21/2023   10:50 AM 10/21/2023   10:49 AM 04/22/2023   10:41 AM 04/22/2023   10:40 AM 03/01/2023    2:32 PM  Depression screen PHQ 2/9  Decreased Interest  0  0 0  Down, Depressed, Hopeless  0  0 0  PHQ - 2 Score  0  0 0  Altered sleeping 2  1    Tired, decreased energy 0  2    Change in appetite 3  2    Feeling bad or failure about yourself  0  0    Trouble concentrating 0  0    Moving slowly or fidgety/restless 3  2    Suicidal thoughts 0  0    Difficult doing work/chores Somewhat difficult  Somewhat difficult       Relevant past medical, surgical, family and social history reviewed and updated as indicated. Interim medical history since our last visit reviewed. Allergies and medications reviewed and updated.  Review of Systems  Constitutional:  Negative for chills and fever.  Eyes:  Negative for redness and visual disturbance.  Respiratory:  Negative for chest tightness and shortness of breath.   Cardiovascular:  Negative for chest pain and leg swelling.  Gastrointestinal:  Positive for anal bleeding. Negative for abdominal pain, constipation, diarrhea, nausea and vomiting.  Genitourinary:  Negative for difficulty urinating and dysuria.  Skin:  Negative  for rash.  Neurological:  Negative for dizziness, light-headedness and headaches.  Psychiatric/Behavioral:  Negative for agitation and behavioral problems.   All other systems reviewed and are negative.   Per HPI unless specifically indicated above   Allergies as of 10/21/2023       Reactions   Cephalexin Hives        Medication List        Accurate as of October 21, 2023 11:05 AM. If you have any questions, ask your nurse or doctor.          amoxicillin  500 MG capsule Commonly known as: AMOXIL  Take 500 mg by mouth every 8 (eight) hours.   atenolol  25 MG tablet Commonly known as: TENORMIN  Take 1/2 (one-half) tablet by mouth once daily   Calcium-D 600-400  MG-UNIT Tabs Take 1 tablet by mouth daily.   Fish Oil 1200 MG Caps Take 1,200 mg by mouth daily.   Garlic 1000 MG Caps Take 1 capsule by mouth daily.   HYDROcodone-acetaminophen 10-325 MG tablet Commonly known as: NORCO Take 1 tablet by mouth 3 (three) times daily as needed.   hydrocortisone 25 MG suppository Commonly known as: ANUSOL-HC Place 1 suppository (25 mg total) rectally 2 (two) times daily. Started by: Lucio Sabin Mayfield Schoene   hydrOXYzine  25 MG capsule Commonly known as: VISTARIL  Take 1 capsule (25 mg total) by mouth at bedtime as needed.   losartan  25 MG tablet Commonly known as: COZAAR  Take 1 tablet (25 mg total) by mouth daily.   magnesium oxide 400 MG tablet Commonly known as: MAG-OX Take 400 mg by mouth 2 (two) times daily.   multivitamin with minerals Tabs tablet Take 1 tablet by mouth daily.   omeprazole  40 MG capsule Commonly known as: PRILOSEC Take 1 capsule (40 mg total) by mouth daily.   polyethylene glycol 17 g packet Commonly known as: MIRALAX / GLYCOLAX Take 17 g by mouth daily.   pravastatin  40 MG tablet Commonly known as: PRAVACHOL  Take 1 tablet (40 mg total) by mouth daily.   Premarin  vaginal cream Generic drug: conjugated estrogens  INSERT 1/2 (ONE-HALF) GRAM VAGINALLY TWICE A WEEK AT BEDTIME   psyllium 58.6 % powder Commonly known as: METAMUCIL Take 1 packet by mouth 2 (two) times daily.   venlafaxine  XR 75 MG 24 hr capsule Commonly known as: EFFEXOR -XR TAKE 1 CAPSULE BY MOUTH ONCE DAILY WITH BREAKFAST         Objective:   BP (!) 173/79   Pulse 89   Ht 5\' 4"  (1.626 m)   Wt 157 lb (71.2 kg)   LMP 05/14/2001 (Approximate)   SpO2 95%   BMI 26.95 kg/m   Wt Readings from Last 3 Encounters:  10/21/23 157 lb (71.2 kg)  04/22/23 154 lb (69.9 kg)  03/01/23 152 lb (68.9 kg)    Physical Exam Vitals and nursing note reviewed.  Constitutional:      General: She is not in acute distress.    Appearance: She is well-developed.  She is not diaphoretic.  Eyes:     Conjunctiva/sclera: Conjunctivae normal.  Cardiovascular:     Rate and Rhythm: Normal rate and regular rhythm.     Heart sounds: Normal heart sounds. No murmur heard. Pulmonary:     Effort: Pulmonary effort is normal. No respiratory distress.     Breath sounds: Normal breath sounds. No wheezing.  Musculoskeletal:        General: No swelling.  Skin:    General: Skin is warm and dry.  Findings: No rash.  Neurological:     Mental Status: She is alert and oriented to person, place, and time.     Coordination: Coordination normal.  Psychiatric:        Behavior: Behavior normal.       Assessment & Plan:   Problem List Items Addressed This Visit       Cardiovascular and Mediastinum   White coat syndrome with diagnosis of hypertension   Relevant Medications   hydrocortisone (ANUSOL-HC) 25 MG suppository   Essential hypertension - Primary   Relevant Orders   CBC with Differential/Platelet   CMP14+EGFR   Lipid panel     Endocrine   Hypothyroid   Relevant Orders   TSH     Other   HLD (hyperlipidemia)   Relevant Orders   Lipid panel   Anxiety   Relevant Medications   venlafaxine  XR (EFFEXOR -XR) 75 MG 24 hr capsule   Postmenopausal   Relevant Orders   DG [facility] DEXA   Other Visit Diagnoses       Bleeding hemorrhoids       Relevant Orders   CBC with Differential/Platelet       Blood pressure up today, continue to monitor at home, usually runs better at home but if it starts trending up at home more then we may have to adjust things. Sent to hemorrhoid cream for her.  Will check her blood counts today. Follow up plan: Return in about 6 months (around 04/21/2024), or if symptoms worsen or fail to improve, for Hypertension and hyperlipidemia and hypothyroidism.  Counseling provided for all of the vaccine components Orders Placed This Encounter  Procedures   DG [facility] DEXA   CBC with Differential/Platelet   CMP14+EGFR   Lipid  panel   TSH    Jolyne Needs, MD [facility] 10/21/2023, 11:05 AM

## 2023-10-22 ENCOUNTER — Other Ambulatory Visit (HOSPITAL_COMMUNITY): Payer: Self-pay

## 2023-10-22 ENCOUNTER — Telehealth: Payer: Self-pay

## 2023-10-22 DIAGNOSIS — Z780 Asymptomatic menopausal state: Secondary | ICD-10-CM | POA: Diagnosis not present

## 2023-10-22 DIAGNOSIS — M8589 Other specified disorders of bone density and structure, multiple sites: Secondary | ICD-10-CM | POA: Diagnosis not present

## 2023-10-22 LAB — CBC WITH DIFFERENTIAL/PLATELET
Basophils Absolute: 0.1 10*3/uL (ref 0.0–0.2)
Basos: 1 %
EOS (ABSOLUTE): 0.4 10*3/uL (ref 0.0–0.4)
Eos: 6 %
Hematocrit: 38.4 % (ref 34.0–46.6)
Hemoglobin: 12 g/dL (ref 11.1–15.9)
Immature Grans (Abs): 0 10*3/uL (ref 0.0–0.1)
Immature Granulocytes: 0 %
Lymphocytes Absolute: 2.4 10*3/uL (ref 0.7–3.1)
Lymphs: 38 %
MCH: 30.5 pg (ref 26.6–33.0)
MCHC: 31.3 g/dL — ABNORMAL LOW (ref 31.5–35.7)
MCV: 98 fL — ABNORMAL HIGH (ref 79–97)
Monocytes Absolute: 0.5 10*3/uL (ref 0.1–0.9)
Monocytes: 7 %
Neutrophils Absolute: 2.9 10*3/uL (ref 1.4–7.0)
Neutrophils: 48 %
Platelets: 282 10*3/uL (ref 150–450)
RBC: 3.94 x10E6/uL (ref 3.77–5.28)
RDW: 12.5 % (ref 11.7–15.4)
WBC: 6.2 10*3/uL (ref 3.4–10.8)

## 2023-10-22 LAB — CMP14+EGFR
ALT: 17 IU/L (ref 0–32)
AST: 33 IU/L (ref 0–40)
Albumin: 4.3 g/dL (ref 3.6–4.6)
Alkaline Phosphatase: 77 IU/L (ref 44–121)
BUN/Creatinine Ratio: 17 (ref 12–28)
BUN: 22 mg/dL (ref 10–36)
Bilirubin Total: 0.2 mg/dL (ref 0.0–1.2)
CO2: 20 mmol/L (ref 20–29)
Calcium: 9.1 mg/dL (ref 8.7–10.3)
Chloride: 102 mmol/L (ref 96–106)
Creatinine, Ser: 1.26 mg/dL — ABNORMAL HIGH (ref 0.57–1.00)
Globulin, Total: 2.1 g/dL (ref 1.5–4.5)
Glucose: 99 mg/dL (ref 70–99)
Potassium: 4.8 mmol/L (ref 3.5–5.2)
Sodium: 140 mmol/L (ref 134–144)
Total Protein: 6.4 g/dL (ref 6.0–8.5)
eGFR: 40 mL/min/{1.73_m2} — ABNORMAL LOW (ref >59)

## 2023-10-22 LAB — LIPID PANEL
Chol/HDL Ratio: 2.6 ratio (ref 0.0–4.4)
Cholesterol, Total: 184 mg/dL (ref 100–199)
HDL: 72 mg/dL (ref >39)
LDL Chol Calc (NIH): 86 mg/dL (ref 0–99)
Triglycerides: 151 mg/dL — ABNORMAL HIGH (ref 0–149)
VLDL Cholesterol Cal: 26 mg/dL (ref 5–40)

## 2023-10-22 LAB — TSH: TSH: 2.46 u[IU]/mL (ref 0.450–4.500)

## 2023-10-22 NOTE — Telephone Encounter (Signed)
 Pharmacy Patient Advocate Encounter   Received notification from Onbase that prior authorization for Hydrocortisone 25 mg supp is required/requested.   Insurance verification completed.   The patient is insured through [employer] [place] .   Per test claim: patient will need to pay out of pocket (good rx, singlecare etc)

## 2023-10-23 NOTE — Telephone Encounter (Signed)
 Pt aware. Pt is going to contact pharmacy to see how much prescription is. If it is too expensive pt will call back to see if an alternative can be sent in instead.

## 2023-10-24 ENCOUNTER — Other Ambulatory Visit: Payer: Self-pay | Admitting: Family Medicine

## 2023-10-24 DIAGNOSIS — Z1231 Encounter for screening mammogram for malignant neoplasm of breast: Secondary | ICD-10-CM

## 2023-10-25 ENCOUNTER — Ambulatory Visit: Payer: Self-pay | Admitting: Family Medicine

## 2023-12-11 ENCOUNTER — Ambulatory Visit
Admission: RE | Admit: 2023-12-11 | Discharge: 2023-12-11 | Disposition: A | Discharge status: Home / Self Care | Source: Ambulatory Visit | Attending: Family Medicine | Admitting: Family Medicine

## 2023-12-11 DIAGNOSIS — Z1231 Encounter for screening mammogram for malignant neoplasm of breast: Secondary | ICD-10-CM

## 2023-12-16 DIAGNOSIS — X32XXXD Exposure to sunlight, subsequent encounter: Secondary | ICD-10-CM | POA: Diagnosis not present

## 2023-12-16 DIAGNOSIS — L570 Actinic keratosis: Secondary | ICD-10-CM | POA: Diagnosis not present

## 2024-01-16 ENCOUNTER — Other Ambulatory Visit: Payer: Self-pay | Admitting: Family Medicine

## 2024-01-16 DIAGNOSIS — F419 Anxiety disorder, unspecified: Secondary | ICD-10-CM

## 2024-01-29 DIAGNOSIS — B078 Other viral warts: Secondary | ICD-10-CM | POA: Diagnosis not present

## 2024-01-29 DIAGNOSIS — X32XXXD Exposure to sunlight, subsequent encounter: Secondary | ICD-10-CM | POA: Diagnosis not present

## 2024-01-29 DIAGNOSIS — L570 Actinic keratosis: Secondary | ICD-10-CM | POA: Diagnosis not present

## 2024-02-27 ENCOUNTER — Ambulatory Visit (INDEPENDENT_AMBULATORY_CARE_PROVIDER_SITE_OTHER)

## 2024-02-27 DIAGNOSIS — Z23 Encounter for immunization: Secondary | ICD-10-CM

## 2024-03-06 ENCOUNTER — Ambulatory Visit (INDEPENDENT_AMBULATORY_CARE_PROVIDER_SITE_OTHER)

## 2024-03-06 VITALS — BP 173/79 | HR 89 | Ht 64.0 in | Wt 157.0 lb

## 2024-03-06 DIAGNOSIS — Z0000 Encounter for general adult medical examination without abnormal findings: Secondary | ICD-10-CM

## 2024-03-06 NOTE — Patient Instructions (Signed)
 Shelly Nelson,  Thank you for taking the time for your Medicare Wellness Visit. I appreciate your continued commitment to your health goals. Please review the care plan we discussed, and feel free to reach out if I can assist you further.  Medicare recommends these wellness visits once per year to help you and your care team stay ahead of potential health issues. These visits are designed to focus on prevention, allowing your provider to concentrate on managing your acute and chronic conditions during your regular appointments.  Please note that Annual Wellness Visits do not include a physical exam. Some assessments may be limited, especially if the visit was conducted virtually. If needed, we may recommend a separate in-person follow-up with your provider.  Ongoing Care Seeing your primary care provider every 3 to 6 months helps us  monitor your health and provide consistent, personalized care.   Referrals If a referral was made during today's visit and you haven't received any updates within two weeks, please contact the referred provider directly to check on the status.  Recommended Screenings:  Health Maintenance  Topic Date Due   COVID-19 Vaccine (7 - 2025-26 season) 01/13/2024   Medicare Annual Wellness Visit  02/29/2024   DTaP/Tdap/Td vaccine (3 - Td or Tdap) 02/19/2033   Pneumococcal Vaccine for age over 30  Completed   Flu Shot  Completed   DEXA scan (bone density measurement)  Completed   Zoster (Shingles) Vaccine  Completed   Meningitis B Vaccine  Aged Out   Breast Cancer Screening  Discontinued       03/06/2024   12:54 PM  Advanced Directives  Does Patient Have a Medical Advance Directive? Yes  Type of Advance Directive Healthcare Power of Attorney  Copy of Healthcare Power of Attorney in Chart? No - copy requested   Advance Care Planning is important because it: Ensures you receive medical care that aligns with your values, goals, and preferences. Provides guidance to  your family and loved ones, reducing the emotional burden of decision-making during critical moments.  Vision: Annual vision screenings are recommended for early detection of glaucoma, cataracts, and diabetic retinopathy. These exams can also reveal signs of chronic conditions such as diabetes and high blood pressure.  Dental: Annual dental screenings help detect early signs of oral cancer, gum disease, and other conditions linked to overall health, including heart disease and diabetes.  Please see the attached documents for additional preventive care recommendations.

## 2024-03-06 NOTE — Progress Notes (Signed)
 Subjective:   Shelly Nelson is a 89 y.o. who presents for a Medicare Wellness preventive visit.  As a reminder, Annual Wellness Visits don't include a physical exam, and some assessments may be limited, especially if this visit is performed virtually. We may recommend an in-person follow-up visit with your provider if needed.  Visit Complete: Virtual I connected with  Lin Hackmann on 03/06/24 by a audio enabled telemedicine application and verified that I am speaking with the correct person using two identifiers.  Patient Location: Home  Provider Location: Office/Clinic  I discussed the limitations of evaluation and management by telemedicine. The patient expressed understanding and agreed to proceed.  Vital Signs: Because this visit was a virtual/telehealth visit, some criteria may be missing or patient reported. Any vitals not documented were not able to be obtained and vitals that have been documented are patient reported.  VideoDeclined- This patient declined Librarian, academic. Therefore the visit was completed with audio only.  Persons Participating in Visit: Patient.  AWV Questionnaire: No: Patient Medicare AWV questionnaire was not completed prior to this visit.  Cardiac Risk Factors include: advanced age (>36men, >63 women);dyslipidemia;hypertension     Objective:    Today's Vitals   03/06/24 1249  BP: (!) 173/79  Pulse: 89  Weight: 157 lb (71.2 kg)  Height: 5' 4 (1.626 m)   Body mass index is 26.95 kg/m.     03/06/2024   12:54 PM 03/01/2023    2:33 PM 01/26/2022    2:36 PM 01/25/2021    3:47 PM 12/09/2019    2:25 PM 10/07/2018    2:31 PM 06/09/2012   10:11 AM  Advanced Directives  Does Patient Have a Medical Advance Directive? Yes Yes No Yes Yes No Patient does not have advance directive   Type of Estate agent of [employer] of [place];Living will  Healthcare Power of [employer] Power of Attorney    Does patient want to make changes to medical advance directive?     No - Patient declined    Copy of Healthcare Power of Attorney in Chart? No - copy requested No - copy requested  No - copy requested No - copy requested    Would patient like information on creating a medical advance directive?   No - Patient declined  No - Patient declined No - Patient declined       Data saved with a previous flowsheet row definition    Current Medications (verified) Outpatient Encounter Medications as of 03/06/2024  Medication Sig   atenolol  (TENORMIN ) 25 MG tablet Take 1/2 (one-half) tablet by mouth once daily   Calcium Carbonate-Vitamin D  (CALCIUM-D) 600-400 MG-UNIT TABS Take 1 tablet by mouth daily.   conjugated estrogens  (PREMARIN ) vaginal cream INSERT 1/2 (ONE-HALF) GRAM VAGINALLY TWICE A WEEK AT BEDTIME   Garlic 1000 MG CAPS Take 1 capsule by mouth daily.   HYDROcodone-acetaminophen (NORCO) 10-325 MG tablet Take 1 tablet by mouth 3 (three) times daily as needed.   hydrocortisone  (ANUSOL -HC) 25 MG suppository Place 1 suppository (25 mg total) rectally 2 (two) times daily.   hydrOXYzine  (VISTARIL ) 25 MG capsule TAKE 1 CAPSULE BY MOUTH AT BEDTIME AS NEEDED   losartan  (COZAAR ) 25 MG tablet Take 1 tablet (25 mg total) by mouth daily.   magnesium oxide (MAG-OX) 400 MG tablet Take 400 mg by mouth 2 (two) times daily.   Multiple Vitamin (MULTIVITAMIN WITH MINERALS) TABS Take 1 tablet by mouth daily.  Omega-3 Fatty Acids (FISH OIL) 1200 MG CAPS Take 1,200 mg by mouth daily.   omeprazole  (PRILOSEC) 40 MG capsule Take 1 capsule (40 mg total) by mouth daily.   polyethylene glycol (MIRALAX / GLYCOLAX) 17 g packet Take 17 g by mouth daily.   pravastatin  (PRAVACHOL ) 40 MG tablet Take 1 tablet (40 mg total) by mouth daily.   psyllium (METAMUCIL) 58.6 % powder Take 1 packet by mouth 2 (two) times daily.   venlafaxine  XR (EFFEXOR -XR) 75 MG 24 hr capsule TAKE 1 CAPSULE BY MOUTH ONCE  DAILY WITH BREAKFAST   amoxicillin  (AMOXIL ) 500 MG capsule Take 500 mg by mouth every 8 (eight) hours. (Patient not taking: Reported on 03/06/2024)   No facility-administered encounter medications on file as of 03/06/2024.    Allergies (verified) Cephalexin   History: Past Medical History:  Diagnosis Date   Allergy    Anxiety    Arthritis    Basal cell carcinoma    GERD (gastroesophageal reflux disease) 2012   Hypercholesteremia 2000   Hypertension 2000   Spinal stenosis, lumbar region, without neurogenic claudication 2012   Squamous cell carcinoma    Thyroid  disease 2013   thyroid  nodule with needle aspiration   Past Surgical History:  Procedure Laterality Date   BACK SURGERY  11/08/2010   lumbar-bulging disc   CATARACT EXTRACTION     left eye-2011-Dr Hunt   CATARACT EXTRACTION W/PHACO  06/09/2012   Procedure: CATARACT EXTRACTION PHACO AND INTRAOCULAR LENS PLACEMENT (IOC);  Surgeon: Cherene Mania, MD;  Location: [facility];  Service: Ophthalmology;  Laterality: Left;  CDE=13.91   skin cancer removal N/A 09/10/13   THYROID  LOBECTOMY  age 81   TONSILLECTOMY     age 74   TUBAL LIGATION     Family History  Problem Relation Age of Onset   Hypertension Mother    Anxiety disorder Mother    Breast cancer Mother 38       metasis to lung and bones   Hypertension Father    COPD Father    Hypertension Maternal Grandmother    Diabetes Maternal Grandmother        type 2   Hypertension Paternal Grandmother    Heart disease Paternal Grandmother    Stroke Maternal Grandfather    COPD Paternal Grandfather    Breast cancer Maternal Aunt    Cancer Maternal Uncle        X 5 with various cancers   Social History   Socioeconomic History   Marital status: Widowed    Spouse name: Elsie   Number of children: 1   Years of education: Not on file   Highest education level: High school graduate  Occupational History    Employer: RETIRED  Tobacco Use   Smoking status: Never   Smokeless  tobacco: Never  Vaping Use   Vaping status: Never Used  Substance and Sexual Activity   Alcohol use: No   Drug use: No   Sexual activity: Not Currently    Birth control/protection: Post-menopausal, Surgical    Comment: BTL  Other Topics Concern   Not on file  Social History Narrative   Lives in one level home alone   Daughter and grandchildren all live very close   Social Drivers of Health   Financial Resource Strain: Low Risk  (03/06/2024)   Overall Financial Resource Strain (CARDIA)    Difficulty of Paying Living Expenses: Not hard at all  Food Insecurity: No Food Insecurity (03/06/2024)   Hunger Vital Sign  Worried About Programme researcher, broadcasting/film/video in the Last Year: Never true    Ran Out of Food in the Last Year: Never true  Transportation Needs: No Transportation Needs (03/06/2024)   PRAPARE - Administrator, Civil Service (Medical): No    Lack of Transportation (Non-Medical): No  Physical Activity: Inactive (03/06/2024)   Exercise Vital Sign    Days of Exercise per Week: 0 days    Minutes of Exercise per Session: 0 min  Stress: No Stress Concern Present (03/06/2024)   [employer] of Occupational Health - Occupational Stress Questionnaire    Feeling of Stress: Not at all  Social Connections: Moderately Isolated (03/06/2024)   Social Connection and Isolation Panel    Frequency of Communication with Friends and Family: More than three times a week    Frequency of Social Gatherings with Friends and Family: More than three times a week    Attends Religious Services: More than 4 times per year    Active Member of [employer] or Organizations: No    Attends Banker Meetings: Never    Marital Status: Widowed    Tobacco Counseling Counseling given: Yes    Clinical Intake:  Pre-visit preparation completed: Yes  Pain : No/denies pain     BMI - recorded: 26.95 Nutritional Status: BMI 25 -29 Overweight Nutritional Risks: None Diabetes: No  Lab  Results  Component Value Date   HGBA1C 5.9 09/11/2016     How often do you need to have someone help you when you read instructions, pamphlets, or other written materials from your doctor or pharmacy?: 1 - Never  Interpreter Needed?: No  Information entered by :: alia t/cma   Activities of Daily Living     03/06/2024   12:51 PM  In your present state of health, do you have any difficulty performing the following activities:  Hearing? 1  Vision? 0  Difficulty concentrating or making decisions? 0  Walking or climbing stairs? 1  Dressing or bathing? 0  Doing errands, shopping? 0  Preparing Food and eating ? Y  Comment pt's daughter  Using the Toilet? N  In the past six months, have you accidently leaked urine? Y  Do you have problems with loss of bowel control? N  Managing your Medications? N  Managing your Finances? N  Housekeeping or managing your Housekeeping? Y  Comment pt's daughter    Patient Care Team: Dettinger, Fonda LABOR, MD as PCP - General (Family Medicine) Gaston Hamilton, MD as Attending Physician (Urology) Dyane Rush, MD (Inactive) as Attending Physician (Gastroenterology) Romine, Montie SQUIBB, MD as Attending Physician (Obstetrics and Gynecology)  I have updated your Care Teams any recent Medical Services you may have received from other providers in the past year.     Assessment:   This is a routine wellness examination for Shelly Nelson.  Hearing/Vision screen Hearing Screening - Comments:: Pt have hearing dif Vision Screening - Comments:: Pt wear glasses/pt goes to [facility] in [place],/last ov 2025   Goals Addressed             This Visit's Progress    Patient Stated   On track    Hopes to maintain her independence        Depression Screen     03/06/2024   12:54 PM 10/21/2023   10:49 AM 04/22/2023   10:40 AM 03/01/2023    2:32 PM 10/19/2022    9:30 AM 04/19/2022    9:28 AM 01/26/2022    2:34  PM  PHQ 2/9 Scores  PHQ - 2 Score 0 0 0 0 0 0 0     Fall Risk     03/06/2024   12:47 PM 10/21/2023   10:49 AM 04/22/2023   10:40 AM 03/01/2023    2:31 PM 10/19/2022    9:30 AM  Fall Risk   Falls in the past year? 1 1 0 0 0  Number falls in past yr: 0 0  0   Injury with Fall? 0 0  0   Risk for fall due to : Impaired balance/gait;Impaired mobility Impaired balance/gait  No Fall Risks   Follow up Falls evaluation completed;Education provided Falls evaluation completed  Falls prevention discussed     MEDICARE RISK AT HOME:  Medicare Risk at Home Any stairs in or around the home?: No If so, are there any without handrails?: No Home free of loose throw rugs in walkways, pet beds, electrical cords, etc?: Yes Adequate lighting in your home to reduce risk of falls?: Yes Life alert?: No Use of a cane, walker or w/c?: Yes Grab bars in the bathroom?: No Shower chair or bench in shower?: Yes Elevated toilet seat or a handicapped toilet?: Yes  TIMED UP AND GO:  Was the test performed?  no  Cognitive Function: 6CIT completed        03/06/2024   12:53 PM 03/01/2023    2:33 PM 01/26/2022    2:37 PM 12/09/2019    2:27 PM 10/07/2018    2:34 PM  6CIT Screen  What Year? 0 points 0 points 0 points 0 points 0 points  What month? 0 points 0 points 0 points 0 points 0 points  What time? 0 points 0 points 0 points 0 points 0 points  Count back from 20 0 points 0 points 0 points 0 points 0 points  Months in reverse 0 points 0 points 0 points 0 points 0 points  Repeat phrase 0 points 0 points 2 points 0 points 0 points  Total Score 0 points 0 points 2 points 0 points 0 points    Immunizations Immunization History  Administered Date(s) Administered   Fluad Quad(high Dose 65+) 02/27/2019, 02/19/2020, 02/21/2021, 03/12/2022   Fluad Trivalent(High Dose 65+) 02/20/2023   INFLUENZA, HIGH DOSE SEASONAL PF 02/08/2017, 02/20/2018, 02/27/2024   Influenza,inj,Quad PF,6+ Mos 02/10/2013, 02/18/2014, 02/16/2015, 02/02/2016   Moderna Covid-19 Vaccine  Bivalent Booster 43yrs & up 03/22/2021   Moderna Sars-Covid-2 Vaccination 06/01/2019, 06/29/2019, 03/23/2020, 09/14/2020   Pneumococcal Conjugate-13 02/18/2014   Pneumococcal Polysaccharide-23 05/14/2005   Pneumococcal-Unspecified 01/27/2021   Tdap 09/10/2012, 02/20/2023   Unspecified SARS-COV-2 Vaccination 01/18/2021   Zoster Recombinant(Shingrix ) 10/19/2020, 10/18/2021   Zoster, Live 02/12/2008    Screening Tests Health Maintenance  Topic Date Due   COVID-19 Vaccine (7 - 2025-26 season) 01/13/2024   Medicare Annual Wellness (AWV)  03/06/2025   DTaP/Tdap/Td (3 - Td or Tdap) 02/19/2033   Pneumococcal Vaccine: 50+ Years  Completed   Influenza Vaccine  Completed   DEXA SCAN  Completed   Zoster Vaccines- Shingrix   Completed   Meningococcal B Vaccine  Aged Out   Mammogram  Discontinued    Health Maintenance Items Addressed: See Nurse Notes at the end of this note  Additional Screening:  Vision Screening: Recommended annual ophthalmology exams for early detection of glaucoma and other disorders of the eye. Is the patient up to date with their annual eye exam?  Yes  Who is the provider or what is the name of the office in  which the patient attends annual eye exams? [facility] in [facility]  Dental Screening: Recommended annual dental exams for proper oral hygiene  Community Resource Referral / Chronic Care Management: CRR required this visit?  No   CCM required this visit?  No   Plan:    I have personally reviewed and noted the following in the patient's chart:   Medical and social history Use of alcohol, tobacco or illicit drugs  Current medications and supplements including opioid prescriptions. Patient is not currently taking opioid prescriptions. Functional ability and status Nutritional status Physical activity Advanced directives List of other physicians Hospitalizations, surgeries, and ER visits in previous 12 months Vitals Screenings to include cognitive,  depression, and falls Referrals and appointments  In addition, I have reviewed and discussed with patient certain preventive protocols, quality metrics, and best practice recommendations. A written personalized care plan for preventive services as well as general preventive health recommendations were provided to patient.   Ozie Ned, CMA   03/06/2024   After Visit Summary: (MyChart) Due to this being a telephonic visit, the after visit summary with patients personalized plan was offered to patient via MyChart   Notes: Nothing significant to report at this time.

## 2024-04-19 ENCOUNTER — Other Ambulatory Visit: Payer: Self-pay | Admitting: Family Medicine

## 2024-04-19 DIAGNOSIS — I10 Essential (primary) hypertension: Secondary | ICD-10-CM

## 2024-04-22 ENCOUNTER — Encounter: Payer: Self-pay | Admitting: Family Medicine

## 2024-04-22 ENCOUNTER — Ambulatory Visit: Payer: Self-pay | Admitting: Family Medicine

## 2024-04-22 VITALS — BP 172/72 | HR 72 | Ht 64.0 in | Wt 154.0 lb

## 2024-04-22 DIAGNOSIS — I10 Essential (primary) hypertension: Secondary | ICD-10-CM

## 2024-04-22 DIAGNOSIS — M1712 Unilateral primary osteoarthritis, left knee: Secondary | ICD-10-CM

## 2024-04-22 DIAGNOSIS — E7800 Pure hypercholesterolemia, unspecified: Secondary | ICD-10-CM

## 2024-04-22 DIAGNOSIS — E039 Hypothyroidism, unspecified: Secondary | ICD-10-CM

## 2024-04-22 DIAGNOSIS — K219 Gastro-esophageal reflux disease without esophagitis: Secondary | ICD-10-CM

## 2024-04-22 DIAGNOSIS — N952 Postmenopausal atrophic vaginitis: Secondary | ICD-10-CM | POA: Diagnosis not present

## 2024-04-22 DIAGNOSIS — F419 Anxiety disorder, unspecified: Secondary | ICD-10-CM | POA: Diagnosis not present

## 2024-04-22 MED ORDER — VENLAFAXINE HCL ER 75 MG PO CP24: ORAL_CAPSULE | ORAL | 1 refills | Status: AC

## 2024-04-22 MED ORDER — LOSARTAN POTASSIUM 25 MG PO TABS: 25.0000 mg | ORAL_TABLET | Freq: Every day | ORAL | 3 refills | Status: AC

## 2024-04-22 MED ORDER — METHYLPREDNISOLONE ACETATE 80 MG/ML IJ SUSP
80.0000 mg | Freq: Once | INTRAMUSCULAR | Status: AC
  Administered 2024-04-22: 80 mg via INTRA_ARTICULAR

## 2024-04-22 MED ORDER — OMEPRAZOLE 40 MG PO CPDR: 40.0000 mg | DELAYED_RELEASE_CAPSULE | Freq: Every day | ORAL | 3 refills | Status: AC

## 2024-04-22 MED ORDER — ATENOLOL 25 MG PO TABS: ORAL_TABLET | ORAL | 3 refills | Status: AC

## 2024-04-22 MED ORDER — PREMARIN 0.625 MG/GM VA CREA: TOPICAL_CREAM | VAGINAL | 1 refills | Status: AC

## 2024-04-22 MED ORDER — PRAVASTATIN SODIUM 40 MG PO TABS: 40.0000 mg | ORAL_TABLET | Freq: Every day | ORAL | 3 refills | Status: AC

## 2024-04-22 NOTE — Progress Notes (Signed)
 BP (!) 172/72   Pulse 72   Ht 5' 4 (1.626 m)   Wt 154 lb (69.9 kg)   LMP 05/14/2001 (Approximate)   SpO2 95%   BMI 26.43 kg/m    Subjective:   Patient ID: Shelly Nelson, female    DOB: [birth year], 89 y.o.   MRN: [id number]  HPI: Shelly Nelson is a 89 y.o. female presenting on 04/22/2024 for Medical Management of Chronic Issues, Hyperlipidemia, Hypertension, and Hypothyroidism   Discussed the use of AI scribe software for clinical note transcription with the patient, who gave verbal consent to proceed.  History of Present Illness   Shelly Nelson is a 89 year old female with hypertension who presents for a recheck of her blood pressure and knee pain.  Hypertension and blood pressure variability - Elevated blood pressure, particularly in the mornings before taking antihypertensive medication - Blood pressure decreases after taking morning medication - Occasional elevated blood pressure during the day, especially around 5 PM when feeling tired - Current antihypertensive regimen includes losartan  and half of atenolol  at bedtime - Also taking Effexor  daily and a cholesterol medication in the evening  Chronic knee pain - Worsening bilateral knee pain significantly limiting participation in activities, including attending Thanksgiving lunch - History of knee injections, with one injection providing relief despite initial discomfort from pressure - Knees are less swollen than previously but remain painful - Uncertain about the presence of joint effusion  Cutaneous symptoms and topical treatment - Chronic skin issues without significant sun exposure in the past 30 years - Recent use of 5-fluorouracil cream once daily, initially causing redness and swelling in the ankles - Subsequent use of a soothing cream improved skin condition - No use of topical treatment for approximately three weeks          Relevant past medical, surgical, family and social  history reviewed and updated as indicated. Interim medical history since our last visit reviewed. Allergies and medications reviewed and updated.  Review of Systems  Constitutional:  Negative for chills and fever.  Eyes:  Negative for visual disturbance.  Respiratory:  Negative for chest tightness and shortness of breath.   Cardiovascular:  Negative for chest pain and leg swelling.  Musculoskeletal:  Positive for arthralgias and joint swelling. Negative for back pain, gait problem, myalgias and neck pain.  Skin:  Negative for color change and rash.  Neurological:  Negative for light-headedness and headaches.  Psychiatric/Behavioral:  Negative for agitation and behavioral problems.   All other systems reviewed and are negative.   Per HPI unless specifically indicated above   Allergies as of 04/22/2024       Reactions   Cephalexin Hives        Medication List        Accurate as of April 22, 2024 10:55 AM. If you have any questions, ask your nurse or doctor.          STOP taking these medications    amoxicillin  500 MG capsule Commonly known as: AMOXIL  Stopped by: Fonda LABOR Yoshiye Kraft       TAKE these medications    atenolol  25 MG tablet Commonly known as: TENORMIN  Take 1/2 (one-half) tablet by mouth once daily   Calcium-D 600-400 MG-UNIT Tabs Take 1 tablet by mouth daily.   Fish Oil 1200 MG Caps Take 1,200 mg by mouth daily.   Garlic 1000 MG Caps Take 1 capsule by mouth daily.   HYDROcodone-acetaminophen 10-325 MG tablet Commonly known as: NORCO  Take 1 tablet by mouth 3 (three) times daily as needed.   hydrocortisone  25 MG suppository Commonly known as: ANUSOL -HC Place 1 suppository (25 mg total) rectally 2 (two) times daily.   hydrOXYzine  25 MG capsule Commonly known as: VISTARIL  TAKE 1 CAPSULE BY MOUTH AT BEDTIME AS NEEDED   losartan  25 MG tablet Commonly known as: COZAAR  Take 1 tablet (25 mg total) by mouth daily.   magnesium oxide 400 MG  tablet Commonly known as: MAG-OX Take 400 mg by mouth 2 (two) times daily.   multivitamin with minerals Tabs tablet Take 1 tablet by mouth daily.   omeprazole  40 MG capsule Commonly known as: PRILOSEC Take 1 capsule (40 mg total) by mouth daily.   polyethylene glycol 17 g packet Commonly known as: MIRALAX / GLYCOLAX Take 17 g by mouth daily.   pravastatin  40 MG tablet Commonly known as: PRAVACHOL  Take 1 tablet (40 mg total) by mouth daily.   Premarin  vaginal cream Generic drug: conjugated estrogens  INSERT 1/2 (ONE-HALF) GRAM VAGINALLY TWICE A WEEK AT BEDTIME   psyllium 58.6 % powder Commonly known as: METAMUCIL Take 1 packet by mouth 2 (two) times daily.   venlafaxine  XR 75 MG 24 hr capsule Commonly known as: EFFEXOR -XR TAKE 1 CAPSULE BY MOUTH ONCE DAILY WITH BREAKFAST         Objective:   BP (!) 172/72   Pulse 72   Ht 5' 4 (1.626 m)   Wt 154 lb (69.9 kg)   LMP 05/14/2001 (Approximate)   SpO2 95%   BMI 26.43 kg/m   Wt Readings from Last 3 Encounters:  04/22/24 154 lb (69.9 kg)  03/06/24 157 lb (71.2 kg)  10/21/23 157 lb (71.2 kg)    Physical Exam Vitals and nursing note reviewed.    Physical Exam   NECK: Thyroid  normal, no nodules. CHEST: Lungs clear to auscultation bilaterally. CARDIOVASCULAR: Heart regular rate and rhythm, no murmurs.       Knee injection: Consent form signed. Risk factors of bleeding and infection discussed with patient and patient is agreeable towards injection. Patient prepped with Betadine . Lateral approach towards injection used. Injected 80 mg of Depo-Medrol  and 1 mL of 2% lidocaine . Patient tolerated procedure well and no side effects from noted. Minimal to no bleeding. Simple bandage applied after.   Assessment & Plan:   Problem List Items Addressed This Visit       Cardiovascular and Mediastinum   White coat syndrome with diagnosis of hypertension   Relevant Medications   atenolol  (TENORMIN ) 25 MG tablet   losartan   (COZAAR ) 25 MG tablet   pravastatin  (PRAVACHOL ) 40 MG tablet   Other Relevant Orders   CBC with Differential/Platelet   CMP14+EGFR   Essential hypertension   Relevant Medications   atenolol  (TENORMIN ) 25 MG tablet   losartan  (COZAAR ) 25 MG tablet   pravastatin  (PRAVACHOL ) 40 MG tablet   Other Relevant Orders   CBC with Differential/Platelet   CMP14+EGFR   Lipid panel     Digestive   GERD (gastroesophageal reflux disease)   Relevant Medications   omeprazole  (PRILOSEC) 40 MG capsule     Endocrine   Hypothyroid - Primary   Relevant Medications   atenolol  (TENORMIN ) 25 MG tablet   Other Relevant Orders   TSH     Other   HLD (hyperlipidemia)   Relevant Medications   atenolol  (TENORMIN ) 25 MG tablet   losartan  (COZAAR ) 25 MG tablet   pravastatin  (PRAVACHOL ) 40 MG tablet   Other Relevant Orders  CBC with Differential/Platelet   CMP14+EGFR   Lipid panel   Anxiety   Relevant Medications   venlafaxine  XR (EFFEXOR -XR) 75 MG 24 hr capsule   Other Visit Diagnoses       Post-menopausal atrophic vaginitis       Relevant Medications   conjugated estrogens  (PREMARIN ) vaginal cream     Primary osteoarthritis of left knee       Relevant Medications   methylPREDNISolone  acetate (DEPO-MEDROL ) injection 80 mg (Start on 04/22/2024 11:00 AM)          Osteoarthritis of left knee Chronic osteoarthritis with worsening symptoms and fluid accumulation. Previous injections provided relief but caused post-injection pain. - Administered injection to the left knee today.  Essential hypertension Blood pressure generally well-controlled with losartan  and atenolol , occasional afternoon elevations. - Continue losartan  and atenolol . - Monitor blood pressure regularly.  Pure hypercholesterolemia Cholesterol levels managed with evening medication. - Continue current cholesterol medication regimen.  Gastroesophageal reflux disease GERD symptoms well-controlled with omeprazole . - Continue  omeprazole  as prescribed.  Acquired hypothyroidism Thyroid  function stable.  Actinic keratosis Improvement noted after 5-fluorouracil cream treatment. - Continue follow-up with dermatologist.          Follow up plan: Return in about 6 months (around 10/21/2024), or if symptoms worsen or fail to improve, for Hypertension and hyperlipidemia and hypothyroidism.  Counseling provided for all of the vaccine components Orders Placed This Encounter  Procedures   CBC with Differential/Platelet   CMP14+EGFR   Lipid panel   TSH    Fonda Levins, MD [facility] 04/22/2024, 10:55 AM

## 2024-04-23 LAB — LIPID PANEL
Chol/HDL Ratio: 2.7 ratio (ref 0.0–4.4)
Cholesterol, Total: 170 mg/dL (ref 100–199)
HDL: 63 mg/dL (ref >39)
LDL Chol Calc (NIH): 87 mg/dL (ref 0–99)
Triglycerides: 111 mg/dL (ref 0–149)
VLDL Cholesterol Cal: 20 mg/dL (ref 5–40)

## 2024-04-23 LAB — CMP14+EGFR
ALT: 15 IU/L (ref 0–32)
AST: 33 IU/L (ref 0–40)
Albumin: 4.4 g/dL (ref 3.6–4.6)
Alkaline Phosphatase: 68 IU/L (ref 48–129)
BUN/Creatinine Ratio: 17 (ref 12–28)
BUN: 22 mg/dL (ref 10–36)
Bilirubin Total: 0.3 mg/dL (ref 0.0–1.2)
CO2: 25 mmol/L (ref 20–29)
Calcium: 9.4 mg/dL (ref 8.7–10.3)
Chloride: 102 mmol/L (ref 96–106)
Creatinine, Ser: 1.28 mg/dL — ABNORMAL HIGH (ref 0.57–1.00)
Globulin, Total: 2.1 g/dL (ref 1.5–4.5)
Glucose: 102 mg/dL — ABNORMAL HIGH (ref 70–99)
Potassium: 4.2 mmol/L (ref 3.5–5.2)
Sodium: 141 mmol/L (ref 134–144)
Total Protein: 6.5 g/dL (ref 6.0–8.5)
eGFR: 39 mL/min/1.73 — ABNORMAL LOW (ref >59)

## 2024-04-23 LAB — CBC WITH DIFFERENTIAL/PLATELET
Basophils Absolute: 0.1 x10E3/uL (ref 0.0–0.2)
Basos: 1 %
EOS (ABSOLUTE): 0.5 x10E3/uL — ABNORMAL HIGH (ref 0.0–0.4)
Eos: 6 %
Hematocrit: 37.3 % (ref 34.0–46.6)
Hemoglobin: 12.3 g/dL (ref 11.1–15.9)
Immature Grans (Abs): 0 x10E3/uL (ref 0.0–0.1)
Immature Granulocytes: 0 %
Lymphocytes Absolute: 1.9 x10E3/uL (ref 0.7–3.1)
Lymphs: 21 %
MCH: 31.5 pg (ref 26.6–33.0)
MCHC: 33 g/dL (ref 31.5–35.7)
MCV: 96 fL (ref 79–97)
Monocytes Absolute: 0.5 x10E3/uL (ref 0.1–0.9)
Monocytes: 6 %
Neutrophils Absolute: 5.9 x10E3/uL (ref 1.4–7.0)
Neutrophils: 66 %
Platelets: 277 x10E3/uL (ref 150–450)
RBC: 3.9 x10E6/uL (ref 3.77–5.28)
RDW: 12.1 % (ref 11.7–15.4)
WBC: 8.9 x10E3/uL (ref 3.4–10.8)

## 2024-04-23 LAB — TSH: TSH: 1.8 u[IU]/mL (ref 0.450–4.500)

## 2024-04-29 ENCOUNTER — Ambulatory Visit: Payer: Self-pay | Admitting: Family Medicine

## 2024-10-21 ENCOUNTER — Ambulatory Visit: Admitting: Family Medicine
